# Patient Record
Sex: Male | Born: 1945 | Race: White | Hispanic: No | Marital: Married | Smoking: Former smoker
Health system: Southern US, Community
[De-identification: ages and names within clinical notes are randomized; demographics above are authoritative.]

## PROBLEM LIST (undated history)

## (undated) DIAGNOSIS — C801 Malignant (primary) neoplasm, unspecified: Secondary | ICD-10-CM

## (undated) DIAGNOSIS — T7840XA Allergy, unspecified, initial encounter: Secondary | ICD-10-CM

## (undated) DIAGNOSIS — R29898 Other symptoms and signs involving the musculoskeletal system: Secondary | ICD-10-CM

## (undated) DIAGNOSIS — M549 Dorsalgia, unspecified: Secondary | ICD-10-CM

## (undated) DIAGNOSIS — R200 Anesthesia of skin: Secondary | ICD-10-CM

## (undated) DIAGNOSIS — I10 Essential (primary) hypertension: Secondary | ICD-10-CM

## (undated) HISTORY — DX: Other symptoms and signs involving the musculoskeletal system: R29.898

## (undated) HISTORY — DX: Allergy, unspecified, initial encounter: T78.40XA

## (undated) HISTORY — DX: Anesthesia of skin: R20.0

## (undated) HISTORY — DX: Dorsalgia, unspecified: M54.9

## (undated) HISTORY — PX: COLON SURGERY: SHX602

---

## 1999-01-05 ENCOUNTER — Ambulatory Visit (HOSPITAL_COMMUNITY): Admission: RE | Admit: 1999-01-05 | Discharge: 1999-01-05 | Payer: Self-pay | Admitting: Gastroenterology

## 1999-01-20 ENCOUNTER — Encounter: Payer: Self-pay | Admitting: General Surgery

## 1999-01-22 ENCOUNTER — Ambulatory Visit (HOSPITAL_COMMUNITY): Admission: RE | Admit: 1999-01-22 | Discharge: 1999-01-22 | Payer: Self-pay | Admitting: Gastroenterology

## 1999-01-23 ENCOUNTER — Inpatient Hospital Stay (HOSPITAL_COMMUNITY): Admission: RE | Admit: 1999-01-23 | Discharge: 1999-01-27 | Payer: Self-pay | Admitting: General Surgery

## 2001-11-03 ENCOUNTER — Ambulatory Visit (HOSPITAL_COMMUNITY): Admission: RE | Admit: 2001-11-03 | Discharge: 2001-11-03 | Payer: Self-pay | Admitting: Gastroenterology

## 2001-12-18 ENCOUNTER — Encounter: Payer: Self-pay | Admitting: Cardiology

## 2001-12-18 ENCOUNTER — Ambulatory Visit (HOSPITAL_COMMUNITY): Admission: RE | Admit: 2001-12-18 | Discharge: 2001-12-18 | Payer: Self-pay | Admitting: Cardiology

## 2011-12-18 ENCOUNTER — Encounter (HOSPITAL_COMMUNITY): Payer: Self-pay | Admitting: *Deleted

## 2011-12-18 ENCOUNTER — Emergency Department (HOSPITAL_COMMUNITY)
Admission: EM | Admit: 2011-12-18 | Discharge: 2011-12-18 | Disposition: A | Discharge status: Home / Self Care | Payer: No Typology Code available for payment source | Attending: Emergency Medicine | Admitting: Emergency Medicine

## 2011-12-18 ENCOUNTER — Emergency Department (HOSPITAL_COMMUNITY): Payer: No Typology Code available for payment source

## 2011-12-18 DIAGNOSIS — S0003XA Contusion of scalp, initial encounter: Secondary | ICD-10-CM | POA: Insufficient documentation

## 2011-12-18 DIAGNOSIS — R51 Headache: Secondary | ICD-10-CM | POA: Insufficient documentation

## 2011-12-18 DIAGNOSIS — J3489 Other specified disorders of nose and nasal sinuses: Secondary | ICD-10-CM | POA: Insufficient documentation

## 2011-12-18 DIAGNOSIS — R404 Transient alteration of awareness: Secondary | ICD-10-CM | POA: Insufficient documentation

## 2011-12-18 DIAGNOSIS — F29 Unspecified psychosis not due to a substance or known physiological condition: Secondary | ICD-10-CM | POA: Insufficient documentation

## 2011-12-18 DIAGNOSIS — R413 Other amnesia: Secondary | ICD-10-CM | POA: Insufficient documentation

## 2011-12-18 LAB — URINALYSIS, ROUTINE W REFLEX MICROSCOPIC
Bilirubin Urine: NEGATIVE
Glucose, UA: NEGATIVE mg/dL
Ketones, ur: NEGATIVE mg/dL
pH: 6.5 (ref 5.0–8.0)

## 2011-12-18 LAB — POCT I-STAT, CHEM 8
BUN: 18 mg/dL (ref 6–23)
Calcium, Ion: 1.08 mmol/L — ABNORMAL LOW (ref 1.12–1.32)
Chloride: 106 mEq/L (ref 96–112)
Glucose, Bld: 110 mg/dL — ABNORMAL HIGH (ref 70–99)

## 2011-12-18 LAB — PROTIME-INR: INR: 1.03 (ref 0.00–1.49)

## 2011-12-18 LAB — CBC
HCT: 40.7 % (ref 39.0–52.0)
Hemoglobin: 14.2 g/dL (ref 13.0–17.0)
MCH: 32.6 pg (ref 26.0–34.0)
MCV: 93.6 fL (ref 78.0–100.0)
RBC: 4.35 MIL/uL (ref 4.22–5.81)

## 2011-12-18 LAB — COMPREHENSIVE METABOLIC PANEL
ALT: 35 U/L (ref 0–53)
CO2: 26 mEq/L (ref 19–32)
Calcium: 8.6 mg/dL (ref 8.4–10.5)
GFR calc Af Amer: >90 mL/min (ref >90)
GFR calc non Af Amer: >90 mL/min (ref >90)
Glucose, Bld: 111 mg/dL — ABNORMAL HIGH (ref 70–99)
Sodium: 140 mEq/L (ref 135–145)

## 2011-12-18 MED ORDER — SODIUM CHLORIDE 0.9 % IV BOLUS (SEPSIS): 1000.0000 mL | Freq: Once | INTRAVENOUS | Status: DC

## 2011-12-18 MED ORDER — TETANUS-DIPHTHERIA TOXOIDS TD 5-2 LFU IM INJ
0.5000 mL | INJECTION | Freq: Once | INTRAMUSCULAR | Status: AC
  Administered 2011-12-18: 0.5 mL via INTRAMUSCULAR
  Filled 2011-12-18 (×3): qty 0.5

## 2011-12-18 MED ORDER — SODIUM CHLORIDE 0.9 % IV BOLUS (SEPSIS)
1000.0000 mL | Freq: Once | INTRAVENOUS | Status: AC
  Administered 2011-12-18: 1000 mL via INTRAVENOUS
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
## 2014-06-12 ENCOUNTER — Ambulatory Visit (INDEPENDENT_AMBULATORY_CARE_PROVIDER_SITE_OTHER): Payer: Self-pay | Admitting: Family Medicine

## 2014-06-12 VITALS — BP 124/80 | HR 88 | Temp 97.4°F | Resp 16 | Ht 74.0 in | Wt 216.6 lb

## 2014-06-12 DIAGNOSIS — I10 Essential (primary) hypertension: Secondary | ICD-10-CM | POA: Insufficient documentation

## 2014-06-12 DIAGNOSIS — Z0289 Encounter for other administrative examinations: Secondary | ICD-10-CM

## 2014-06-12 DIAGNOSIS — Z024 Encounter for examination for driving license: Secondary | ICD-10-CM

## 2014-06-12 DIAGNOSIS — E663 Overweight: Secondary | ICD-10-CM

## 2014-06-12 NOTE — Patient Instructions (Signed)
Continue to follow-up with Dr. Rex Kras as directed.   Continue to avoid alcohol prior to driving.
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
## 2015-07-20 ENCOUNTER — Emergency Department (HOSPITAL_COMMUNITY): Payer: Medicare Other

## 2015-07-20 ENCOUNTER — Encounter (HOSPITAL_COMMUNITY): Payer: Self-pay

## 2015-07-20 ENCOUNTER — Emergency Department (HOSPITAL_COMMUNITY)
Admission: EM | Admit: 2015-07-20 | Discharge: 2015-07-21 | Disposition: A | Discharge status: Home / Self Care | Payer: Medicare Other | Attending: Emergency Medicine | Admitting: Emergency Medicine

## 2015-07-20 DIAGNOSIS — Y92007 Garden or yard of unspecified non-institutional (private) residence as the place of occurrence of the external cause: Secondary | ICD-10-CM | POA: Insufficient documentation

## 2015-07-20 DIAGNOSIS — S80212A Abrasion, left knee, initial encounter: Secondary | ICD-10-CM | POA: Diagnosis not present

## 2015-07-20 DIAGNOSIS — W1839XA Other fall on same level, initial encounter: Secondary | ICD-10-CM | POA: Insufficient documentation

## 2015-07-20 DIAGNOSIS — S80211A Abrasion, right knee, initial encounter: Secondary | ICD-10-CM | POA: Diagnosis not present

## 2015-07-20 DIAGNOSIS — S30811A Abrasion of abdominal wall, initial encounter: Secondary | ICD-10-CM | POA: Diagnosis not present

## 2015-07-20 DIAGNOSIS — S8991XA Unspecified injury of right lower leg, initial encounter: Secondary | ICD-10-CM | POA: Diagnosis present

## 2015-07-20 DIAGNOSIS — Z79899 Other long term (current) drug therapy: Secondary | ICD-10-CM | POA: Insufficient documentation

## 2015-07-20 DIAGNOSIS — I10 Essential (primary) hypertension: Secondary | ICD-10-CM | POA: Insufficient documentation

## 2015-07-20 DIAGNOSIS — Y998 Other external cause status: Secondary | ICD-10-CM | POA: Diagnosis not present

## 2015-07-20 DIAGNOSIS — Y9389 Activity, other specified: Secondary | ICD-10-CM | POA: Diagnosis not present

## 2015-07-20 DIAGNOSIS — Z85038 Personal history of other malignant neoplasm of large intestine: Secondary | ICD-10-CM | POA: Diagnosis not present

## 2015-07-20 DIAGNOSIS — W19XXXA Unspecified fall, initial encounter: Secondary | ICD-10-CM

## 2015-07-20 HISTORY — DX: Essential (primary) hypertension: I10

## 2015-07-20 HISTORY — DX: Malignant (primary) neoplasm, unspecified: C80.1

## 2015-07-20 LAB — CBC
HEMATOCRIT: 42.1 % (ref 39.0–52.0)
HEMOGLOBIN: 13.9 g/dL (ref 13.0–17.0)
MCH: 30.5 pg (ref 26.0–34.0)
MCHC: 33 g/dL (ref 30.0–36.0)
MCV: 92.3 fL (ref 78.0–100.0)
Platelets: 162 10*3/uL (ref 150–400)
RBC: 4.56 MIL/uL (ref 4.22–5.81)
RDW: 15.2 % (ref 11.5–15.5)
WBC: 11.2 10*3/uL — ABNORMAL HIGH (ref 4.0–10.5)

## 2015-07-20 LAB — URINALYSIS, ROUTINE W REFLEX MICROSCOPIC
BILIRUBIN URINE: NEGATIVE
Glucose, UA: NEGATIVE mg/dL
HGB URINE DIPSTICK: NEGATIVE
Ketones, ur: NEGATIVE mg/dL
Leukocytes, UA: NEGATIVE
NITRITE: NEGATIVE
PH: 5 (ref 5.0–8.0)
Protein, ur: NEGATIVE mg/dL
SPECIFIC GRAVITY, URINE: 1.015 (ref 1.005–1.030)
UROBILINOGEN UA: 0.2 mg/dL (ref 0.0–1.0)

## 2015-07-20 LAB — BASIC METABOLIC PANEL
ANION GAP: 12 (ref 5–15)
BUN: 18 mg/dL (ref 6–20)
CHLORIDE: 105 mmol/L (ref 101–111)
CO2: 24 mmol/L (ref 22–32)
Calcium: 8.9 mg/dL (ref 8.9–10.3)
Creatinine, Ser: 0.89 mg/dL (ref 0.61–1.24)
GFR calc Af Amer: >60 mL/min (ref >60)
GLUCOSE: 103 mg/dL — AB (ref 65–99)
POTASSIUM: 3.7 mmol/L (ref 3.5–5.1)
Sodium: 141 mmol/L (ref 135–145)

## 2015-07-20 NOTE — ED Notes (Signed)
Pt scooted himself to the edge of the wheelchair with his legs to use the urinal without assisstance.

## 2015-07-20 NOTE — ED Notes (Addendum)
Updated pt and family on results and delay. Family expressing concern over the fact that he "can't walk and can't do anything on his own"

## 2015-07-20 NOTE — ED Notes (Signed)
Pt states he got up off his mower today and both his legs gave out on him. This normally doesn't happen but this is the second time it's happened. States he doesn't feel weak but when he tries to stand up his legs won't hold him up.

## 2015-07-21 NOTE — ED Provider Notes (Signed)
CSN: [id number]     Arrival date & time 07/20/15  1928 History   First MD Initiated Contact with Patient 07/20/15 2346     Chief Complaint  Patient presents with  . Fall     (Consider location/radiation/quality/duration/timing/severity/associated sxs/prior Treatment) Patient is a 69 y.o. male presenting with fall.  Fall This is a new problem. Episode onset: several hours ago. Episode frequency: Once. The problem has been resolved. Pertinent negatives include no chest pain, no abdominal pain, no headaches and no shortness of breath. Associated symptoms comments: Bilateral knee pain. No head injury. No neck pain. No chest pain or shortness of breath. Abrasion to right abdominal wall, but no abdominal pain or tenderness.. Exacerbated by: Standing. Relieved by: Rest.    Past Medical History  Diagnosis Date  . Allergy   . Cancer     colon  . Hypertension    Past Surgical History  Procedure Laterality Date  . Colon surgery     No family history on file. Social History  Substance Use Topics  . Smoking status: Never Smoker   . Smokeless tobacco: None  . Alcohol Use: No    Review of Systems  Respiratory: Negative for shortness of breath.   Cardiovascular: Negative for chest pain.  Gastrointestinal: Negative for abdominal pain.  Neurological: Negative for headaches.  All other systems reviewed and are negative.     Allergies  Codeine and Sulfa antibiotics  Home Medications   Prior to Admission medications   Medication Sig Start Date End Date Taking? Authorizing Provider  lisinopril-hydrochlorothiazide (PRINZIDE,ZESTORETIC) 20-25 MG per tablet Take 1 tablet by mouth daily.    Historical Provider, MD  Multiple Vitamins-Minerals (MULTIVITAMINS THER. W/MINERALS) TABS Take 1 tablet by mouth daily.    Historical Provider, MD  zolpidem (AMBIEN) 10 MG tablet Take 10 mg by mouth at bedtime.    Historical Provider, MD   BP 114/59 mmHg  Pulse 83  Temp(Src) 98.3 F (36.8 C)  Resp  16  Ht 6\' 5"  (1.956 m)  Wt 280 lb (127.007 kg)  BMI 33.20 kg/m2  SpO2 97% Physical Exam  Constitutional: He is oriented to person, place, and time. He appears well-developed and well-nourished. No distress.  HENT:  Head: Normocephalic and atraumatic. Head is without raccoon's eyes and without Battle's sign.  Nose: Nose normal.  Eyes: Conjunctivae and EOM are normal. Pupils are equal, round, and reactive to light. No scleral icterus.  Neck: No spinous process tenderness and no muscular tenderness present.  Cardiovascular: Normal rate, regular rhythm, normal heart sounds and intact distal pulses.   No murmur heard. Pulmonary/Chest: Effort normal and breath sounds normal. He has no rales. He exhibits no tenderness.  Abdominal: Soft. There is no tenderness. There is no rebound and no guarding.  Obese abdomen. Small abrasion to right lower abdominal wall. Not tender there and no tenderness throughout abdomen.  Musculoskeletal: Normal range of motion. He exhibits no edema or tenderness.       Thoracic back: He exhibits no tenderness and no bony tenderness.       Lumbar back: He exhibits no tenderness and no bony tenderness.  No evidence of trauma to extremities, except as noted.  2+ distal pulses.    Neurological: He is alert and oriented to person, place, and time. Gait normal.  Normal strength in bilateral lower extremities  Skin: Skin is warm and dry. No rash noted.  Psychiatric: He has a normal mood and affect.  Nursing note and vitals reviewed.  ED Course  Procedures (including critical care time) Labs Review Labs Reviewed  BASIC METABOLIC PANEL - Abnormal; Notable for the following:    Glucose, Bld 103 (*)    All other components within normal limits  CBC - Abnormal; Notable for the following:    WBC 11.2 (*)    All other components within normal limits  URINALYSIS, ROUTINE W REFLEX MICROSCOPIC (NOT AT [facility])    Imaging Review Dg Knee Complete 4 Views Left  07/20/2015    CLINICAL DATA:  69 year old male with lower extremity weakness and fall  EXAM: LEFT KNEE - COMPLETE 4+ VIEW  COMPARISON:  Right knee radiograph dated 07/20/2015  FINDINGS: There is no evidence of fracture, dislocation, or joint effusion. There is no evidence of arthropathy or other focal bone abnormality. Soft tissues are unremarkable.  IMPRESSION: No acute fracture or dislocation.   Electronically Signed   By: Anner Crete M.D.   On: 07/20/2015 23:27   Dg Knee Complete 4 Views Right  07/20/2015   CLINICAL DATA:  Lower extremity weakness. Patient fell while getting off of lawnmower. Abrasions to the knees.  EXAM: RIGHT KNEE - COMPLETE 4+ VIEW  COMPARISON:  None.  FINDINGS: There is no evidence of fracture, dislocation, or joint effusion. There is no evidence of arthropathy or other focal bone abnormality. Soft tissues are unremarkable.  IMPRESSION: Negative.   Electronically Signed   By: Lucienne Capers M.D.   On: 07/20/2015 23:27   I have personally reviewed and evaluated these images and lab results as part of my medical decision-making.   EKG Interpretation None      MDM   Final diagnoses:  Fall, initial encounter  Knee abrasion, left, initial encounter  Knee abrasion, right, initial encounter    69 year old male who fell after trying to stand up after mowing the yard on a riding lawnmower. His legs gave out on him. This is likely secondary to sitting in a stationary position for long period of time. He now has great strength in his legs, normal sensation, abrasions to both knees without radiographic evidence of fracture, and unremarkable lab work. He was able to ambulate with a steady gait. Stable for discharge home. He will follow-up with his primary doctor as previously scheduled in 2 days. Advised to use a cane if he feels off balance until that time.    Serita Grit, MD 07/21/15 [phone]

## 2015-07-21 NOTE — Discharge Instructions (Signed)
Fall Prevention and Home Safety Falls cause injuries and can affect all age groups. It is possible to use preventive measures to significantly decrease the likelihood of falls. There are many simple measures which can make your home safer and prevent falls. OUTDOORS  Repair cracks and edges of walkways and driveways.  Remove high doorway thresholds.  Trim shrubbery on the main path into your home.  Have good outside lighting.  Clear walkways of tools, rocks, debris, and clutter.  Check that handrails are not broken and are securely fastened. Both sides of steps should have handrails.  Have leaves, snow, and ice cleared regularly.  Use sand or salt on walkways during winter months.  In the garage, clean up grease or oil spills. BATHROOM  Install night lights.  Install grab bars by the toilet and in the tub and shower.  Use non-skid mats or decals in the tub or shower.  Place a plastic non-slip stool in the shower to sit on, if needed.  Keep floors dry and clean up all water on the floor immediately.  Remove soap buildup in the tub or shower on a regular basis.  Secure bath mats with non-slip, double-sided rug tape.  Remove throw rugs and tripping hazards from the floors. BEDROOMS  Install night lights.  Make sure a bedside light is easy to reach.  Do not use oversized bedding.  Keep a telephone by your bedside.  Have a firm chair with side arms to use for getting dressed.  Remove throw rugs and tripping hazards from the floor. KITCHEN  Keep handles on pots and pans turned toward the center of the stove. Use back burners when possible.  Clean up spills quickly and allow time for drying.  Avoid walking on wet floors.  Avoid hot utensils and knives.  Position shelves so they are not too high or low.  Place commonly used objects within easy reach.  If necessary, use a sturdy step stool with a grab bar when reaching.  Keep electrical cables out of the  way.  Do not use floor polish or wax that makes floors slippery. If you must use wax, use non-skid floor wax.  Remove throw rugs and tripping hazards from the floor. STAIRWAYS  Never leave objects on stairs.  Place handrails on both sides of stairways and use them. Fix any loose handrails. Make sure handrails on both sides of the stairways are as long as the stairs.  Check carpeting to make sure it is firmly attached along stairs. Make repairs to worn or loose carpet promptly.  Avoid placing throw rugs at the top or bottom of stairways, or properly secure the rug with carpet tape to prevent slippage. Get rid of throw rugs, if possible.  Have an electrician put in a light switch at the top and bottom of the stairs. OTHER FALL PREVENTION TIPS  Wear low-heel or rubber-soled shoes that are supportive and fit well. Wear closed toe shoes.  When using a stepladder, make sure it is fully opened and both spreaders are firmly locked. Do not climb a closed stepladder.  Add color or contrast paint or tape to grab bars and handrails in your home. Place contrasting color strips on first and last steps.  Learn and use mobility aids as needed. Install an electrical emergency response system.  Turn on lights to avoid dark areas. Replace light bulbs that burn out immediately. Get light switches that glow.  Arrange furniture to create clear pathways. Keep furniture in the same place.  Firmly attach carpet with non-skid or double-sided tape.  Eliminate uneven floor surfaces.  Select a carpet pattern that does not visually hide the edge of steps.  Be aware of all pets. OTHER HOME SAFETY TIPS  Set the water temperature for 120 F (48.8 C).  Keep emergency numbers on or near the telephone.  Keep smoke detectors on every level of the home and near sleeping areas. Document Released: 10/22/2002 Document Revised: 05/02/2012 Document Reviewed: 01/21/2012 [facility] Patient Information 2015  [place], [place]. This information is not intended to replace advice given to you by your health care provider. Make sure you discuss any questions you have with your health care provider.  Abrasion An abrasion is a cut or scrape of the skin. Abrasions do not extend through all layers of the skin and most heal within 10 days. It is important to care for your abrasion properly to prevent infection. CAUSES  Most abrasions are caused by falling on, or gliding across, the ground or other surface. When your skin rubs on something, the outer and inner layer of skin rubs off, causing an abrasion. DIAGNOSIS  Your caregiver will be able to diagnose an abrasion during a physical exam.  TREATMENT  Your treatment depends on how large and deep the abrasion is. Generally, your abrasion will be cleaned with water and a mild soap to remove any dirt or debris. An antibiotic ointment may be put over the abrasion to prevent an infection. A bandage (dressing) may be wrapped around the abrasion to keep it from getting dirty.  You may need a tetanus shot if:  You cannot remember when you had your last tetanus shot.  You have never had a tetanus shot.  The injury broke your skin. If you get a tetanus shot, your arm may swell, get red, and feel warm to the touch. This is common and not a problem. If you need a tetanus shot and you choose not to have one, there is a rare chance of getting tetanus. Sickness from tetanus can be serious.  HOME CARE INSTRUCTIONS   If a dressing was applied, change it at least once a day or as directed by your caregiver. If the bandage sticks, soak it off with warm water.   Wash the area with water and a mild soap to remove all the ointment 2 times a day. Rinse off the soap and pat the area dry with a clean towel.   Reapply any ointment as directed by your caregiver. This will help prevent infection and keep the bandage from sticking. Use gauze over the wound and under the dressing to help  keep the bandage from sticking.   Change your dressing right away if it becomes wet or dirty.   Only take over-the-counter or prescription medicines for pain, discomfort, or fever as directed by your caregiver.   Follow up with your caregiver within 24-48 hours for a wound check, or as directed. If you were not given a wound-check appointment, look closely at your abrasion for redness, swelling, or pus. These are signs of infection. SEEK IMMEDIATE MEDICAL CARE IF:   You have increasing pain in the wound.   You have redness, swelling, or tenderness around the wound.   You have pus coming from the wound.   You have a fever or persistent symptoms for more than 2-3 days.  You have a fever and your symptoms suddenly get worse.  You have a bad smell coming from the wound or dressing.  MAKE SURE  YOU:   Understand these instructions.  Will watch your condition.  Will get help right away if you are not doing well or get worse. Document Released: 08/11/2005 Document Revised: 10/18/2012 Document Reviewed: 10/05/2011 [facility] Patient Information 2015 [place], [place]. This information is not intended to replace advice given to you by your health care provider. Make sure you discuss any questions you have with your health care provider.

## 2015-12-08 ENCOUNTER — Other Ambulatory Visit: Payer: Self-pay | Admitting: Gastroenterology

## 2016-02-02 ENCOUNTER — Encounter: Payer: Self-pay | Admitting: Neurology

## 2016-02-02 ENCOUNTER — Ambulatory Visit (INDEPENDENT_AMBULATORY_CARE_PROVIDER_SITE_OTHER): Payer: Medicare Other | Admitting: Neurology

## 2016-02-02 VITALS — BP 125/79 | HR 75 | Ht 77.0 in | Wt 306.0 lb

## 2016-02-02 DIAGNOSIS — R269 Unspecified abnormalities of gait and mobility: Secondary | ICD-10-CM

## 2016-02-02 DIAGNOSIS — M545 Low back pain, unspecified: Secondary | ICD-10-CM | POA: Insufficient documentation

## 2016-02-02 DIAGNOSIS — M5441 Lumbago with sciatica, right side: Secondary | ICD-10-CM | POA: Diagnosis not present

## 2016-02-02 NOTE — Progress Notes (Addendum)
PATIENT: Jose Fitzpatrick DOB: [birth year]  Chief Complaint  Patient presents with  . Right Leg Weakness    He is here with his daughter, Jose Fitzpatrick.  Reports low back pain and weakness in his right leg that gets worse the longer he is on it.  He is also having numbness on the bottom of his right foot. He has had a doppler in both legs to rule out clots.     HISTORICAL  Jose Fitzpatrick is a 70 years old right-handed male, accompanied by his daughter Jose Fitzpatrick, seen in refer by his primary care physician Dr. Tamsen Roers for evaluation of right leg weakness, numbness at the bottom of right foot in February 02 2016.    I reviewed and summarized the referring note, he had a history of hypertension, obesity with BMI of 40, history of colon cancer around 2002, with partial colectomy, but did not require chemotherapy or radiation therapy.  He Recently had Doppler study of bilateral lower extremity, there was no significant vascular abnormality noticed  He used to do heavy lifting, had janitorial business later on, complained of low back pain since he was 70 years old, intermittent, since 2016, he was noted to have worsening gait difficulty, need to push on chair arm to get up from seated position, right leg especially right plantar foot numbness, "sometimes I do not know I have my right foot", he has fell 3 times since the summer of 2016, is always his right foot trip on something, he is still very active, walk couple miles a day without significant difficulty, does complains bilateral lower extremity heaviness with prolonged walking, no left leg complaints, no bilateral upper extremity paresthesia or weakness, he denies bowel and bladder incontinence.   REVIEW OF SYSTEMS: Full 14 system review of systems performed and notable only for Numbness, weakness, He snores a lot  ALLERGIES: Allergies  Allergen Reactions  . Codeine     Upset stomach   . Sulfa Antibiotics Hives    HOME  MEDICATIONS: Current Outpatient Prescriptions  Medication Sig Dispense Refill  . lisinopril-hydrochlorothiazide (PRINZIDE,ZESTORETIC) 20-25 MG per tablet Take 1 tablet by mouth daily.    . Multiple Vitamins-Minerals (MULTIVITAMINS THER. W/MINERALS) TABS Take 1 tablet by mouth daily.    Marland Kitchen zolpidem (AMBIEN) 10 MG tablet Take 10 mg by mouth at bedtime.     No current facility-administered medications for this visit.    PAST MEDICAL HISTORY: Past Medical History  Diagnosis Date  . Allergy   . Cancer ([place])     colon  . Hypertension   . Back pain   . Weakness of right lower extremity   . Numbness     PAST SURGICAL HISTORY: Past Surgical History  Procedure Laterality Date  . Colon surgery      FAMILY HISTORY: Family History  Problem Relation Age of Onset  . Heart disease Mother   . Hypertension Mother     SOCIAL HISTORY:  Social History   Social History  . Marital Status: Married    Spouse Name: N/A  . Number of Children: 3  . Years of Education: HS   Occupational History  . Retired    Social History Main Topics  . Smoking status: Former Research scientist (life sciences)  . Smokeless tobacco: Not on file     Comment: Quit 30+ years ago.  . Alcohol Use: 0.0 oz/week    0 Standard drinks or equivalent per week     Comment: Rarely  . Drug Use: No  .  Sexual Activity: Not on file   Other Topics Concern  . Not on file   Social History Narrative   Lives at home with his wife.   Right-handed.   2 cups caffeine daily.     PHYSICAL EXAM   Filed Vitals:   02/02/16 1534  BP: 125/79  Pulse: 75  Height: 6\' 5"  (1.956 m)  Weight: 306 lb (138.801 kg)    Not recorded      Body mass index is 36.28 kg/(m^2).  PHYSICAL EXAMNIATION:  Gen: NAD, conversant, well nourised, obese, well groomed                     Cardiovascular: Regular rate rhythm, no peripheral edema, warm, nontender. Eyes: Conjunctivae clear without exudates or hemorrhage Neck: Supple, no carotid bruise. Pulmonary:  Clear to auscultation bilaterally   NEUROLOGICAL EXAM:  MENTAL STATUS: Speech:    Speech is normal; fluent and spontaneous with normal comprehension.  Cognition:     Orientation to time, place and person     Normal recent and remote memory     Normal Attention span and concentration     Normal Language, naming, repeating,spontaneous speech     Fund of knowledge   CRANIAL NERVES: CN II: Visual fields are full to confrontation. Fundoscopic exam is normal with sharp discs and no vascular changes. Pupils are round equal and briskly reactive to light. CN III, IV, VI: extraocular movement are normal. No ptosis. CN V: Facial sensation is intact to pinprick in all 3 divisions bilaterally. Corneal responses are intact.  CN VII: Face is symmetric with normal eye closure and smile. CN VIII: Hearing is normal to rubbing fingers CN IX, X: Palate elevates symmetrically. Phonation is normal. CN XI: Head turning and shoulder shrug are intact CN XII: Tongue is midline with normal movements and no atrophy.  MOTOR: There is no pronator drift of out-stretched arms. Muscle bulk and tone are normal. He has mild bilateral hip flexion weakness,  mild bilateral ankle dorsiflexion weakness, right worse than left,   REFLEXES: Reflexes are 1 and symmetric at the biceps, triceps, knees, and absent at ankles. Plantar responses are flexor.  SENSORY: Length dependent decreased to light touch, pinprick, vibratory sensation at toes  COORDINATION: Rapid alternating movements and fine finger movements are intact. There is no dysmetria on finger-to-nose and heel-knee-shin.    GAIT/STANCE: Need to push up to get up from seated position, bilateral foot drop, mildly unsteady, right worse than left,   DIAGNOSTIC DATA (LABS, IMAGING, TESTING) - I reviewed patient records, labs, notes, testing and imaging myself where available.   ASSESSMENT AND PLAN  Jose Fitzpatrick is a 70 y.o. male   Low back pain, gait  difficulty, distal leg weakness   most consistent with lumbar stenosis  Proceed with MRI of lumbar    EMG nerve conduction study  I encouraged him water aerobic, weight loss,  Obstructive sleep apnea  He snores a lot, has narrow oropharyngeal  Marcial Pacas, M.D. Ph.D.  [facility] [address] [place], [place] [address] Ph: [phone] Fax: [phone]  [id number] Little, MD

## 2016-02-07 ENCOUNTER — Ambulatory Visit
Admission: RE | Admit: 2016-02-07 | Discharge: 2016-02-07 | Disposition: A | Discharge status: Home / Self Care | Payer: Medicare Other | Source: Ambulatory Visit | Attending: Neurology | Admitting: Neurology

## 2016-02-07 DIAGNOSIS — M5441 Lumbago with sciatica, right side: Secondary | ICD-10-CM

## 2016-02-07 DIAGNOSIS — R269 Unspecified abnormalities of gait and mobility: Secondary | ICD-10-CM | POA: Diagnosis not present

## 2016-02-10 ENCOUNTER — Telehealth: Payer: Self-pay | Admitting: Neurology

## 2016-02-10 NOTE — Telephone Encounter (Signed)
Spoke to his wife on [employer] - she is aware of results - he will keep his pending appts.

## 2016-02-10 NOTE — Telephone Encounter (Signed)
Please call patient, MRI of the lumbar showed evidence of degenerative changes, most severe at L4-5, I will go over imaging findings with him in detail at next follow-up visit in February 18 2016  IMPRESSION: This MRI of the lumbar spine without contrast shows the following: 1. At L4-L5, there is disc protrusion with a more focal right paramedian superimposed protrusion, endplate spurring and facet hypertrophy causing mild transverse stenosis of the central canal, moderate left foraminal narrowing and moderate right foraminal narrowing. There is no definite nerve root compression though there is some encroachment upon the left L4 and right L5 nerve roots. 2. Degenerative changes at the other lumbar levels, as detailed above, are less likely to lead to nerve root impingement. 3. There are no acute findings.

## 2016-02-18 ENCOUNTER — Ambulatory Visit (INDEPENDENT_AMBULATORY_CARE_PROVIDER_SITE_OTHER): Payer: Medicare Other | Admitting: Neurology

## 2016-02-18 DIAGNOSIS — M5441 Lumbago with sciatica, right side: Secondary | ICD-10-CM

## 2016-02-18 DIAGNOSIS — G6289 Other specified polyneuropathies: Secondary | ICD-10-CM | POA: Diagnosis not present

## 2016-02-18 DIAGNOSIS — G629 Polyneuropathy, unspecified: Secondary | ICD-10-CM | POA: Insufficient documentation

## 2016-02-18 DIAGNOSIS — R269 Unspecified abnormalities of gait and mobility: Secondary | ICD-10-CM

## 2016-02-18 NOTE — Progress Notes (Signed)
PATIENT: Jose Fitzpatrick DOB: 07-04-1946  No chief complaint on file.    HISTORICAL  Jose Fitzpatrick is a 70 years old right-handed male, accompanied by his daughter Jose Fitzpatrick, seen in refer by his primary care physician Dr. Tamsen Roers for evaluation of right leg weakness, numbness at the bottom of right foot in February 02 2016.    I reviewed and summarized the referring note, he had a history of hypertension, obesity with BMI of 40, history of colon cancer around 2002, with partial colectomy, but did not require chemotherapy or radiation therapy.  He Recently had Doppler study of bilateral lower extremity, there was no significant vascular abnormality noticed  He used to do heavy lifting, had janitorial business later on, complained of low back pain since he was 70 years old, intermittent, since 2016, he was noted to have worsening gait difficulty, need to push on chair arm to get up from seated position, right leg especially right plantar foot numbness, "sometimes I do not know I have my right foot", he has fell 3 times since the summer of 2016, is always his right foot trip on something, he is still very active, walk couple miles a day without significant difficulty, does complains bilateral lower extremity heaviness with prolonged walking, no left leg complaints, no bilateral upper extremity paresthesia or weakness, he denies bowel and bladder incontinence.  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  He complains of right foot numbness, sometimes right leg give out on him, midline low back pain, mild unsteady gait. He also had a history of right big toe fracture in the past, seems to has mild right toe extension weakness.  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 There is no definite nerve root compression though there is some encroachment upon the left L4 and right L5 nerve roots.  I reviewed laboratory evaluation 2016, elevated glucose 103, otherwise normal CMP, CBC,    REVIEW OF SYSTEMS: Full 14 system review of systems performed and notable only for Numbness, weakness, He snores a lot  ALLERGIES: Allergies  Allergen Reactions  . Codeine     Upset stomach   . Sulfa Antibiotics Hives    HOME MEDICATIONS: Current Outpatient Prescriptions  Medication Sig Dispense Refill  . lisinopril-hydrochlorothiazide (PRINZIDE,ZESTORETIC) 20-25 MG per tablet Take 1 tablet by mouth daily.    . Multiple Vitamins-Minerals (MULTIVITAMINS THER. W/MINERALS) TABS Take 1 tablet by mouth daily.    Marland Kitchen zolpidem (AMBIEN) 10 MG tablet Take 10 mg by mouth at bedtime.     No current facility-administered medications for this visit.    PAST MEDICAL HISTORY: Past Medical History  Diagnosis Date  . Allergy   . Cancer ([place])     colon  . Hypertension   . Back pain   . Weakness of right lower extremity   . Numbness     PAST SURGICAL HISTORY: Past Surgical History  Procedure Laterality Date  . Colon surgery      FAMILY HISTORY: Family History  Problem Relation Age of Onset  . Heart disease Mother   .  Hypertension Mother     SOCIAL HISTORY:  Social History   Social History  . Marital Status: Married    Spouse Name: N/A  . Number of Children: 3  . Years of Education: HS   Occupational History  . Retired    Social History Main Topics  . Smoking status: Former Research scientist (life sciences)  . Smokeless tobacco: Not on file     Comment: Quit 30+ years ago.  . Alcohol Use: 0.0  oz/week    0 Standard drinks or equivalent per week     Comment: Rarely  . Drug Use: No  . Sexual Activity: Not on file   Other Topics Concern  . Not on file   Social History Narrative   Lives at home with his wife.   Right-handed.   2 cups caffeine daily.     PHYSICAL EXAM   There were no vitals filed for this visit.  Not recorded      There is no weight on file to calculate BMI.  PHYSICAL EXAMNIATION:  Gen: NAD, conversant, well nourised, obese, well groomed                     Cardiovascular: Regular rate rhythm, no peripheral edema, warm, nontender. Eyes: Conjunctivae clear without exudates or hemorrhage Neck: Supple, no carotid bruise. Pulmonary: Clear to auscultation bilaterally   NEUROLOGICAL EXAM:  MENTAL STATUS: Speech:    Speech is normal; fluent and spontaneous with normal comprehension.  Cognition:     Orientation to time, place and person     Normal recent and remote memory     Normal Attention span and concentration     Normal Language, naming, repeating,spontaneous speech     Fund of knowledge   CRANIAL NERVES: CN II: Visual fields are full to confrontation. Fundoscopic exam is normal with sharp discs and no vascular changes. Pupils are round equal and briskly reactive to light. CN III, IV, VI: extraocular movement are normal. No ptosis. CN V: Facial sensation is intact to pinprick in all 3 divisions bilaterally. Corneal responses are intact.  CN VII: Face is symmetric with normal eye closure and smile. CN VIII: Hearing is normal to rubbing fingers CN IX, X: Palate elevates symmetrically. Phonation is normal. CN XI: Head turning and shoulder shrug are intact CN XII: Tongue is midline with normal movements and no atrophy.  MOTOR: There is no pronator drift of out-stretched arms. Muscle bulk and tone are normal. He has mild bilateral hip flexion weakness,  mild bilateral ankle dorsiflexion weakness, right worse than left,   REFLEXES: Reflexes  are 1 and symmetric at the biceps, triceps, knees, and absent at ankles. Plantar responses are flexor.  SENSORY: Length dependent decreased to light touch, pinprick, vibratory sensation at toes  COORDINATION: Rapid alternating movements and fine finger movements are intact. There is no dysmetria on finger-to-nose and heel-knee-shin.    GAIT/STANCE: Need to push up to get up from seated position, difficulty performing tiptoe and heel walking, right worse than left   DIAGNOSTIC DATA (LABS, IMAGING, TESTING) - I reviewed patient records, labs, notes, testing and imaging myself where available.   ASSESSMENT AND PLAN  Jose Fitzpatrick is a 70 y.o. male   Gait abnormality  Multifactorial, evidence of bilateral lumbosacral radiculopathy, mild peripheral neuropathy, overweight   I have suggested him continue moderate exercise   Peripheral neuropathy:   Had a history of abnormal glucose,   Laboratory evaluation to rule out treatable etiology, includes A1c, TSH, B12  inflammatory markers   Excessive daytime sleepiness, snoring,   He is at high risk for developing obstructive sleep apnea, with narrow oropharyngeal, if he remains symptomatic consider sleep study   Jose Fitzpatrick, M.D. Ph.D.  [facility] [address], [place], [place] [address] Ph: [phone] Fax: [phone]  [id number] Little, Jose Fitzpatrick

## 2016-02-18 NOTE — Procedures (Signed)
   NCS (NERVE CONDUCTION STUDY) WITH EMG (ELECTROMYOGRAPHY) REPORT   STUDY DATE: February 18 2016 PATIENT NAME: Jose Fitzpatrick DOB: 09/13/1946 MRN: [id number]    TECHNOLOGIST: Laretta Alstrom ELECTROMYOGRAPHER: Marcial Pacas M.D.  CLINICAL INFORMATION: 70 years old male with history of low back pain, right foot numbness, mildly unsteady gait  FINDINGS: NERVE CONDUCTION STUDY: Bilateral peroneal sensory responses were absent. Bilateral peroneal motor responses showed mildly decreased to C map amplitude, with normal distal latency, conduction velocity. Bilateral tibial motor responses showed severely decreased C map amplitude, with normal distal latency, conduction velocity. Bilateral tibial H reflexes were absent.  Left median, ulnar sensory and motor responses were normal.  NEEDLE ELECTROMYOGRAPHY: Selective needle examinations were performed at bilateral lower extremity muscles bilateral lumbar sacral paraspinal muscles.  Right tibialis anterior, peroneal longus, medial gastrocnemius: Increased insertional activity, no spontaneous activity, mildly enlarged motor unit potential, with mildly decreased recruitment patterns.  Right biceps femoris long head, vastus lateralis, gluteus medius: Normal insertion activity, no spontaneous activity, normal morphology motor unit potential, mildly decreased recruitment patterns.  Left tibialis anterior, medial gastrocnemius, vastus lateralis: Normal insertion activity, no spontaneous activity, normal morphology motor unit potential, with mildly decreased recruitment patterns.  Left biceps femoris long head: Normal insertion activity, no spontaneous activity, normal morphology motor unit potential, with normal recruitment patterns.  There was no spontaneous activity at bilateral lumbar sacral paraspinal muscles.  IMPRESSION:   This is an abnormal study. There is electrodiagnostic evidence of mild length dependent axonal peripheral neuropathy. There  is also evidence of chronic bilateral lumbar sacral radiculopathies, mainly involving bilateral L4, L5, S1 myotomes, right worse than left. There is no evidence of active process.   INTERPRETING PHYSICIAN:   Marcial Pacas M.D. Ph.D. [facility] [address], [place], [place] [address] [phone]

## 2016-02-19 ENCOUNTER — Telehealth: Payer: Self-pay | Admitting: Neurology

## 2016-02-19 LAB — SEDIMENTATION RATE: Sed Rate: 2 mm/hr (ref 0–30)

## 2016-02-19 LAB — CK: Total CK: 244 U/L — ABNORMAL HIGH (ref 24–204)

## 2016-02-19 LAB — THYROID PANEL WITH TSH
Free Thyroxine Index: 1.3 (ref 1.2–4.9)
T3 UPTAKE RATIO: 32 % (ref 24–39)
T4 TOTAL: 4.1 ug/dL — AB (ref 4.5–12.0)
TSH: 3.77 u[IU]/mL (ref 0.450–4.500)

## 2016-02-19 LAB — C-REACTIVE PROTEIN: CRP: 2.5 mg/L (ref 0.0–4.9)

## 2016-02-19 LAB — ANA W/REFLEX IF POSITIVE: ANA: NEGATIVE

## 2016-02-19 LAB — HGB A1C W/O EAG: Hgb A1c MFr Bld: 5.8 % — ABNORMAL HIGH (ref 4.8–5.6)

## 2016-02-19 LAB — VITAMIN B12: VITAMIN B 12: 640 pg/mL (ref 211–946)

## 2016-02-19 NOTE — Telephone Encounter (Signed)
Please call patient, laboratory evaluation showed mild elevated A1c 5.8, normal should be less than 5.7, mild elevated CPK of unknown clinical significance, rest of the laboratory evaluations were normal.

## 2016-02-19 NOTE — Telephone Encounter (Signed)
Spoke to patient he is aware of results.

## 2016-08-18 ENCOUNTER — Ambulatory Visit: Payer: Medicare Other | Admitting: Neurology

## 2021-01-09 ENCOUNTER — Other Ambulatory Visit: Payer: Self-pay

## 2021-01-09 DIAGNOSIS — M7989 Other specified soft tissue disorders: Secondary | ICD-10-CM

## 2021-01-13 ENCOUNTER — Ambulatory Visit: Payer: Medicare HMO | Admitting: Vascular Surgery

## 2021-01-13 ENCOUNTER — Ambulatory Visit (HOSPITAL_COMMUNITY)
Admission: RE | Admit: 2021-01-13 | Discharge: 2021-01-13 | Disposition: A | Discharge status: Home / Self Care | Payer: Medicare HMO | Source: Ambulatory Visit | Attending: Vascular Surgery | Admitting: Vascular Surgery

## 2021-01-13 ENCOUNTER — Other Ambulatory Visit: Payer: Self-pay

## 2021-01-13 ENCOUNTER — Encounter: Payer: Self-pay | Admitting: Vascular Surgery

## 2021-01-13 DIAGNOSIS — I872 Venous insufficiency (chronic) (peripheral): Secondary | ICD-10-CM

## 2021-01-13 DIAGNOSIS — M7989 Other specified soft tissue disorders: Secondary | ICD-10-CM | POA: Diagnosis not present

## 2021-01-13 DIAGNOSIS — I890 Lymphedema, not elsewhere classified: Secondary | ICD-10-CM | POA: Insufficient documentation

## 2021-01-13 NOTE — Progress Notes (Signed)
Patient name: Jose Fitzpatrick MRN: [id number] DOB: [birth year] Sex: male  REASON FOR CONSULT: Left leg swelling  HPI: Jose Fitzpatrick is a 75 y.o. male, with history of HTN and morbid obesity who presents for evaluation of lower extremity edema worse in the left leg.  Patient states he has had swelling for long period of time.  He feels the left leg has gotten a lot worse recently and he has noticed a lot of fluid weeping from the leg itself.  He has been to [place] to elastic therapy to get knee-high compression stockings that were appropriately sized.  He has similar swelling in the legs now that he cannot even get his stockings on over the last 3 - 4 weeks.  Denies any history of DVT or trauma to the leg.  Denies any history of heart failure or renal failure  Past Medical History:  Diagnosis Date  . Allergy   . Back pain   . Cancer ([place])    colon  . Hypertension   . Numbness   . Weakness of right lower extremity     Past Surgical History:  Procedure Laterality Date  . COLON SURGERY      Family History  Problem Relation Age of Onset  . Heart disease Mother   . Hypertension Mother     SOCIAL HISTORY: Social History   Socioeconomic History  . Marital status: Married    Spouse name: Not on file  . Number of children: 3  . Years of education: HS  . Highest education level: Not on file  Occupational History  . Occupation: Retired  Tobacco Use  . Smoking status: Former Research scientist (life sciences)  . Smokeless tobacco: Never Used  . Tobacco comment: Quit 30+ years ago.  Substance and Sexual Activity  . Alcohol use: Yes    Alcohol/week: 0.0 standard drinks    Comment: Rarely  . Drug use: No  . Sexual activity: Not on file  Other Topics Concern  . Not on file  Social History Narrative   Lives at home with his wife.   Right-handed.   2 cups caffeine daily.   Social Determinants of Health   Financial Resource Strain: Not on file  Food Insecurity: Not on file  Transportation  Needs: Not on file  Physical Activity: Not on file  Stress: Not on file  Social Connections: Not on file  Intimate Partner Violence: Not on file    Allergies  Allergen Reactions  . Codeine     Upset stomach   . Sulfa Antibiotics Hives and Rash    Current Outpatient Medications  Medication Sig Dispense Refill  . lisinopril-hydrochlorothiazide (PRINZIDE,ZESTORETIC) 20-25 MG per tablet Take 1 tablet by mouth daily.    . Multiple Vitamins-Minerals (MULTIVITAMINS THER. W/MINERALS) TABS Take 1 tablet by mouth daily.    Marland Kitchen zolpidem (AMBIEN) 10 MG tablet Take 10 mg by mouth at bedtime.     No current facility-administered medications for this visit.    REVIEW OF SYSTEMS:  [X]  denotes positive finding, [ ]  denotes negative finding Cardiac  Comments:  Chest pain or chest pressure:    Shortness of breath upon exertion:    Short of breath when lying flat:    Irregular heart rhythm:        Vascular    Pain in calf, thigh, or hip brought on by ambulation:    Pain in feet at night that wakes you up from your sleep:     Blood clot in  your veins:    Leg swelling:  x       Pulmonary    Oxygen at home:    Productive cough:     Wheezing:         Neurologic    Sudden weakness in arms or legs:     Sudden numbness in arms or legs:     Sudden onset of difficulty speaking or slurred speech:    Temporary loss of vision in one eye:     Problems with dizziness:         Gastrointestinal    Blood in stool:     Vomited blood:         Genitourinary    Burning when urinating:     Blood in urine:        Psychiatric    Major depression:         Hematologic    Bleeding problems:    Problems with blood clotting too easily:        Skin    Rashes or ulcers:        Constitutional    Fever or chills:      PHYSICAL EXAM: Vitals:   01/13/21 1116  BP: (!) 155/89  Pulse: 90  Resp: 18  Temp: 98.2 F (36.8 C)  TempSrc: Temporal  SpO2: 94%  Weight: (!) 392 lb (177.8 kg)  Height: 6'  3" (1.905 m)    GENERAL: The patient is a well-nourished male, in no acute distress. The vital signs are documented above. CARDIAC: There is a regular rate and rhythm.  VASCULAR:  Difficult to appreciate any pedal pulses given extent of edema Significant bilateral lower extremity edema with weeping from the left leg as well as hyperpigmentation hyperkeratosis as pictured below, edema does extend out onto the foot and toes PULMONARY: There is good air exchange bilaterally without wheezing or rales. ABDOMEN: Soft and non-tender with normal pitched bowel sounds.  MUSCULOSKELETAL: There are no major deformities or cyanosis. NEUROLOGIC: No focal weakness or paresthesias are detected. SKIN: There are no ulcers or rashes noted. PSYCHIATRIC: The patient has a normal affect.      DATA:   Lower extremity reflux study shows no reflux in the right leg and only deep reflux in the left leg in the common femoral vein  Assessment/Plan:  75 year old male presents with bilateral lower extremity edema and swelling as seen above in the picture.  His reflux study interestingly shows no reflux in the right leg and only deep reflux in the left common femoral vein.  I discussed with only reflux in the deep system in the left leg this is not amendable to surgical intervention.  Discussed typically surgical intervention is reserved for superficial venous reflux.  We discussed the importance of leg elevation, exercise, and compression.  He has already been to elastic therapy in [place] and has been appropriately sized for compression therapy stockings that he unfortunately cannot get on at this time due to the swelling.  I did give him Ace wraps and discussed with his granddaughter trying to wrap his legs with acewraps to try and improve the edema and hopefully transition back to his compression socks.  I will have him follow-up in 3 months in the [place] [facility].  I am suspicious that a lot of this is more consistent with  lymphedema and he may be a candidate for lymphedema pump if no improvement.  I also discussed with him the importance of weight loss and his obesity  as a major contributing factor to his lower extremity swelling.   Marty Heck, MD [facility] Office: [phone]

## 2021-01-19 ENCOUNTER — Telehealth: Payer: Self-pay

## 2021-01-19 NOTE — Telephone Encounter (Signed)
Pt's granddaughter, Feliberto Harts called to ask if MD felt he would be a candidate for a lymphedema therapist. After pt's appt last week they were looking into this and found that there was one located in their town. I have left MD a message and will f/u with pt once we hear back. Courtney verbalized understanding and has no further questions/concerns at this time.

## 2021-01-19 NOTE — Telephone Encounter (Signed)
Per MD, pt can go to lymphedema therapist. I have let pt's granddaughter Loma Sousa who called [place] know and she is going to try and reach out to therapy center for an appt. Our office will work on providing a referral, if necessary. No further questions/concerns at this time.

## 2021-03-24 ENCOUNTER — Inpatient Hospital Stay (HOSPITAL_COMMUNITY)
Admission: EM | Admit: 2021-03-24 | Discharge: 2021-05-15 | DRG: 308 | Disposition: E | Payer: Medicare HMO | Attending: Internal Medicine | Admitting: Internal Medicine

## 2021-03-24 ENCOUNTER — Emergency Department (HOSPITAL_COMMUNITY): Payer: Medicare HMO

## 2021-03-24 ENCOUNTER — Encounter (HOSPITAL_COMMUNITY): Payer: Self-pay | Admitting: Emergency Medicine

## 2021-03-24 ENCOUNTER — Other Ambulatory Visit: Payer: Self-pay

## 2021-03-24 DIAGNOSIS — J9602 Acute respiratory failure with hypercapnia: Secondary | ICD-10-CM | POA: Diagnosis present

## 2021-03-24 DIAGNOSIS — I509 Heart failure, unspecified: Secondary | ICD-10-CM

## 2021-03-24 DIAGNOSIS — R410 Disorientation, unspecified: Secondary | ICD-10-CM

## 2021-03-24 DIAGNOSIS — Z87891 Personal history of nicotine dependence: Secondary | ICD-10-CM

## 2021-03-24 DIAGNOSIS — Z20822 Contact with and (suspected) exposure to covid-19: Secondary | ICD-10-CM | POA: Diagnosis present

## 2021-03-24 DIAGNOSIS — I10 Essential (primary) hypertension: Secondary | ICD-10-CM | POA: Diagnosis not present

## 2021-03-24 DIAGNOSIS — E873 Alkalosis: Secondary | ICD-10-CM | POA: Diagnosis not present

## 2021-03-24 DIAGNOSIS — R7989 Other specified abnormal findings of blood chemistry: Secondary | ICD-10-CM | POA: Diagnosis not present

## 2021-03-24 DIAGNOSIS — Z515 Encounter for palliative care: Secondary | ICD-10-CM

## 2021-03-24 DIAGNOSIS — E876 Hypokalemia: Secondary | ICD-10-CM | POA: Diagnosis not present

## 2021-03-24 DIAGNOSIS — E662 Morbid (severe) obesity with alveolar hypoventilation: Secondary | ICD-10-CM | POA: Diagnosis present

## 2021-03-24 DIAGNOSIS — I4892 Unspecified atrial flutter: Secondary | ICD-10-CM | POA: Diagnosis present

## 2021-03-24 DIAGNOSIS — I959 Hypotension, unspecified: Secondary | ICD-10-CM | POA: Diagnosis not present

## 2021-03-24 DIAGNOSIS — I4891 Unspecified atrial fibrillation: Secondary | ICD-10-CM

## 2021-03-24 DIAGNOSIS — J9601 Acute respiratory failure with hypoxia: Secondary | ICD-10-CM | POA: Diagnosis present

## 2021-03-24 DIAGNOSIS — I5023 Acute on chronic systolic (congestive) heart failure: Secondary | ICD-10-CM | POA: Diagnosis not present

## 2021-03-24 DIAGNOSIS — I4819 Other persistent atrial fibrillation: Principal | ICD-10-CM | POA: Diagnosis present

## 2021-03-24 DIAGNOSIS — I872 Venous insufficiency (chronic) (peripheral): Secondary | ICD-10-CM | POA: Diagnosis present

## 2021-03-24 DIAGNOSIS — E038 Other specified hypothyroidism: Secondary | ICD-10-CM | POA: Diagnosis present

## 2021-03-24 DIAGNOSIS — J9692 Respiratory failure, unspecified with hypercapnia: Secondary | ICD-10-CM | POA: Diagnosis present

## 2021-03-24 DIAGNOSIS — Z66 Do not resuscitate: Secondary | ICD-10-CM | POA: Diagnosis present

## 2021-03-24 DIAGNOSIS — I5021 Acute systolic (congestive) heart failure: Secondary | ICD-10-CM | POA: Diagnosis not present

## 2021-03-24 DIAGNOSIS — T502X5A Adverse effect of carbonic-anhydrase inhibitors, benzothiadiazides and other diuretics, initial encounter: Secondary | ICD-10-CM | POA: Diagnosis not present

## 2021-03-24 DIAGNOSIS — I5020 Unspecified systolic (congestive) heart failure: Secondary | ICD-10-CM | POA: Diagnosis not present

## 2021-03-24 DIAGNOSIS — F1010 Alcohol abuse, uncomplicated: Secondary | ICD-10-CM | POA: Diagnosis present

## 2021-03-24 DIAGNOSIS — N179 Acute kidney failure, unspecified: Secondary | ICD-10-CM | POA: Diagnosis not present

## 2021-03-24 DIAGNOSIS — I428 Other cardiomyopathies: Secondary | ICD-10-CM | POA: Diagnosis present

## 2021-03-24 DIAGNOSIS — Z85038 Personal history of other malignant neoplasm of large intestine: Secondary | ICD-10-CM

## 2021-03-24 DIAGNOSIS — R0602 Shortness of breath: Secondary | ICD-10-CM

## 2021-03-24 DIAGNOSIS — I472 Ventricular tachycardia: Secondary | ICD-10-CM | POA: Diagnosis not present

## 2021-03-24 DIAGNOSIS — L89156 Pressure-induced deep tissue damage of sacral region: Secondary | ICD-10-CM | POA: Diagnosis not present

## 2021-03-24 DIAGNOSIS — I110 Hypertensive heart disease with heart failure: Secondary | ICD-10-CM | POA: Diagnosis present

## 2021-03-24 DIAGNOSIS — L89812 Pressure ulcer of head, stage 2: Secondary | ICD-10-CM | POA: Diagnosis present

## 2021-03-24 DIAGNOSIS — G629 Polyneuropathy, unspecified: Secondary | ICD-10-CM | POA: Diagnosis present

## 2021-03-24 DIAGNOSIS — E871 Hypo-osmolality and hyponatremia: Secondary | ICD-10-CM | POA: Diagnosis not present

## 2021-03-24 DIAGNOSIS — Z8249 Family history of ischemic heart disease and other diseases of the circulatory system: Secondary | ICD-10-CM

## 2021-03-24 DIAGNOSIS — I9589 Other hypotension: Secondary | ICD-10-CM | POA: Diagnosis not present

## 2021-03-24 DIAGNOSIS — E875 Hyperkalemia: Secondary | ICD-10-CM | POA: Diagnosis not present

## 2021-03-24 DIAGNOSIS — D7589 Other specified diseases of blood and blood-forming organs: Secondary | ICD-10-CM | POA: Diagnosis present

## 2021-03-24 DIAGNOSIS — F40240 Claustrophobia: Secondary | ICD-10-CM | POA: Diagnosis not present

## 2021-03-24 DIAGNOSIS — Z882 Allergy status to sulfonamides status: Secondary | ICD-10-CM

## 2021-03-24 DIAGNOSIS — L8990 Pressure ulcer of unspecified site, unspecified stage: Secondary | ICD-10-CM | POA: Insufficient documentation

## 2021-03-24 DIAGNOSIS — R0600 Dyspnea, unspecified: Secondary | ICD-10-CM

## 2021-03-24 DIAGNOSIS — I340 Nonrheumatic mitral (valve) insufficiency: Secondary | ICD-10-CM | POA: Diagnosis not present

## 2021-03-24 DIAGNOSIS — Z6841 Body Mass Index (BMI) 40.0 and over, adult: Secondary | ICD-10-CM | POA: Diagnosis not present

## 2021-03-24 DIAGNOSIS — Z885 Allergy status to narcotic agent status: Secondary | ICD-10-CM

## 2021-03-24 DIAGNOSIS — J810 Acute pulmonary edema: Secondary | ICD-10-CM | POA: Diagnosis not present

## 2021-03-24 DIAGNOSIS — R627 Adult failure to thrive: Secondary | ICD-10-CM | POA: Diagnosis present

## 2021-03-24 DIAGNOSIS — I5041 Acute combined systolic (congestive) and diastolic (congestive) heart failure: Secondary | ICD-10-CM | POA: Diagnosis not present

## 2021-03-24 DIAGNOSIS — R079 Chest pain, unspecified: Secondary | ICD-10-CM | POA: Diagnosis present

## 2021-03-24 LAB — COMPREHENSIVE METABOLIC PANEL
ALT: 41 U/L (ref 0–44)
AST: 39 U/L (ref 15–41)
Albumin: 3.5 g/dL (ref 3.5–5.0)
Alkaline Phosphatase: 46 U/L (ref 38–126)
Anion gap: 7 (ref 5–15)
BUN: 17 mg/dL (ref 8–23)
CO2: 31 mmol/L (ref 22–32)
Calcium: 8.8 mg/dL — ABNORMAL LOW (ref 8.9–10.3)
Chloride: 104 mmol/L (ref 98–111)
Creatinine, Ser: 1.02 mg/dL (ref 0.61–1.24)
GFR, Estimated: >60 mL/min (ref >60)
Glucose, Bld: 136 mg/dL — ABNORMAL HIGH (ref 70–99)
Potassium: 3.8 mmol/L (ref 3.5–5.1)
Sodium: 142 mmol/L (ref 135–145)
Total Bilirubin: 0.9 mg/dL (ref 0.3–1.2)
Total Protein: 6.9 g/dL (ref 6.5–8.1)

## 2021-03-24 LAB — CBC
HCT: 52.2 % — ABNORMAL HIGH (ref 39.0–52.0)
Hemoglobin: 16.8 g/dL (ref 13.0–17.0)
MCH: 33.5 pg (ref 26.0–34.0)
MCHC: 32.2 g/dL (ref 30.0–36.0)
MCV: 104 fL — ABNORMAL HIGH (ref 80.0–100.0)
Platelets: 164 10*3/uL (ref 150–400)
RBC: 5.02 MIL/uL (ref 4.22–5.81)
RDW: 14.1 % (ref 11.5–15.5)
WBC: 8.3 10*3/uL (ref 4.0–10.5)
nRBC: 0 % (ref 0.0–0.2)

## 2021-03-24 LAB — TSH: TSH: 8.016 u[IU]/mL — ABNORMAL HIGH (ref 0.350–4.500)

## 2021-03-24 LAB — BRAIN NATRIURETIC PEPTIDE: B Natriuretic Peptide: 80.9 pg/mL (ref 0.0–100.0)

## 2021-03-24 LAB — MAGNESIUM: Magnesium: 2.2 mg/dL (ref 1.7–2.4)

## 2021-03-24 LAB — TROPONIN I (HIGH SENSITIVITY)
Troponin I (High Sensitivity): 12 ng/L (ref <18)
Troponin I (High Sensitivity): 12 ng/L (ref <18)

## 2021-03-24 LAB — RESP PANEL BY RT-PCR (FLU A&B, COVID) ARPGX2
Influenza A by PCR: NEGATIVE
Influenza B by PCR: NEGATIVE
SARS Coronavirus 2 by RT PCR: NEGATIVE

## 2021-03-24 MED ORDER — HEPARIN (PORCINE) 25000 UT/250ML-% IV SOLN
1500.0000 [IU]/h | INTRAVENOUS | Status: DC
  Administered 2021-03-25 (×2): 1500 [IU]/h via INTRAVENOUS
  Filled 2021-03-24 (×2): qty 250

## 2021-03-24 MED ORDER — AMIODARONE LOAD VIA INFUSION
150.0000 mg | Freq: Once | INTRAVENOUS | Status: DC
  Filled 2021-03-24: qty 83.34

## 2021-03-24 MED ORDER — FUROSEMIDE 10 MG/ML IJ SOLN
40.0000 mg | Freq: Every day | INTRAMUSCULAR | Status: DC
  Administered 2021-03-25: 40 mg via INTRAVENOUS
  Filled 2021-03-24: qty 4

## 2021-03-24 MED ORDER — DILTIAZEM HCL-DEXTROSE 125-5 MG/125ML-% IV SOLN (PREMIX): 5.0000 mg/h | INTRAVENOUS | Status: DC

## 2021-03-24 MED ORDER — DILTIAZEM LOAD VIA INFUSION
10.0000 mg | Freq: Once | INTRAVENOUS | Status: AC
  Administered 2021-03-24: 10 mg via INTRAVENOUS
  Filled 2021-03-24: qty 10

## 2021-03-24 MED ORDER — HEPARIN BOLUS VIA INFUSION
4000.0000 [IU] | Freq: Once | INTRAVENOUS | Status: AC
  Administered 2021-03-25: 4000 [IU] via INTRAVENOUS
  Filled 2021-03-24: qty 4000

## 2021-03-24 MED ORDER — AMIODARONE HCL IN DEXTROSE 360-4.14 MG/200ML-% IV SOLN: 60.0000 mg/h | INTRAVENOUS | Status: DC

## 2021-03-24 MED ORDER — AMIODARONE HCL IN DEXTROSE 360-4.14 MG/200ML-% IV SOLN: 30.0000 mg/h | INTRAVENOUS | Status: DC

## 2021-03-24 MED ORDER — DILTIAZEM HCL-DEXTROSE 125-5 MG/125ML-% IV SOLN (PREMIX)
5.0000 mg/h | INTRAVENOUS | Status: DC
  Administered 2021-03-24: 5 mg/h via INTRAVENOUS
  Filled 2021-03-24: qty 125

## 2021-03-24 MED ORDER — DILTIAZEM HCL 25 MG/5ML IV SOLN: 10.0000 mg | Freq: Once | INTRAVENOUS | Status: DC

## 2021-03-24 MED ORDER — FUROSEMIDE 10 MG/ML IJ SOLN
60.0000 mg | Freq: Once | INTRAMUSCULAR | Status: AC
  Administered 2021-03-24: 60 mg via INTRAVENOUS
  Filled 2021-03-24: qty 8

## 2021-03-24 NOTE — ED Notes (Signed)
Verbal order from Dr Jeanell Sparrow to give 20mg  bolus.

## 2021-03-24 NOTE — Progress Notes (Signed)
ANTICOAGULATION CONSULT NOTE - Initial Consult  Pharmacy Consult for Heparin Indication: atrial fibrillation  Allergies  Allergen Reactions  . Codeine     Upset stomach   . Sulfa Antibiotics Hives and Rash    Patient Measurements: Height: 6\' 3"  (190.5 cm) Weight: (!) 181.4 kg (400 lb) IBW/kg (Calculated) : 84.5 HEPARIN DW (KG): 128.4 kg   Vital Signs: Temp: 98.2 F (36.8 C) (05/10 1946) Temp Source: Oral (05/10 1946) BP: 138/89 (05/10 2315) Pulse Rate: 110 (05/10 2315)  Labs: Recent Labs    04-05-2021 2023 Apr 05, 2021 2147  HGB 16.8  --   HCT 52.2*  --   PLT 164  --   CREATININE 1.02  --   TROPONINIHS 12 12    Estimated Creatinine Clearance: 109.1 mL/min (by C-G formula based on SCr of 1.02 mg/dL).   Medical History: Past Medical History:  Diagnosis Date  . Allergy   . Back pain   . Cancer ([place])    colon  . Hypertension   . Numbness   . Weakness of right lower extremity     Medications:  Infusions:  . diltiazem (CARDIZEM) infusion 15 mg/hr (05-Apr-2021 2330)    Assessment: 75 yo M with new Afib- unknown duration.  Not on anticoagulation PTA.   CBC WNL. CHADS2Vasc score ~5 Pharmacy consulted to start heparin.   Goal of Therapy:  Heparin level 0.3-0.7 units/ml Monitor platelets by anticoagulation protocol: Yes   Plan:  Heparin 4000 units IV bolus x1 followed by continuous infusion at 1500 units/hr Check 8h heparin level after heparin initiated Daily heparin level & CBC while on heparin Monitor for s/sx of bleeding F/U cardiology recommendations re: anticoagulation  Netta Cedars PharmD Apr 05, 2021,11:37 PM

## 2021-03-24 NOTE — ED Provider Notes (Signed)
Emergency Medicine Provider Triage Evaluation Note  Jose Fitzpatrick , a 75 y.o. male  was evaluated in triage.  Pt complains of chest pain and shortness of breath.  Symptoms have been present since Saturday, for the past 3 days.  He reports chest pain and exertional shortness of breath.  He also reports worsening leg swelling, scrotal swelling, abdominal distention.  No previous history of heart failure.  No history of A. fib.  He is not on anticoagulation.  No fevers, chills, cough, nausea, vomiting.  He is urinating normally.  Review of Systems  Positive: CP, SOB Negative: Fever  Physical Exam  BP (!) 144/111 (BP Location: Left Arm)   Pulse (!) 166   Temp 98.2 F (36.8 C) (Oral)   Resp 19   SpO2 95%  Gen:   Awake Resp:  Normal effort, crackles in bilateral lower lobes MSK:   Moves extremities without difficulty, significant pitting edema bilaterally with chronic skin changes Other:  Tachycardic and irregularly irregular with a heart rate up to 170.  Edema of lower abdominal skin tissue, no tenderness.  Medical Decision Making  Medically screening exam initiated at 8:01 PM.  Appropriate orders placed.  Margrett Rud was informed that the remainder of the evaluation will be completed by another provider, this initial triage assessment does not replace that evaluation, and the importance of remaining in the ED until their evaluation is complete.  Patient what appears to be new onset A. fib with RVR.  Concern for associated new onset heart failure.  Labs, chest x-ray, EKG ordered.  Charge nurse made aware that patient will need to be roomed ASAP.   Franchot Heidelberg, PA-C 04/12/2021 2003    Pattricia Boss, MD 03/25/21 [phone]

## 2021-03-24 NOTE — ED Provider Notes (Signed)
[facility] DEPT Provider Note   CSN: [id number] Arrival date & time: 03/25/2021  1935     History No chief complaint on file.   Jose Fitzpatrick is a 75 y.o. male.  HPI 75 year old male history of hypertension, alcohol abuse, presents today complaining of diffuse swelling and dyspnea.  He states he has been having increased swelling weight gain for the past month.  He has been gradually getting more short of breath.  He is orthopneic and has dyspnea on exertion.  He denies fever, chills, productive cough.  He denies lateralized swelling, history of DVT or PE.  He reports taking his medications as prescribed.  He reports approximately 1 drink a day if he drinks at all.  He is not a smoker.    Past Medical History:  Diagnosis Date  . Allergy   . Back pain   . Cancer ([place])    colon  . Hypertension   . Numbness   . Weakness of right lower extremity     Patient Active Problem List   Diagnosis Date Noted  . Lymphedema 01/13/2021  . Chronic venous insufficiency 01/13/2021  . Peripheral neuropathy 02/18/2016  . Low back pain 02/02/2016  . Abnormality of gait 02/02/2016  . HTN (hypertension) 06/12/2014  . Overweight 06/12/2014    Past Surgical History:  Procedure Laterality Date  . COLON SURGERY         Family History  Problem Relation Age of Onset  . Heart disease Mother   . Hypertension Mother     Social History   Tobacco Use  . Smoking status: Former Research scientist (life sciences)  . Smokeless tobacco: Never Used  . Tobacco comment: Quit 30+ years ago.  Substance Use Topics  . Alcohol use: Yes    Alcohol/week: 0.0 standard drinks    Comment: Rarely  . Drug use: No    Home Medications Prior to Admission medications   Medication Sig Start Date End Date Taking? Authorizing Provider  lisinopril-hydrochlorothiazide (PRINZIDE,ZESTORETIC) 20-25 MG per tablet Take 1 tablet by mouth daily.    [provider]  Multiple Vitamins-Minerals  (MULTIVITAMINS THER. W/MINERALS) TABS Take 1 tablet by mouth daily.    [provider]  zolpidem (AMBIEN) 10 MG tablet Take 10 mg by mouth at bedtime.    [provider]    Allergies    Codeine and Sulfa antibiotics  Review of Systems   Review of Systems  Constitutional: Positive for activity change.  HENT: Negative.   Eyes: Negative.   Respiratory: Positive for shortness of breath.   Cardiovascular: Positive for leg swelling.  Gastrointestinal: Negative.   Endocrine: Negative.   Genitourinary: Negative.   Musculoskeletal: Negative.   Skin: Negative.   Allergic/Immunologic: Negative.   Neurological: Negative.   Hematological: Negative.   Psychiatric/Behavioral: Negative.   All other systems reviewed and are negative.   Physical Exam Updated Vital Signs BP (!) 138/94   Pulse 91   Temp 98.2 F (36.8 C) (Oral)   Resp 19   Ht 1.905 m (6\' 3" )   Wt (!) 181.4 kg   SpO2 97%   BMI 50.00 kg/m   Physical Exam Vitals and nursing note reviewed.  Constitutional:      General: He is not in acute distress.    Appearance: He is obese. He is ill-appearing.  HENT:     Head: Normocephalic.     Right Ear: External ear normal.     Left Ear: External ear normal.  Nose: Nose normal.     Mouth/Throat:     Pharynx: Oropharynx is clear.  Eyes:     Pupils: Pupils are equal, round, and reactive to light.  Cardiovascular:     Rate and Rhythm: Tachycardia present. Rhythm irregular.     Pulses: Normal pulses.  Pulmonary:     Comments: Diffuse rhonchi and expiratory wheezes Abdominal:     General: Bowel sounds are normal.     Palpations: Abdomen is soft.  Musculoskeletal:        General: Swelling present.     Right lower leg: Edema present.     Left lower leg: Edema present.     Comments: Bilateral lower extremity edema with chronic venous stasis changes  Skin:    General: Skin is warm.     Capillary Refill: Capillary refill takes less than 2 seconds.      Comments: Intertriginous rash  Neurological:     General: No focal deficit present.     Mental Status: He is alert.     Cranial Nerves: No cranial nerve deficit.     Motor: No weakness.  Psychiatric:        Mood and Affect: Mood normal.     ED Results / Procedures / Treatments   Labs (all labs ordered are listed, but only abnormal results are displayed) Labs Reviewed  CBC - Abnormal; Notable for the following components:      Result Value   HCT 52.2 (*)    MCV 104.0 (*)    All other components within normal limits  COMPREHENSIVE METABOLIC PANEL - Abnormal; Notable for the following components:   Glucose, Bld 136 (*)    Calcium 8.8 (*)    All other components within normal limits  RESP PANEL BY RT-PCR (FLU A&B, COVID) ARPGX2  MAGNESIUM  BRAIN NATRIURETIC PEPTIDE  TSH  TROPONIN I (HIGH SENSITIVITY)  TROPONIN I (HIGH SENSITIVITY)    EKG EKG Interpretation  Date/Time:  Tuesday Mar 24 2021 19:45:51 EDT Ventricular Rate:  157 PR Interval:    QRS Duration: 90 QT Interval:  325 QTC Calculation: 526 R Axis:   72 Text Interpretation: Atrial fibrillation with rapid V-rate Anterior infarct, old Repolarization abnormality, prob rate related Confirmed by Pattricia Boss [phone]) on 03/31/2021 9:18:58 PM   Radiology DG Chest 2 View  Result Date: 03/21/2021 CLINICAL DATA:  Chest pain history colon cancer EXAM: CHEST - 2 VIEW COMPARISON:  None. FINDINGS: Small bilateral effusions with basilar airspace disease. Mild cardiomegaly with vascular congestion and interstitial pulmonary edema. No pneumothorax IMPRESSION: Mild cardiomegaly with vascular congestion, interstitial pulmonary edema and small bilateral effusions. Electronically Signed   By: Donavan Foil M.D.   On: 03/15/2021 20:18    Procedures .Critical Care Performed by: Pattricia Boss, MD Authorized by: Pattricia Boss, MD   Critical care provider statement:    Critical care time (minutes):  60   Critical care was necessary to  treat or prevent imminent or life-threatening deterioration of the following conditions:  Circulatory failure   Critical care was time spent personally by me on the following activities:  Discussions with consultants, evaluation of patient's response to treatment, examination of patient, ordering and performing treatments and interventions, ordering and review of laboratory studies, ordering and review of radiographic studies, pulse oximetry, re-evaluation of patient's condition, obtaining history from patient or surrogate and review of old charts     Medications Ordered in ED Medications  diltiazem (CARDIZEM) 1 mg/mL load via infusion 10 mg (has no administration  in time range)    And  diltiazem (CARDIZEM) 125 mg in dextrose 5% 125 mL (1 mg/mL) infusion (has no administration in time range)  furosemide (LASIX) injection 60 mg (has no administration in time range)    ED Course  I have reviewed the triage vital signs and the nursing notes.  Pertinent labs & imaging results that were available during my care of the patient were reviewed by me and considered in my medical decision making (see chart for details).    MDM Rules/Calculators/A&P                          1 A. fib RVR-Cardizem started.  Patient is not currently anticoagulated unclear when he was last in a regular rhythm will anticoagulate 2-patient complaining of dyspnea.  His chest x-Corbet Hanley and physical exam findings consistent with CHF.  Lasix has been ordered Cardizem drip in place at this time.  Heart rate is decreased to 150.  Patient received bolus of 5 and rate is at 5.  10 mg bolus and increased rate to 10 ordered 10:17 PM Hr in the 140s-20 mg bolus given and drip increased to 12.5 Patient appears improved.  Oxygen saturations are 96%, blood pressure 146/100 Discussed with Delfina Redwood on-call for cardiology and she feels patient is stable to stay here and cardiology will see in consult tomorrow Discussed with Dr. Flossie Buffy,  on-call for hospitalist and she will see for admission She is aware that patient has not yet anticoagulated and has elevated TSH. Final Clinical Impression(s) / ED Diagnoses Final diagnoses:  Atrial fibrillation with RVR ([place])  Acute pulmonary edema [facility])    Rx / DC Orders ED Discharge Orders    None       Pattricia Boss, MD 04/13/2021 2250

## 2021-03-24 NOTE — ED Triage Notes (Addendum)
Patient states that when he walks a short distance that he cant breathe. His lower and upper extremities are swollen. Patient states started a month ago and has steadily increased.

## 2021-03-24 NOTE — H&P (Incomplete)
History and Physical    Jose Fitzpatrick ERX:[id number] DOB: 06-10-1946 DOA: 04/01/2021  PCP: Tamsen Roers, MD  Patient coming from: Home   I have personally briefly reviewed patient's old medical records in [place]  Chief Complaint: ***  HPI: Jose Fitzpatrick is a 75 y.o. male with medical history significant for superobesity, hypertension, chronic venous insufficiency and peripheral neuropathy who presents with concerns of increasing shortness of breath and worsening lower extremity edema.  For the past 2 days, he has noticed increasing shortness of breath especially with ambulation.  Also having difficulty laying flat and has been sleeping on an incline.  He has chronic venous insufficiency with chronic lower extremity edema but feels like his legs have become more edematous up to his abdomen.  There is also been weeping of fluids on the legs.  He denies any chest pain, palpitations or tightness.  No previous cardiac history.  Denies any heavy alcohol use or illicit drug use.  ED Course: He initially presented with atrial fibrillation with RVR with rates of up to 180.  Chest x-ray shows mild cardiomegaly with vascular congestion, interstitial pulmonary edema and small bilateral effusions . He was given a total of 20 mg diltiazem bolus and started on diltiazem infusion.  CBC and BMP unremarkable.  BNP of 88 troponin of 12.  He was also given 60 of IV Lasix with over 1 L of output.  ED Dr. Jeanell Sparrow discussed with cardiology who agrees with diltiazem infusion and suspect heart failure is secondary to the atrial fibrillation with RVR.  They will see in consultation in the morning.  Hospitalist called for admission.  Review of Systems: Constitutional: No Weight Change, No Fever ENT/Mouth: No sore throat, No Rhinorrhea Eyes: No Eye Pain, No Vision Changes Cardiovascular: No Chest Pain, + SOB, No PND, + Dyspnea on Exertion, No Orthopnea, No Claudication, + Edema, No  Palpitations Respiratory: No Cough, No Sputum, No Wheezing, no Dyspnea  Gastrointestinal: No Nausea, No Vomiting, No Diarrhea, No Constipation, No Pain Genitourinary: no Urinary Incontinence, No Urgency, No Flank Pain Musculoskeletal: No Arthralgias, No Myalgias Skin: No Skin Lesions, No Pruritus, Neuro: no Weakness, No Numbness, Psych: No Anxiety/Panic, No Depression, no decrease appetite Heme/Lymph: No Bruising, No Bleeding  Past Medical History:  Diagnosis Date  . Allergy   . Back pain   . Cancer ([place])    colon  . Hypertension   . Numbness   . Weakness of right lower extremity     Past Surgical History:  Procedure Laterality Date  . COLON SURGERY       reports that he has quit smoking. He has never used smokeless tobacco. He reports current alcohol use. He reports that he does not use drugs. Social History  Allergies  Allergen Reactions  . Codeine     Upset stomach   . Sulfa Antibiotics Hives and Rash    Family History  Problem Relation Age of Onset  . Heart disease Mother   . Hypertension Mother      Prior to Admission medications   Medication Sig Start Date End Date Taking? Authorizing Provider  albuterol (VENTOLIN HFA) 108 (90 Base) MCG/ACT inhaler Inhale 2 puffs into the lungs every 6 (six) hours as needed for wheezing or shortness of breath. 01/29/21  Yes [provider]  gabapentin (NEURONTIN) 300 MG capsule Take 2 capsules by mouth daily in the afternoon. 03/22/21  Yes [provider]  metoprolol succinate (TOPROL-XL) 50 MG 24 hr tablet Take 50  mg by mouth daily. 03/11/21  Yes [provider]  Multiple Vitamins-Minerals (MULTIVITAMINS THER. W/MINERALS) TABS Take 1 tablet by mouth daily.   Yes [provider]  temazepam (RESTORIL) 15 MG capsule Take 15 mg by mouth at bedtime. 03/07/21  Yes [provider]  testosterone cypionate (DEPOTESTOSTERONE CYPIONATE) 200 MG/ML injection Inject 200 mg into the muscle every 14  (fourteen) days. 03/16/21   [provider]    Physical Exam: Vitals:   03/19/2021 2230 03/16/2021 2245 04/06/2021 2300 03/20/2021 2315  BP: (!) 163/120 (!) 130/95 (!) 144/104 138/89  Pulse: (!) 136  (!) 136 (!) 110  Resp: 19 16 17 15   Temp:      TempSrc:      SpO2: 99% 97% 97% 97%  Weight:      Height:        Constitutional: NAD, calm, comfortable Vitals:   04/01/2021 2230 04/07/2021 2245 03/22/2021 2300 03/15/2021 2315  BP: (!) 163/120 (!) 130/95 (!) 144/104 138/89  Pulse: (!) 136  (!) 136 (!) 110  Resp: 19 16 17 15   Temp:      TempSrc:      SpO2: 99% 97% 97% 97%  Weight:      Height:       Eyes: PERRL, lids and conjunctivae normal ENMT: Mucous membranes are moist. Posterior pharynx clear of any exudate or lesions.Normal dentition.  Neck: normal, supple, no masses, no thyromegaly Respiratory: clear to auscultation bilaterally, no wheezing, no crackles. Normal respiratory effort. No accessory muscle use.  Cardiovascular: Regular rate and rhythm, no murmurs / rubs / gallops. No extremity edema. 2+ pedal pulses. No carotid bruits.  Abdomen: no tenderness, no masses palpated. No hepatosplenomegaly. Bowel sounds positive.  Musculoskeletal: no clubbing / cyanosis. No joint deformity upper and lower extremities. Good ROM, no contractures. Normal muscle tone.  Skin: no rashes, lesions, ulcers. No induration Neurologic: CN 2-12 grossly intact. Sensation intact, DTR normal. Strength 5/5 in all 4.  Psychiatric: Normal judgment and insight. Alert and oriented x 3. Normal mood.   (Anything < 9 systems with 2 bullets each down codes to level 1) (If patient refuses exam can't bill higher level) (Make sure to document decubitus ulcers present on admission -- if possible -- and whether patient has chronic indwelling catheter at time of admission)  Labs on Admission: I have personally reviewed following labs and imaging studies  CBC: Recent Labs  Lab 04/06/2021 2023  WBC 8.3  HGB 16.8  HCT  52.2*  MCV 104.0*  PLT 998   Basic Metabolic Panel: Recent Labs  Lab 03/23/2021 2023  NA 142  K 3.8  CL 104  CO2 31  GLUCOSE 136*  BUN 17  CREATININE 1.02  CALCIUM 8.8*  MG 2.2   GFR: Estimated Creatinine Clearance: 109.1 mL/min (by C-G formula based on SCr of 1.02 mg/dL). Liver Function Tests: Recent Labs  Lab 04/09/2021 2023  AST 39  ALT 41  ALKPHOS 46  BILITOT 0.9  PROT 6.9  ALBUMIN 3.5   No results for input(s): LIPASE, AMYLASE in the last 168 hours. No results for input(s): AMMONIA in the last 168 hours. Coagulation Profile: No results for input(s): INR, PROTIME in the last 168 hours. Cardiac Enzymes: No results for input(s): CKTOTAL, CKMB, CKMBINDEX, TROPONINI in the last 168 hours. BNP (last 3 results) No results for input(s): PROBNP in the last 8760 hours. HbA1C: No results for input(s): HGBA1C in the last 72 hours. CBG: No results for input(s): GLUCAP in the last  168 hours. Lipid Profile: No results for input(s): CHOL, HDL, LDLCALC, TRIG, CHOLHDL, LDLDIRECT in the last 72 hours. Thyroid Function Tests: Recent Labs    03/25/2021 2023  TSH 8.016*   Anemia Panel: No results for input(s): VITAMINB12, FOLATE, FERRITIN, TIBC, IRON, RETICCTPCT in the last 72 hours. Urine analysis:    Component Value Date/Time   COLORURINE YELLOW 07/20/2015 2041   APPEARANCEUR CLEAR 07/20/2015 2041   LABSPEC 1.015 07/20/2015 2041   PHURINE 5.0 07/20/2015 2041   GLUCOSEU NEGATIVE 07/20/2015 2041   HGBUR NEGATIVE 07/20/2015 2041   [place] NEGATIVE 07/20/2015 2041   KETONESUR NEGATIVE 07/20/2015 2041   PROTEINUR NEGATIVE 07/20/2015 2041   UROBILINOGEN 0.2 07/20/2015 2041   NITRITE NEGATIVE 07/20/2015 2041   LEUKOCYTESUR NEGATIVE 07/20/2015 2041    Radiological Exams on Admission: DG Chest 2 View  Result Date: 04/12/2021 CLINICAL DATA:  Chest pain history colon cancer EXAM: CHEST - 2 VIEW COMPARISON:  None. FINDINGS: Small bilateral effusions with basilar airspace  disease. Mild cardiomegaly with vascular congestion and interstitial pulmonary edema. No pneumothorax IMPRESSION: Mild cardiomegaly with vascular congestion, interstitial pulmonary edema and small bilateral effusions. Electronically Signed   By: Donavan Foil M.D.   On: 04/09/2021 20:18      Assessment/Plan  Level of care: Progressive  Status is: Inpatient  {Inpatient:23812}  Dispo: The patient is from: {From:23814}              Anticipated d/c is to: {To:23815}              Patient currently {Medically stable:23817}   Difficult to place patient {Yes/No:25151}         Orene Desanctis DO [facility]   If 7PM-7AM, please contact night-coverage [email or web address]   04/11/2021, 11:38 PM

## 2021-03-24 NOTE — H&P (Signed)
History and Physical    Jose Fitzpatrick CWC:[id number] DOB: [birth year] DOA: 03/29/2021  PCP: Tamsen Roers, MD  Patient coming from: Home   I have personally briefly reviewed patient's old medical records in [place]  Chief Complaint: increasing shortness of breath   HPI: Jose Fitzpatrick is a 75 y.o. male with medical history significant for superobesity, hypertension, chronic venous insufficiency and peripheral neuropathy who presents with concerns of increasing shortness of breath and worsening lower extremity edema.  For the past 2 days, he has noticed increasing shortness of breath especially with ambulation.  Also having difficulty laying flat and has been sleeping on an incline.  He has chronic venous insufficiency with chronic lower extremity edema but feels like his legs have become more edematous up to his abdomen.  There is also been weeping of fluids on the legs.  He denies any chest pain, palpitations or tightness.  No previous cardiac history.  Denies any heavy alcohol use or illicit drug use.  ED Course: He initially presented with atrial fibrillation with RVR with rates of up to 180.  Chest x-ray shows mild cardiomegaly with vascular congestion, interstitial pulmonary edema and small bilateral effusions . He was given a total of 20 mg diltiazem bolus and started on diltiazem infusion.  CBC and BMP unremarkable.  BNP of 88 troponin of 12.  He was also given 60 of IV Lasix with over 1 L of output.  ED Dr. Jeanell Sparrow discussed with cardiology who agrees with diltiazem infusion and suspect heart failure is secondary to the atrial fibrillation with RVR.  They will see in consultation in the morning.  Hospitalist called for admission.  Review of Systems: Constitutional: No Weight Change, No Fever ENT/Mouth: No sore throat, No Rhinorrhea Eyes: No Eye Pain, No Vision Changes Cardiovascular: No Chest Pain, + SOB, No PND, + Dyspnea on Exertion, No Orthopnea, No Claudication, +  Edema, No Palpitations Respiratory: No Cough, No Sputum, No Wheezing, no Dyspnea  Gastrointestinal: No Nausea, No Vomiting, No Diarrhea, No Constipation, No Pain Genitourinary: no Urinary Incontinence, No Urgency, No Flank Pain Musculoskeletal: No Arthralgias, No Myalgias Skin: No Skin Lesions, No Pruritus, Neuro: no Weakness, No Numbness, Psych: No Anxiety/Panic, No Depression, no decrease appetite Heme/Lymph: No Bruising, No Bleeding  Past Medical History:  Diagnosis Date  . Allergy   . Back pain   . Cancer ([place])    colon  . Hypertension   . Numbness   . Weakness of right lower extremity     Past Surgical History:  Procedure Laterality Date  . COLON SURGERY       reports that he has quit smoking. He has never used smokeless tobacco. He reports current alcohol use. He reports that he does not use drugs. Social History  Allergies  Allergen Reactions  . Codeine     Upset stomach   . Sulfa Antibiotics Hives and Rash    Family History  Problem Relation Age of Onset  . Heart disease Mother   . Hypertension Mother      Prior to Admission medications   Medication Sig Start Date End Date Taking? Authorizing Provider  albuterol (VENTOLIN HFA) 108 (90 Base) MCG/ACT inhaler Inhale 2 puffs into the lungs every 6 (six) hours as needed for wheezing or shortness of breath. 01/29/21  Yes [provider]  gabapentin (NEURONTIN) 300 MG capsule Take 2 capsules by mouth daily in the afternoon. 03/22/21  Yes [provider]  metoprolol succinate (TOPROL-XL) 50 MG 24  hr tablet Take 50 mg by mouth daily. 03/11/21  Yes [provider]  Multiple Vitamins-Minerals (MULTIVITAMINS THER. W/MINERALS) TABS Take 1 tablet by mouth daily.   Yes [provider]  temazepam (RESTORIL) 15 MG capsule Take 15 mg by mouth at bedtime. 03/07/21  Yes [provider]  testosterone cypionate (DEPOTESTOSTERONE CYPIONATE) 200 MG/ML injection Inject 200 mg into the muscle  every 14 (fourteen) days. 03/16/21   [provider]    Physical Exam: Vitals:   03/15/2021 2230 03/19/2021 2245 04/14/2021 2300 04/03/2021 2315  BP: (!) 163/120 (!) 130/95 (!) 144/104 138/89  Pulse: (!) 136  (!) 136 (!) 110  Resp: 19 16 17 15   Temp:      TempSrc:      SpO2: 99% 97% 97% 97%  Weight:      Height:        Constitutional: NAD, calm, comfortable, elderly super morbidly obese male laying at 30 degree incline in bed Vitals:   03/26/2021 2230 03/28/2021 2245 04/09/2021 2300 04/12/2021 2315  BP: (!) 163/120 (!) 130/95 (!) 144/104 138/89  Pulse: (!) 136  (!) 136 (!) 110  Resp: 19 16 17 15   Temp:      TempSrc:      SpO2: 99% 97% 97% 97%  Weight:      Height:       Eyes: PERRL, lids and conjunctivae normal ENMT: Mucous membranes are moist.  Neck: normal, supple Respiratory: clear to auscultation bilaterally, no wheezing, no crackles. Normal respiratory effort on 2L. No accessory muscle use.  Cardiovascular: Regular rate and rhythm, no murmurs / rubs / gallops.  +3 pitting bilateral lower extremity edema with weeping of fluid.   Abdomen: no tenderness, no masses palpated.  Bowel sounds positive.  Musculoskeletal: no clubbing / cyanosis. No joint deformity upper and lower extremities. Good ROM, no contractures. Normal muscle tone.  Skin: Chronic venous stasis changes of the skin with weeping and edema of bilateral lower extremity      Neurologic: CN 2-12 grossly intact. Sensation intact, DTR normal. Strength 5/5 in all 4.  Psychiatric: Normal judgment and insight. Alert and oriented x 3. Normal mood.     Labs on Admission: I have personally reviewed following labs and imaging studies  CBC: Recent Labs  Lab 03/21/2021 2023  WBC 8.3  HGB 16.8  HCT 52.2*  MCV 104.0*  PLT 527   Basic Metabolic Panel: Recent Labs  Lab 04/11/2021 2023  NA 142  K 3.8  CL 104  CO2 31  GLUCOSE 136*  BUN 17  CREATININE 1.02  CALCIUM 8.8*  MG 2.2   GFR: Estimated Creatinine  Clearance: 109.1 mL/min (by C-G formula based on SCr of 1.02 mg/dL). Liver Function Tests: Recent Labs  Lab 04/13/2021 2023  AST 39  ALT 41  ALKPHOS 46  BILITOT 0.9  PROT 6.9  ALBUMIN 3.5   No results for input(s): LIPASE, AMYLASE in the last 168 hours. No results for input(s): AMMONIA in the last 168 hours. Coagulation Profile: No results for input(s): INR, PROTIME in the last 168 hours. Cardiac Enzymes: No results for input(s): CKTOTAL, CKMB, CKMBINDEX, TROPONINI in the last 168 hours. BNP (last 3 results) No results for input(s): PROBNP in the last 8760 hours. HbA1C: No results for input(s): HGBA1C in the last 72 hours. CBG: No results for input(s): GLUCAP in the last 168 hours. Lipid Profile: No results for input(s): CHOL, HDL, LDLCALC, TRIG, CHOLHDL, LDLDIRECT in the last 72 hours. Thyroid Function Tests: Recent  Labs    03/16/2021 2023  TSH 8.016*   Anemia Panel: No results for input(s): VITAMINB12, FOLATE, FERRITIN, TIBC, IRON, RETICCTPCT in the last 72 hours. Urine analysis:    Component Value Date/Time   COLORURINE YELLOW 07/20/2015 2041   APPEARANCEUR CLEAR 07/20/2015 2041   LABSPEC 1.015 07/20/2015 2041   PHURINE 5.0 07/20/2015 2041   GLUCOSEU NEGATIVE 07/20/2015 2041   HGBUR NEGATIVE 07/20/2015 2041   [place] NEGATIVE 07/20/2015 2041   KETONESUR NEGATIVE 07/20/2015 2041   PROTEINUR NEGATIVE 07/20/2015 2041   UROBILINOGEN 0.2 07/20/2015 2041   NITRITE NEGATIVE 07/20/2015 2041   LEUKOCYTESUR NEGATIVE 07/20/2015 2041    Radiological Exams on Admission: DG Chest 2 View  Result Date: 03/16/2021 CLINICAL DATA:  Chest pain history colon cancer EXAM: CHEST - 2 VIEW COMPARISON:  None. FINDINGS: Small bilateral effusions with basilar airspace disease. Mild cardiomegaly with vascular congestion and interstitial pulmonary edema. No pneumothorax IMPRESSION: Mild cardiomegaly with vascular congestion, interstitial pulmonary edema and small bilateral effusions.  Electronically Signed   By: Donavan Foil M.D.   On: 04/07/2021 20:18      Assessment/Plan Atrial fibrillation with RVR -Possibly secondary to probable hypothyroidism with TSH of 8 - Initially started on diltiazem infusion but rates continue to be uncontrolled up to 180s on max dose - Discussed with on-call cardiology [place] regarding lack of rate control and she recommended 25 mg metoprolol q6hr rather than amiodarone infusion - If rate still uncontrol, they will just evaluate in the morning for likely cardioversion with TEE -CHA2DVAS2C score of 3 (age, HTN)- start on heparin infusion   New onset CHF - likely due to new A.fib - IV lasix 40mg  daily -Strict intake and output - Daily weight -echo ordered for now while awaiting further cardiology recs in the morning and pending his rate control  Acute hypoxic respiratory failure - Likely secondary to atrial fibrillation and CHF with pulmonary edema -Maintain O2 greater than 90% -Wean as tolerated while on IV diuretics  Elevated TSH - Possibly Hypothyroidism - will repeat TSH with free T4 in the morning - hold on levothyroxine until verified since it takes several weeks to take effect so one dose will not change much   HTN -hold antihypertensives while receiving metoprolol and Lasix   Super morbid obesity BMI of 50 -Complicates clinical course  DVT prophylaxis:.Lovenox Code Status: Full Family Communication: Plan discussed with patient at bedside  disposition Plan: Home with at least 2 midnight stays  Consults called: Cardiology-will see in the morning Admission status: inpatient   Level of care: Progressive  Status is: Inpatient  Remains inpatient appropriate because:Inpatient level of care appropriate due to severity of illness   Dispo: The patient is from: Home              Anticipated d/c is to: Home              Patient currently is not medically stable to d/c.   Difficult to place patient No          Orene Desanctis DO [facility]   If 7PM-7AM, please contact night-coverage [email or web address]   04/11/2021, 11:38 PM

## 2021-03-24 NOTE — ED Notes (Signed)
Patient put on 2 L [place] for comfort. Patient was having a problem getting oxygen.

## 2021-03-25 ENCOUNTER — Other Ambulatory Visit (HOSPITAL_COMMUNITY): Payer: Medicare HMO

## 2021-03-25 DIAGNOSIS — I4891 Unspecified atrial fibrillation: Secondary | ICD-10-CM | POA: Diagnosis not present

## 2021-03-25 DIAGNOSIS — J9601 Acute respiratory failure with hypoxia: Secondary | ICD-10-CM

## 2021-03-25 DIAGNOSIS — I509 Heart failure, unspecified: Secondary | ICD-10-CM | POA: Diagnosis not present

## 2021-03-25 DIAGNOSIS — Z6841 Body Mass Index (BMI) 40.0 and over, adult: Secondary | ICD-10-CM

## 2021-03-25 DIAGNOSIS — R7989 Other specified abnormal findings of blood chemistry: Secondary | ICD-10-CM

## 2021-03-25 LAB — TSH: TSH: 4.87 u[IU]/mL — ABNORMAL HIGH (ref 0.350–4.500)

## 2021-03-25 LAB — BASIC METABOLIC PANEL
Anion gap: 9 (ref 5–15)
BUN: 17 mg/dL (ref 8–23)
CO2: 34 mmol/L — ABNORMAL HIGH (ref 22–32)
Calcium: 8.6 mg/dL — ABNORMAL LOW (ref 8.9–10.3)
Chloride: 100 mmol/L (ref 98–111)
Creatinine, Ser: 1.06 mg/dL (ref 0.61–1.24)
GFR, Estimated: >60 mL/min (ref >60)
Glucose, Bld: 120 mg/dL — ABNORMAL HIGH (ref 70–99)
Potassium: 3.4 mmol/L — ABNORMAL LOW (ref 3.5–5.1)
Sodium: 143 mmol/L (ref 135–145)

## 2021-03-25 LAB — CBC
HCT: 50.2 % (ref 39.0–52.0)
Hemoglobin: 16.2 g/dL (ref 13.0–17.0)
MCH: 33.5 pg (ref 26.0–34.0)
MCHC: 32.3 g/dL (ref 30.0–36.0)
MCV: 103.7 fL — ABNORMAL HIGH (ref 80.0–100.0)
Platelets: 160 10*3/uL (ref 150–400)
RBC: 4.84 MIL/uL (ref 4.22–5.81)
RDW: 14.1 % (ref 11.5–15.5)
WBC: 10.1 10*3/uL (ref 4.0–10.5)
nRBC: 0 % (ref 0.0–0.2)

## 2021-03-25 LAB — HEPARIN LEVEL (UNFRACTIONATED)
Heparin Unfractionated: 0.38 IU/mL (ref 0.30–0.70)
Heparin Unfractionated: 0.28 IU/mL — ABNORMAL LOW (ref 0.30–0.70)

## 2021-03-25 LAB — T4, FREE: Free T4: 0.88 ng/dL (ref 0.61–1.12)

## 2021-03-25 LAB — MRSA PCR SCREENING: MRSA by PCR: NEGATIVE

## 2021-03-25 MED ORDER — IPRATROPIUM BROMIDE 0.02 % IN SOLN: 0.5000 mg | Freq: Three times a day (TID) | RESPIRATORY_TRACT | Status: DC

## 2021-03-25 MED ORDER — IPRATROPIUM BROMIDE 0.02 % IN SOLN
0.5000 mg | Freq: Four times a day (QID) | RESPIRATORY_TRACT | Status: DC
  Administered 2021-03-25 – 2021-03-26 (×3): 0.5 mg via RESPIRATORY_TRACT
  Filled 2021-03-25 (×3): qty 2.5

## 2021-03-25 MED ORDER — GABAPENTIN 300 MG PO CAPS
600.0000 mg | ORAL_CAPSULE | Freq: Every evening | ORAL | Status: DC
  Administered 2021-03-25 – 2021-04-16 (×24): 600 mg via ORAL
  Filled 2021-03-25 (×24): qty 2

## 2021-03-25 MED ORDER — DILTIAZEM HCL-DEXTROSE 125-5 MG/125ML-% IV SOLN (PREMIX)
5.0000 mg/h | INTRAVENOUS | Status: DC
  Administered 2021-03-25: 5 mg/h via INTRAVENOUS
  Administered 2021-03-26 – 2021-03-30 (×12): 15 mg/h via INTRAVENOUS
  Filled 2021-03-25 (×15): qty 125

## 2021-03-25 MED ORDER — THIAMINE HCL 100 MG PO TABS
100.0000 mg | ORAL_TABLET | Freq: Every day | ORAL | Status: DC
  Administered 2021-03-25 – 2021-04-16 (×23): 100 mg via ORAL
  Filled 2021-03-25 (×23): qty 1

## 2021-03-25 MED ORDER — FUROSEMIDE 10 MG/ML IJ SOLN
80.0000 mg | Freq: Two times a day (BID) | INTRAMUSCULAR | Status: DC
  Administered 2021-03-25 – 2021-03-30 (×10): 80 mg via INTRAVENOUS
  Filled 2021-03-25 (×11): qty 8

## 2021-03-25 MED ORDER — LEVALBUTEROL HCL 1.25 MG/0.5ML IN NEBU
1.2500 mg | INHALATION_SOLUTION | Freq: Four times a day (QID) | RESPIRATORY_TRACT | Status: DC
  Administered 2021-03-25 – 2021-03-26 (×3): 1.25 mg via RESPIRATORY_TRACT
  Filled 2021-03-25 (×3): qty 0.5

## 2021-03-25 MED ORDER — SPIRONOLACTONE 25 MG PO TABS
25.0000 mg | ORAL_TABLET | Freq: Every day | ORAL | Status: DC
  Administered 2021-03-25 – 2021-03-30 (×6): 25 mg via ORAL
  Filled 2021-03-25 (×6): qty 1

## 2021-03-25 MED ORDER — HEPARIN (PORCINE) 25000 UT/250ML-% IV SOLN
1750.0000 [IU]/h | INTRAVENOUS | Status: DC
  Administered 2021-03-26 – 2021-03-28 (×3): 1750 [IU]/h via INTRAVENOUS
  Filled 2021-03-25 (×4): qty 250

## 2021-03-25 MED ORDER — ALBUTEROL SULFATE HFA 108 (90 BASE) MCG/ACT IN AERS: 2.0000 | INHALATION_SPRAY | Freq: Four times a day (QID) | RESPIRATORY_TRACT | Status: DC | PRN

## 2021-03-25 MED ORDER — LEVALBUTEROL HCL 1.25 MG/0.5ML IN NEBU: 1.2500 mg | INHALATION_SOLUTION | Freq: Three times a day (TID) | RESPIRATORY_TRACT | Status: DC

## 2021-03-25 MED ORDER — LORAZEPAM 1 MG PO TABS
1.0000 mg | ORAL_TABLET | ORAL | Status: AC | PRN
  Administered 2021-03-27: 2 mg via ORAL
  Filled 2021-03-25: qty 2

## 2021-03-25 MED ORDER — LORAZEPAM 2 MG/ML IJ SOLN: 1.0000 mg | INTRAMUSCULAR | Status: AC | PRN

## 2021-03-25 MED ORDER — DILTIAZEM HCL 25 MG/5ML IV SOLN
10.0000 mg | Freq: Once | INTRAVENOUS | Status: AC
  Administered 2021-03-25: 10 mg via INTRAVENOUS
  Filled 2021-03-25: qty 5

## 2021-03-25 MED ORDER — FUROSEMIDE 10 MG/ML IJ SOLN
40.0000 mg | Freq: Once | INTRAMUSCULAR | Status: AC
  Administered 2021-03-25: 40 mg via INTRAVENOUS
  Filled 2021-03-25: qty 4

## 2021-03-25 MED ORDER — ORAL CARE MOUTH RINSE
15.0000 mL | Freq: Two times a day (BID) | OROMUCOSAL | Status: DC
  Administered 2021-03-25 – 2021-04-17 (×34): 15 mL via OROMUCOSAL

## 2021-03-25 MED ORDER — THIAMINE HCL 100 MG/ML IJ SOLN
100.0000 mg | Freq: Every day | INTRAMUSCULAR | Status: DC
  Filled 2021-03-25: qty 2

## 2021-03-25 MED ORDER — METOPROLOL TARTRATE 25 MG PO TABS
25.0000 mg | ORAL_TABLET | Freq: Four times a day (QID) | ORAL | Status: DC
  Administered 2021-03-25 – 2021-03-27 (×10): 25 mg via ORAL
  Filled 2021-03-25 (×10): qty 1

## 2021-03-25 MED ORDER — FOLIC ACID 1 MG PO TABS
1.0000 mg | ORAL_TABLET | Freq: Every day | ORAL | Status: DC
  Administered 2021-03-25 – 2021-04-16 (×23): 1 mg via ORAL
  Filled 2021-03-25 (×23): qty 1

## 2021-03-25 MED ORDER — TEMAZEPAM 15 MG PO CAPS
15.0000 mg | ORAL_CAPSULE | Freq: Every day | ORAL | Status: DC
  Administered 2021-03-25 – 2021-03-31 (×8): 15 mg via ORAL
  Filled 2021-03-25 (×8): qty 1

## 2021-03-25 MED ORDER — FUROSEMIDE 10 MG/ML IJ SOLN: 40.0000 mg | Freq: Two times a day (BID) | INTRAMUSCULAR | Status: DC

## 2021-03-25 MED ORDER — CHLORHEXIDINE GLUCONATE CLOTH 2 % EX PADS
6.0000 | MEDICATED_PAD | Freq: Every day | CUTANEOUS | Status: DC
  Administered 2021-03-25 – 2021-04-05 (×12): 6 via TOPICAL

## 2021-03-25 MED ORDER — ADULT MULTIVITAMIN W/MINERALS CH
1.0000 | ORAL_TABLET | Freq: Every day | ORAL | Status: DC
  Administered 2021-03-25 – 2021-04-16 (×23): 1 via ORAL
  Filled 2021-03-25 (×23): qty 1

## 2021-03-25 NOTE — ED Notes (Signed)
Patients incontinent device was replaced. Patient stated he felt hot. A fan was provided. Patient states he feels uncomfortable in the stretcher. I advise I would see about getting him hospital bed.

## 2021-03-25 NOTE — ED Notes (Signed)
Patient IV became occluded in the RAC. Will add another IV consult.

## 2021-03-25 NOTE — Progress Notes (Signed)
PROGRESS NOTE    Jose Fitzpatrick  ZJI:[id number] DOB: March 05, 1946 DOA: 04/07/2021 PCP: Tamsen Roers, MD    No chief complaint on file.   Brief Narrative:  History of hypertension, peripheral neuropathy came  in sob/DOE/increased swelling/weight gain Found to have pulmonary edema/small bilateral effusions, A. fib/a flutter RVR  Subjective:  Cardizem drip discontinued overnight due to improvement in heart rate, however  Heart rate 178 this morning, he denies chest pain, no dizziness, he state does not feel palpitation Systolic blood pressure 175Z  Assessment & Plan:   Principal Problem:   Atrial fibrillation with RVR (HCC) Active Problems:   HTN (hypertension)   Acute CHF (congestive heart failure) (HCC)   Acute respiratory failure with hypoxia (HCC)   Elevated TSH   BMI 50.0-59.9, adult (HCC)  Afib/RVR tsh 4.8 Keep k above 4, mag above 2  echo pending Continue Heparin drip Restart cardizem drip Cards consulted, input appreciated   Acute on chronic heart failure exacerbation, troponin negative Presented with short of breath, dyspnea on exertion, increased swelling and weight gain Found to have pulmonary edema and bilateral pleural effusion There is no documented hypoxia He does has wheezing on exam, significant volume overloaded on exam No prior echocardiogram, echo pending at this time Increase IV Lasix, strict ins and out, daily weight Follow cardiology recommendation  HTN Currently on Cardizem drip and Lopressor  Body mass index is 50 kg/m. Need outpatient sleep study  FTT: lives with family, progressive weakness, sit most of the day, will need PT eval once medically stable     Unresulted Labs (From admission, onward)          Start     Ordered   03/26/21 0258  Basic metabolic panel  Daily,   R      03/25/21 0838   03/26/21 0500  Magnesium  Tomorrow morning,   R        03/25/21 0838   03/26/21 0500  Heparin level (unfractionated)  Daily,   R       03/25/21 1025   03/25/21 1600  Heparin level (unfractionated)  Once-Timed,   TIMED        03/25/21 0927   03/25/21 0500  CBC  Daily,   R      03/19/2021 2348            DVT prophylaxis: Currently on heparin drip   Code Status: Full Family Communication: Patient Disposition:   Status is: Inpatient  Dispo: The patient is from: Home              Anticipated d/c is to: Home, likely will need home health              Anticipated d/c date is: Greater than 3 days                Consultants:   Cardiology  Procedures:   None  Antimicrobials:   Anti-infectives (From admission, onward)   None          Objective: Vitals:   03/25/21 0900 03/25/21 1000 03/25/21 1044 03/25/21 1100  BP: (!) 162/77 (!) 153/82  (!) 147/78  Pulse: 90 88 (!) 160 88  Resp: 19 19  18   Temp:    (!) 97.1 F (36.2 C)  TempSrc:    Axillary  SpO2: 97% 97%  98%  Weight:      Height:        Intake/Output Summary (Last 24 hours) at 03/25/2021 1430 Last data filed  at 03/25/2021 1100 Gross per 24 hour  Intake 269 ml  Output 1200 ml  Net -931 ml   Filed Weights   2021-04-18 2037  Weight: (!) 181.4 kg    Examination:  General exam: calm, NAD Respiratory system: mild bilateral wheezing, diminished at bases Cardiovascular system: S1 & S2 heard, IRRR.  Gastrointestinal system: Abdomen is nondistended, soft and nontender.  Normal bowel sounds heard. Central nervous system: Alert and oriented. No focal neurological deficits. Extremities: Generalized edema Skin: chronic venous stasis changes, bilateral lower extremity 3+/4+ edema Psychiatry: Judgement and insight appear normal. Mood & affect appropriate.     Data Reviewed: I have personally reviewed following labs and imaging studies  CBC: Recent Labs  Lab 04-18-21 2023 03/25/21 0558  WBC 8.3 10.1  HGB 16.8 16.2  HCT 52.2* 50.2  MCV 104.0* 103.7*  PLT 164 270    Basic Metabolic Panel: Recent Labs  Lab 18-Apr-2021 2023  03/25/21 0558  NA 142 143  K 3.8 3.4*  CL 104 100  CO2 31 34*  GLUCOSE 136* 120*  BUN 17 17  CREATININE 1.02 1.06  CALCIUM 8.8* 8.6*  MG 2.2  --     GFR: Estimated Creatinine Clearance: 105 mL/min (by C-G formula based on SCr of 1.06 mg/dL).  Liver Function Tests: Recent Labs  Lab 04/18/21 2023  AST 39  ALT 41  ALKPHOS 46  BILITOT 0.9  PROT 6.9  ALBUMIN 3.5    CBG: No results for input(s): GLUCAP in the last 168 hours.   Recent Results (from the past 240 hour(s))  Resp Panel by RT-PCR (Flu A&B, Covid) Nasopharyngeal Swab     Status: None   Collection Time: 04-18-2021  9:24 PM   Specimen: Nasopharyngeal Swab; Nasopharyngeal(NP) swabs in vial transport medium  Result Value Ref Range Status   SARS Coronavirus 2 by RT PCR NEGATIVE NEGATIVE Final    Comment: (NOTE) SARS-CoV-2 target nucleic acids are NOT DETECTED.  The SARS-CoV-2 RNA is generally detectable in upper respiratory specimens during the acute phase of infection. The lowest concentration of SARS-CoV-2 viral copies this assay can detect is 138 copies/mL. A negative result does not preclude SARS-Cov-2 infection and should not be used as the sole basis for treatment or other patient management decisions. A negative result may occur with  improper specimen collection/handling, submission of specimen other than nasopharyngeal swab, presence of viral mutation(s) within the areas targeted by this assay, and inadequate number of viral copies(<138 copies/mL). A negative result must be combined with clinical observations, patient history, and epidemiological information. The expected result is Negative.  Fact Sheet for Patients:  EntrepreneurPulse.com.au  Fact Sheet for Healthcare Providers:  IncredibleEmployment.be  This test is no t yet approved or cleared by the [place] FDA and  has been authorized for detection and/or diagnosis of SARS-CoV-2 by FDA under an Emergency  Use Authorization (EUA). This EUA will remain  in effect (meaning this test can be used) for the duration of the COVID-19 declaration under Section 564(b)(1) of the Act, 21 U.S.C.section 360bbb-3(b)(1), unless the authorization is terminated  or revoked sooner.       Influenza A by PCR NEGATIVE NEGATIVE Final   Influenza B by PCR NEGATIVE NEGATIVE Final    Comment: (NOTE) The Xpert Xpress SARS-CoV-2/FLU/RSV plus assay is intended as an aid in the diagnosis of influenza from Nasopharyngeal swab specimens and should not be used as a sole basis for treatment. Nasal washings and aspirates are unacceptable for Xpert Xpress SARS-CoV-2/FLU/RSV testing.  Fact Sheet for Patients: EntrepreneurPulse.com.au  Fact Sheet for Healthcare Providers: IncredibleEmployment.be  This test is not yet approved or cleared by the [place] FDA and has been authorized for detection and/or diagnosis of SARS-CoV-2 by FDA under an Emergency Use Authorization (EUA). This EUA will remain in effect (meaning this test can be used) for the duration of the COVID-19 declaration under Section 564(b)(1) of the Act, 21 U.S.C. section 360bbb-3(b)(1), unless the authorization is terminated or revoked.  Performed at [facility], [place] [address] [place], [place] [address]   MRSA PCR Screening     Status: None   Collection Time: 03/25/21  8:45 AM   Specimen: Nasal Mucosa; Nasopharyngeal  Result Value Ref Range Status   MRSA by PCR NEGATIVE NEGATIVE Final    Comment:        The GeneXpert MRSA Assay (FDA approved for NASAL specimens only), is one component of a comprehensive MRSA colonization surveillance program. It is not intended to diagnose MRSA infection nor to guide or monitor treatment for MRSA infections. Performed at [facility], [place] [address] [place], [place] [address]          Radiology Studies: DG Chest 2  View  Result Date: 04/07/2021 CLINICAL DATA:  Chest pain history colon cancer EXAM: CHEST - 2 VIEW COMPARISON:  None. FINDINGS: Small bilateral effusions with basilar airspace disease. Mild cardiomegaly with vascular congestion and interstitial pulmonary edema. No pneumothorax IMPRESSION: Mild cardiomegaly with vascular congestion, interstitial pulmonary edema and small bilateral effusions. Electronically Signed   By: Donavan Foil M.D.   On: 03/29/2021 20:18        Scheduled Meds: . Chlorhexidine Gluconate Cloth  6 each Topical Daily  . folic acid  1 mg Oral Daily  . furosemide  80 mg Intravenous BID  . gabapentin  600 mg Oral QPM  . mouth rinse  15 mL Mouth Rinse BID  . metoprolol tartrate  25 mg Oral Q6H  . multivitamin with minerals  1 tablet Oral Daily  . spironolactone  25 mg Oral Daily  . temazepam  15 mg Oral QHS  . thiamine  100 mg Oral Daily   Or  . thiamine  100 mg Intravenous Daily   Continuous Infusions: . diltiazem (CARDIZEM) infusion 10 mg/hr (03/25/21 1100)  . heparin 1,500 Units/hr (03/25/21 1427)     LOS: 1 day   Time spent: 35 mins Greater than 50% of this time was spent in counseling, explanation of diagnosis, planning of further management, and coordination of care.   Voice Recognition Viviann Spare dictation system was used to create this note, attempts have been made to correct errors. Please contact the author with questions and/or clarifications.   Florencia Reasons, MD PhD FACP [facility]  Available via Epic secure chat 7am-7pm for nonurgent issues Please page for urgent issues To page the attending provider between 7A-7P or the covering provider during after hours 7P-7A, please log into the web site [email or web address] and access using universal [place] password for that web site. If you do not have the password, please call the hospital operator.    03/25/2021, 2:30 PM

## 2021-03-25 NOTE — Progress Notes (Signed)
ANTICOAGULATION CONSULT NOTE - Brief note  Pharmacy Consult for Heparin Indication: atrial fibrillation   Medications:  Infusions:  . diltiazem (CARDIZEM) infusion 10 mg/hr (03/25/21 1100)  . heparin 1,500 Units/hr (03/25/21 1100)    Assessment: 75 yo M with new Afib- unknown duration.  Pharmacy consulted to start heparin.  Heparin level decreased to 0.28, sub-therapeutic.  No off times or interruptions noted by RN. No reported s/s bleeding or complications.    Goal of Therapy:  Heparin level 0.3-0.7 units/ml Monitor platelets by anticoagulation protocol: Yes   Plan:  Increase to IV heparin 1750 units/hr Heparin level in 8 hours Daily heparin level & CBC while on heparin Monitor for s/sx of bleeding F/U cardiology recommendations re: anticoagulation  Gretta Arab PharmD, BCPS Clinical Pharmacist [facility] main pharmacy [phone] 03/25/2021 1:51 PM

## 2021-03-25 NOTE — ED Notes (Signed)
IV team at bedside 

## 2021-03-25 NOTE — ED Notes (Signed)
Verbal order from Hospitalist Chotiner to give 27m Diltiazem IV for HR of 180
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
## 2021-03-25 NOTE — Progress Notes (Incomplete)
2D echo attempted but HR was too high. Will try again when HR is normal.   Dustin Flock, RCS

## 2021-03-25 NOTE — Progress Notes (Signed)
ANTICOAGULATION CONSULT NOTE - Follow Up Consult  Pharmacy Consult for Heparin Indication: atrial fibrillation  Allergies  Allergen Reactions  . Codeine     Upset stomach   . Sulfa Antibiotics Hives and Rash    Patient Measurements: Height: 6\' 3"  (190.5 cm) Weight: (!) 181.4 kg (400 lb) IBW/kg (Calculated) : 84.5 HEPARIN DW (KG): 128.4 kg   Vital Signs: Temp: 97.5 F (36.4 C) (05/11 0848) Temp Source: Axillary (05/11 0848) BP: 134/95 (05/11 0745) Pulse Rate: 89 (05/11 0856)  Labs: Recent Labs    03/29/2021 2023 04/01/2021 2147 03/25/21 0558 03/25/21 0858  HGB 16.8  --  16.2  --   HCT 52.2*  --  50.2  --   PLT 164  --  160  --   HEPARINUNFRC  --   --   --  0.38  CREATININE 1.02  --  1.06  --   TROPONINIHS 12 12  --   --     Estimated Creatinine Clearance: 105 mL/min (by C-G formula based on SCr of 1.06 mg/dL).   Medical History: Past Medical History:  Diagnosis Date  . Allergy   . Back pain   . Cancer ([place])    colon  . Hypertension   . Numbness   . Weakness of right lower extremity     Medications:  Infusions:  . heparin 1,500 Units/hr (03/25/21 0012)    Assessment: 75 yo M with new Afib- unknown duration.  Not on anticoagulation PTA.   CBC WNL. CHADS2Vasc score ~5 Pharmacy consulted to start heparin.   Goal of Therapy:  Heparin level 0.3-0.7 units/ml Monitor platelets by anticoagulation protocol: Yes   Plan:  Continue heparin infusion at 1500 units/hr Re-check 8h heparin level to verify therapeutic Daily heparin level & CBC while on heparin Monitor for s/sx of bleeding F/U cardiology recommendations re: anticoagulation  Peggyann Juba, PharmD, BCPS Pharmacy: [phone] 03/25/2021,9:21 AM

## 2021-03-26 ENCOUNTER — Inpatient Hospital Stay (HOSPITAL_COMMUNITY): Payer: Medicare HMO

## 2021-03-26 DIAGNOSIS — I509 Heart failure, unspecified: Secondary | ICD-10-CM | POA: Diagnosis not present

## 2021-03-26 DIAGNOSIS — I4891 Unspecified atrial fibrillation: Secondary | ICD-10-CM | POA: Diagnosis not present

## 2021-03-26 DIAGNOSIS — I4892 Unspecified atrial flutter: Secondary | ICD-10-CM | POA: Diagnosis not present

## 2021-03-26 LAB — BASIC METABOLIC PANEL
Anion gap: 8 (ref 5–15)
BUN: 16 mg/dL (ref 8–23)
CO2: 36 mmol/L — ABNORMAL HIGH (ref 22–32)
Calcium: 8.4 mg/dL — ABNORMAL LOW (ref 8.9–10.3)
Chloride: 99 mmol/L (ref 98–111)
Creatinine, Ser: 1.08 mg/dL (ref 0.61–1.24)
GFR, Estimated: >60 mL/min (ref >60)
Glucose, Bld: 113 mg/dL — ABNORMAL HIGH (ref 70–99)
Potassium: 3.2 mmol/L — ABNORMAL LOW (ref 3.5–5.1)
Sodium: 143 mmol/L (ref 135–145)

## 2021-03-26 LAB — MAGNESIUM: Magnesium: 1.9 mg/dL (ref 1.7–2.4)

## 2021-03-26 LAB — HEPARIN LEVEL (UNFRACTIONATED)
Heparin Unfractionated: 0.67 IU/mL (ref 0.30–0.70)
Heparin Unfractionated: 0.6 IU/mL (ref 0.30–0.70)

## 2021-03-26 LAB — CBC
HCT: 47.6 % (ref 39.0–52.0)
Hemoglobin: 15.2 g/dL (ref 13.0–17.0)
MCH: 33.4 pg (ref 26.0–34.0)
MCHC: 31.9 g/dL (ref 30.0–36.0)
MCV: 104.6 fL — ABNORMAL HIGH (ref 80.0–100.0)
Platelets: 144 10*3/uL — ABNORMAL LOW (ref 150–400)
RBC: 4.55 MIL/uL (ref 4.22–5.81)
RDW: 14 % (ref 11.5–15.5)
WBC: 9.1 10*3/uL (ref 4.0–10.5)
nRBC: 0 % (ref 0.0–0.2)

## 2021-03-26 MED ORDER — METOPROLOL TARTRATE 5 MG/5ML IV SOLN
2.5000 mg | INTRAVENOUS | Status: DC | PRN
  Administered 2021-03-26 – 2021-03-27 (×2): 2.5 mg via INTRAVENOUS
  Filled 2021-03-26 (×2): qty 5

## 2021-03-26 MED ORDER — POTASSIUM CHLORIDE CRYS ER 20 MEQ PO TBCR
40.0000 meq | EXTENDED_RELEASE_TABLET | Freq: Two times a day (BID) | ORAL | Status: DC
  Administered 2021-03-27 – 2021-03-30 (×8): 40 meq via ORAL
  Filled 2021-03-26 (×8): qty 2

## 2021-03-26 MED ORDER — IPRATROPIUM BROMIDE 0.02 % IN SOLN
0.5000 mg | Freq: Three times a day (TID) | RESPIRATORY_TRACT | Status: DC
  Administered 2021-03-26 – 2021-03-30 (×11): 0.5 mg via RESPIRATORY_TRACT
  Filled 2021-03-26 (×14): qty 2.5

## 2021-03-26 MED ORDER — POTASSIUM CHLORIDE CRYS ER 20 MEQ PO TBCR
40.0000 meq | EXTENDED_RELEASE_TABLET | Freq: Once | ORAL | Status: AC
  Administered 2021-03-26: 40 meq via ORAL
  Filled 2021-03-26: qty 2

## 2021-03-26 MED ORDER — LEVALBUTEROL HCL 1.25 MG/0.5ML IN NEBU
1.2500 mg | INHALATION_SOLUTION | Freq: Three times a day (TID) | RESPIRATORY_TRACT | Status: DC
  Administered 2021-03-26 – 2021-03-30 (×11): 1.25 mg via RESPIRATORY_TRACT
  Filled 2021-03-26 (×14): qty 0.5

## 2021-03-26 MED ORDER — MAGNESIUM OXIDE -MG SUPPLEMENT 400 (240 MG) MG PO TABS
400.0000 mg | ORAL_TABLET | Freq: Every day | ORAL | Status: DC
  Administered 2021-03-26: 400 mg via ORAL
  Filled 2021-03-26: qty 1

## 2021-03-26 NOTE — Progress Notes (Signed)
[place] for Heparin Indication: atrial fibrillation  Allergies  Allergen Reactions  . Codeine     Upset stomach   . Sulfa Antibiotics Hives and Rash    Patient Measurements: Height: 6\' 3"  (190.5 cm) Weight: (!) 215.9 kg (476 lb) IBW/kg (Calculated) : 84.5 HEPARIN DW (KG): 128.4 kg   Vital Signs: Temp: 98.2 F (36.8 C) (05/12 1130) Temp Source: Oral (05/12 1130) BP: 139/88 (05/12 1100) Pulse Rate: 136 (05/12 1100)  Labs: Recent Labs    04/07/2021 2023 03/27/2021 2147 03/25/21 0558 03/25/21 0858 03/25/21 1651 03/26/21 0251 03/26/21 1131  HGB 16.8  --  16.2  --   --  15.2  --   HCT 52.2*  --  50.2  --   --  47.6  --   PLT 164  --  160  --   --  144*  --   HEPARINUNFRC  --   --   --    < > 0.28* 0.67 0.60  CREATININE 1.02  --  1.06  --   --  1.08  --   TROPONINIHS 12 12  --   --   --   --   --    < > = values in this interval not displayed.    Estimated Creatinine Clearance: 114.6 mL/min (by C-G formula based on SCr of 1.08 mg/dL).   Medical History: Past Medical History:  Diagnosis Date  . Allergy   . Back pain   . Cancer ([place])    colon  . Hypertension   . Numbness   . Weakness of right lower extremity     Medications:  Infusions:  . diltiazem (CARDIZEM) infusion 15 mg/hr (03/26/21 0944)  . heparin 1,750 Units/hr (03/26/21 0545)    Assessment: 75 yo M with new Afib- unknown duration.  Not on anticoagulation PTA.   Morbid obesity CBC WNL. CHADS2Vasc score 3. Pharmacy consulted to start heparin.   03/26/2021:  0300 Heparin level 0.67- therapeutic on IV heparin 1750 units/hr  CBC: Hgb WNL, pltc 144-sl decr from admission & now just below goal threshold of 150  No bleeding or infusion related issues reported by RN  1131 confirmatory Heparin level 0.60 on 1750 units/hr, no problems noted by RN  Goal of Therapy:  Heparin level 0.3-0.7 units/ml Monitor platelets by anticoagulation protocol: Yes   Plan:   Continue heparin infusion at 1750 units/hr Daily heparin level & CBC while on heparin Monitor for s/sx of bleeding Cards: continue to diurese, plan oral anti-coagulation when more stable, recommend Cardioversion   Minda Ditto PharmD Pharmacy: [phone] 03/26/2021,12:34 PM

## 2021-03-26 NOTE — Progress Notes (Signed)
Pt's lungs have air movement. The Pt's wheeze is mainly cominfrom his neck. RT will continue to monitor

## 2021-03-26 NOTE — Progress Notes (Signed)
ANTICOAGULATION CONSULT NOTE - Follow Up Consult  Pharmacy Consult for Heparin Indication: atrial fibrillation  Allergies  Allergen Reactions  . Codeine     Upset stomach   . Sulfa Antibiotics Hives and Rash    Patient Measurements: Height: 6\' 3"  (190.5 cm) Weight: (!) 181.4 kg (400 lb) IBW/kg (Calculated) : 84.5 HEPARIN DW (KG): 128.4 kg   Vital Signs: Temp: 97.3 F (36.3 C) (05/11 1900) Temp Source: Oral (05/11 1900) BP: 136/80 (05/12 0000) Pulse Rate: 135 (05/12 0000)  Labs: Recent Labs    03/29/2021 2023 03/18/2021 2147 03/25/21 0558 03/25/21 0858 03/25/21 1651 03/26/21 0251  HGB 16.8  --  16.2  --   --  15.2  HCT 52.2*  --  50.2  --   --  47.6  PLT 164  --  160  --   --  144*  HEPARINUNFRC  --   --   --  0.38 0.28* 0.67  CREATININE 1.02  --  1.06  --   --  1.08  TROPONINIHS 12 12  --   --   --   --     Estimated Creatinine Clearance: 103.1 mL/min (by C-G formula based on SCr of 1.08 mg/dL).   Medical History: Past Medical History:  Diagnosis Date  . Allergy   . Back pain   . Cancer ([place])    colon  . Hypertension   . Numbness   . Weakness of right lower extremity     Medications:  Infusions:  . diltiazem (CARDIZEM) infusion 15 mg/hr (03/26/21 0133)  . heparin 1,750 Units/hr (03/25/21 2000)    Assessment: 75 yo M with new Afib- unknown duration.  Not on anticoagulation PTA.   CBC WNL. CHADS2Vasc score 3. Pharmacy consulted to start heparin.   03/26/2021:  Heparin level 0.67- therapeutic on IV heparin 1750 units/hr  CBC: Hg WNL, pltc 144-slightly decreased from admission & now just below goal threshold of 150  No bleeding or infusion related issues reported by RN  Goal of Therapy:  Heparin level 0.3-0.7 units/ml Monitor platelets by anticoagulation protocol: Yes   Plan:  Continue heparin infusion at 1750 units/hr Re-check 8h heparin level to ensure doesn't trend to supra-therapeutic range Daily heparin level & CBC while on  heparin Monitor for s/sx of bleeding F/U cardiology recommendations re: anticoagulation  Netta Cedars, PharmD, BCPS Pharmacy: [phone] 03/26/2021,3:40 AM

## 2021-03-26 NOTE — Progress Notes (Signed)
Per d/w Dr. Stanford Breed, tentatively put patient on schedule for TEE/DCCV on Monday @ 11:30 with Dr. Johney Frame - finalized plans/orders can be made in rounds.

## 2021-03-26 NOTE — Progress Notes (Signed)
  Echocardiogram 2D Echocardiogram has been attempted. Patient HR 133-135. Patient HR too elevated to complete echo.  Randa Lynn Sevag Shearn 03/26/2021, 1:36 PM

## 2021-03-26 NOTE — Progress Notes (Signed)
Pt refused to wear or even attempt CPAP states "he's to claustrophobic " to wear. Nurse informed.
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
## 2021-03-26 NOTE — Progress Notes (Signed)
RN paged TRH about patients HR 130-140 with no complaints of pain. Dr. [place] Crane advised to give 0600 dose of metoprolol early at 0430. Also Dr. [place] Crane ordered IV 2.5mg  metoprolol PRN.

## 2021-03-26 NOTE — TOC Initial Note (Signed)
Transition of Care [facility]) - Initial/Assessment Note    Patient Details  Name: Jose Fitzpatrick MRN: [id number] Date of Birth: 15-May-1946  [facility] CM/SW Contact:    Leeroy Cha, RN Phone Number: 03/26/2021, 8:41 AM  Clinical Narrative:                 75 y.o. male with medical history significant for superobesity, hypertension, chronic venous insufficiency and peripheral neuropathy who presents with concerns of increasing shortness of breath and worsening lower extremity edema.  For the past 2 days, he has noticed increasing shortness of breath especially with ambulation.  Also having difficulty laying flat and has been sleeping on an incline.  He has chronic venous insufficiency with chronic lower extremity edema but feels like his legs have become more edematous up to his abdomen.  There is also been weeping of fluids on the legs.  He denies any chest pain, palpitations or tightness.  No previous cardiac history.  Denies any heavy alcohol use or illicit drug use.  ED Course: He initially presented with atrial fibrillation with RVR with rates of up to 180.  Chest x-ray shows mild cardiomegaly with vascular congestion, interstitial pulmonary edema and small bilateral effusions . He was given a total of 20 mg diltiazem bolus and started on diltiazem infusion.  CBC and BMP unremarkable.  BNP of 88 troponin of 12.  He was also given 60 of IV Lasix with over 1 L of output. PLAN: to return to home once condition is stabilized. Expected Discharge Plan: Home/Self Care Barriers to Discharge: Continued Medical Work up   Patient Goals and CMS Choice Patient states their goals for this hospitalization and ongoing recovery are:: to go home CMS Medicare.gov Compare Post Acute Care list provided to:: Patient Choice offered to / list presented to : Patient  Expected Discharge Plan and Services Expected Discharge Plan: Home/Self Care   Discharge Planning Services: CM Consult    Living arrangements for the past 2 months: Single Family Home                                      Prior Living Arrangements/Services Living arrangements for the past 2 months: Single Family Home Lives with:: Spouse Patient language and need for interpreter reviewed:: Yes                 Activities of Daily Living Home Assistive Devices/Equipment: TEFL teacher (specify quad or straight) ADL Screening (condition at time of admission) Patient's cognitive ability adequate to safely complete daily activities?: Yes Is the patient deaf or have difficulty hearing?: No Does the patient have difficulty seeing, even when wearing glasses/contacts?: No Does the patient have difficulty concentrating, remembering, or making decisions?: No Patient able to express need for assistance with ADLs?: Yes Does the patient have difficulty dressing or bathing?: No Independently performs ADLs?: Yes (appropriate for developmental age) Does the patient have difficulty walking or climbing stairs?: Yes (secondary to shortness of breath and lower extremity swelling) Weakness of Legs: Right Weakness of Arms/Hands: None  Permission Sought/Granted                  Emotional Assessment Appearance:: Appears stated age Attitude/Demeanor/Rapport: Engaged Affect (typically observed): Calm Orientation: : Oriented to Place,Oriented to Self,Oriented to  Time,Oriented to Situation Alcohol / Substance Use: Not Applicable Psych Involvement: No (comment)  Admission diagnosis:  Acute pulmonary edema ([place]) [  J81.0] Atrial fibrillation with RVR ([place]) [I48.91] Patient Active Problem List   Diagnosis Date Noted  . Acute CHF (congestive heart failure) ([place]) 03/25/2021  . Acute respiratory failure with hypoxia ([place]) 03/25/2021  . Elevated TSH 03/25/2021  . BMI 50.0-59.9, adult ([place]) 03/25/2021  . Atrial fibrillation with RVR ([place]) 03/25/2021  . Lymphedema 01/13/2021  . Chronic venous insufficiency  01/13/2021  . Peripheral neuropathy 02/18/2016  . Low back pain 02/02/2016  . Abnormality of gait 02/02/2016  . HTN (hypertension) 06/12/2014  . Overweight 06/12/2014   PCP:  Tamsen Roers, MD Pharmacy:   [id number] - [place], [address] [place] [address] Phone: [phone] Fax: [phone]     Social Determinants of Health (SDOH) Interventions    Readmission Risk Interventions No flowsheet data found.

## 2021-03-26 NOTE — Progress Notes (Signed)
PROGRESS NOTE    Jose Fitzpatrick  KYH:[id number] DOB: [birth year] DOA: 04/11/2021 PCP: Tamsen Roers, MD    No chief complaint on file.   Brief Narrative:  History of hypertension, peripheral neuropathy came  in sob/DOE/increased swelling/weight gain Found to have pulmonary edema/small bilateral effusions, A. fib/a flutter RVR  Subjective:   He is tolerating Cardizem drip, heparin drip Remains to have  tachycardia, heart rate around 151V, systolic blood pressure around 110 Reports feeling tired, denies pain currently On 2liter oxygen supplement   Assessment & Plan:   Principal Problem:   Atrial fibrillation with RVR (HCC) Active Problems:   HTN (hypertension)   Acute CHF (congestive heart failure) (HCC)   Acute respiratory failure with hypoxia (HCC)   Elevated TSH   BMI 50.0-59.9, adult (HCC)  Afib/RVR tsh 4.8 Keep k above 4, mag above 2  echo pending, was not able to be done due to tachycardia Continue Heparin drip,  cardizem drip Continue scheduled Lopressor Cards consulted, input appreciated, plan for cardioversion on Monday  Hypokalemia/hypomagnesemia Replace, monitor   Acute on chronic heart failure exacerbation, troponin negative Presented with progressive short of breath, dyspnea on exertion, increased swelling and weight gain Found to have pulmonary edema and bilateral pleural effusion There is no documented hypoxia He does has wheezing on exam, significant volume overloaded on exam No prior echocardiogram, echo pending at this time On IV Lasix 80 mg twice daily, strict ins and out, daily weight Follow cardiology recommendation  HTN Currently on Cardizem drip and Lopressor  Microcytosis MCV 104 Possibly from alcohol Will check B12/folate  Body mass index is 59.5 kg/m. Need outpatient sleep study  patient agreed a trial of  CPAP at night here in the hospital, order placed  Report daily alcohol use On CIWA protocol  FTT: lives with family,  progressive weakness, sit most of the day, will need PT eval once medically stable     Unresulted Labs (From admission, onward)          Start     Ordered   03/27/21 0500  Heparin level (unfractionated)  Daily,   R      03/25/21 1825   03/27/21 0500  Magnesium  Daily,   R     Question:  Specimen collection method  Answer:  Lab=Lab collect  "And" Linked Group Details   03/26/21 0928   03/26/21 6160  Basic metabolic panel  Daily,   R      03/25/21 0838   03/25/21 0500  CBC  Daily,   R      04/11/2021 2348            DVT prophylaxis: Currently on heparin drip   Code Status: Full Family Communication: Patient Disposition:   Status is: Inpatient  Dispo: The patient is from: Home              Anticipated d/c is to: Home, likely will need home health              Anticipated d/c date is: Greater than 3 days, plan for cardioversion on Monday                Consultants:   Cardiology  Procedures:   None  Antimicrobials:   Anti-infectives (From admission, onward)   None          Objective: Vitals:   03/26/21 0900 03/26/21 1000 03/26/21 1100 03/26/21 1130  BP:   139/88   Pulse: (!) 134 (!) 134 (!) 136  Resp: (!) 25 14 17    Temp:    98.2 F (36.8 C)  TempSrc:    Oral  SpO2: 95% 93% 96%   Weight:      Height:        Intake/Output Summary (Last 24 hours) at 03/26/2021 1503 Last data filed at 03/26/2021 1402 Gross per 24 hour  Intake 975.92 ml  Output 4450 ml  Net -3474.08 ml   Filed Weights   04/09/21 2037 03/26/21 0500  Weight: (!) 181.4 kg (!) 215.9 kg    Examination:  General exam: calm, NAD, obese  Respiratory system: mild bilateral wheezing, diminished at bases Cardiovascular system: S1 & S2 heard, IRRR.  Gastrointestinal system: Abdomen is nondistended, soft and nontender.  Normal bowel sounds heard. Central nervous system: Alert and oriented. No focal neurological deficits. Extremities: Generalized edema Skin: chronic venous stasis  changes, bilateral lower extremity 3+/4+ edema Psychiatry: Judgement and insight appear normal. Mood & affect appropriate.     Data Reviewed: I have personally reviewed following labs and imaging studies  CBC: Recent Labs  Lab 04/09/21 2023 03/25/21 0558 03/26/21 0251  WBC 8.3 10.1 9.1  HGB 16.8 16.2 15.2  HCT 52.2* 50.2 47.6  MCV 104.0* 103.7* 104.6*  PLT 164 160 144*    Basic Metabolic Panel: Recent Labs  Lab Apr 09, 2021 2023 03/25/21 0558 03/26/21 0251  NA 142 143 143  K 3.8 3.4* 3.2*  CL 104 100 99  CO2 31 34* 36*  GLUCOSE 136* 120* 113*  BUN 17 17 16   CREATININE 1.02 1.06 1.08  CALCIUM 8.8* 8.6* 8.4*  MG 2.2  --  1.9    GFR: Estimated Creatinine Clearance: 114.6 mL/min (by C-G formula based on SCr of 1.08 mg/dL).  Liver Function Tests: Recent Labs  Lab 04/09/2021 2023  AST 39  ALT 41  ALKPHOS 46  BILITOT 0.9  PROT 6.9  ALBUMIN 3.5    CBG: No results for input(s): GLUCAP in the last 168 hours.   Recent Results (from the past 240 hour(s))  Resp Panel by RT-PCR (Flu A&B, Covid) Nasopharyngeal Swab     Status: None   Collection Time: 2021/04/09  9:24 PM   Specimen: Nasopharyngeal Swab; Nasopharyngeal(NP) swabs in vial transport medium  Result Value Ref Range Status   SARS Coronavirus 2 by RT PCR NEGATIVE NEGATIVE Final    Comment: (NOTE) SARS-CoV-2 target nucleic acids are NOT DETECTED.  The SARS-CoV-2 RNA is generally detectable in upper respiratory specimens during the acute phase of infection. The lowest concentration of SARS-CoV-2 viral copies this assay can detect is 138 copies/mL. A negative result does not preclude SARS-Cov-2 infection and should not be used as the sole basis for treatment or other patient management decisions. A negative result may occur with  improper specimen collection/handling, submission of specimen other than nasopharyngeal swab, presence of viral mutation(s) within the areas targeted by this assay, and inadequate  number of viral copies(<138 copies/mL). A negative result must be combined with clinical observations, patient history, and epidemiological information. The expected result is Negative.  Fact Sheet for Patients:  EntrepreneurPulse.com.au  Fact Sheet for Healthcare Providers:  IncredibleEmployment.be  This test is no t yet approved or cleared by the [place] FDA and  has been authorized for detection and/or diagnosis of SARS-CoV-2 by FDA under an Emergency Use Authorization (EUA). This EUA will remain  in effect (meaning this test can be used) for the duration of the COVID-19 declaration under Section 564(b)(1) of the Act, 21 U.S.C.section 360bbb-3(b)(1),  unless the authorization is terminated  or revoked sooner.       Influenza A by PCR NEGATIVE NEGATIVE Final   Influenza B by PCR NEGATIVE NEGATIVE Final    Comment: (NOTE) The Xpert Xpress SARS-CoV-2/FLU/RSV plus assay is intended as an aid in the diagnosis of influenza from Nasopharyngeal swab specimens and should not be used as a sole basis for treatment. Nasal washings and aspirates are unacceptable for Xpert Xpress SARS-CoV-2/FLU/RSV testing.  Fact Sheet for Patients: BloggerCourse.com  Fact Sheet for Healthcare Providers: SeriousBroker.it  This test is not yet approved or cleared by the [place] FDA and has been authorized for detection and/or diagnosis of SARS-CoV-2 by FDA under an Emergency Use Authorization (EUA). This EUA will remain in effect (meaning this test can be used) for the duration of the COVID-19 declaration under Section 564(b)(1) of the Act, 21 U.S.C. section 360bbb-3(b)(1), unless the authorization is terminated or revoked.  Performed at [facility], [address] [place], [place] [address]   MRSA PCR Screening     Status: None   Collection Time: 03/25/21  8:45 AM   Specimen: Nasal  Mucosa; Nasopharyngeal  Result Value Ref Range Status   MRSA by PCR NEGATIVE NEGATIVE Final    Comment:        The GeneXpert MRSA Assay (FDA approved for NASAL specimens only), is one component of a comprehensive MRSA colonization surveillance program. It is not intended to diagnose MRSA infection nor to guide or monitor treatment for MRSA infections. Performed at [facility], [address], [place], [place] [address]          Radiology Studies: DG Chest 2 View  Result Date: 04/04/2021 CLINICAL DATA:  Chest pain history colon cancer EXAM: CHEST - 2 VIEW COMPARISON:  None. FINDINGS: Small bilateral effusions with basilar airspace disease. Mild cardiomegaly with vascular congestion and interstitial pulmonary edema. No pneumothorax IMPRESSION: Mild cardiomegaly with vascular congestion, interstitial pulmonary edema and small bilateral effusions. Electronically Signed   By: Jasmine Pang M.D.   On: 04/08/2021 20:18        Scheduled Meds: . Chlorhexidine Gluconate Cloth  6 each Topical Daily  . folic acid  1 mg Oral Daily  . furosemide  80 mg Intravenous BID  . gabapentin  600 mg Oral QPM  . ipratropium  0.5 mg Nebulization TID  . levalbuterol  1.25 mg Nebulization TID  . magnesium oxide  400 mg Oral Daily  . mouth rinse  15 mL Mouth Rinse BID  . metoprolol tartrate  25 mg Oral Q6H  . multivitamin with minerals  1 tablet Oral Daily  . [START ON 03/27/2021] potassium chloride  40 mEq Oral BID  . spironolactone  25 mg Oral Daily  . temazepam  15 mg Oral QHS  . thiamine  100 mg Oral Daily   Or  . thiamine  100 mg Intravenous Daily   Continuous Infusions: . diltiazem (CARDIZEM) infusion 15 mg/hr (03/26/21 1402)  . heparin 1,750 Units/hr (03/26/21 1402)     LOS: 2 days   Time spent: 25 mins Greater than 50% of this time was spent in counseling, explanation of diagnosis, planning of further management, and coordination of care.   Voice Recognition  Reubin Milan dictation system was used to create this note, attempts have been made to correct errors. Please contact the author with questions and/or clarifications.   Albertine Grates, MD PhD FACP [facility]  Available via Epic secure chat 7am-7pm for nonurgent issues  Please page for urgent issues To page the attending provider between 7A-7P or the covering provider during after hours 7P-7A, please log into the web site [email or web address] and access using universal [place] password for that web site. If you do not have the password, please call the hospital operator.    03/26/2021, 3:03 PM

## 2021-03-27 ENCOUNTER — Inpatient Hospital Stay (HOSPITAL_COMMUNITY): Payer: Medicare HMO

## 2021-03-27 ENCOUNTER — Other Ambulatory Visit (HOSPITAL_COMMUNITY): Payer: Medicare HMO

## 2021-03-27 DIAGNOSIS — I4891 Unspecified atrial fibrillation: Secondary | ICD-10-CM | POA: Diagnosis not present

## 2021-03-27 DIAGNOSIS — I4892 Unspecified atrial flutter: Secondary | ICD-10-CM

## 2021-03-27 LAB — CBC
HCT: 48.6 % (ref 39.0–52.0)
Hemoglobin: 15.3 g/dL (ref 13.0–17.0)
MCH: 33 pg (ref 26.0–34.0)
MCHC: 31.5 g/dL (ref 30.0–36.0)
MCV: 105 fL — ABNORMAL HIGH (ref 80.0–100.0)
Platelets: 144 10*3/uL — ABNORMAL LOW (ref 150–400)
RBC: 4.63 MIL/uL (ref 4.22–5.81)
RDW: 13.7 % (ref 11.5–15.5)
WBC: 9.6 10*3/uL (ref 4.0–10.5)
nRBC: 0 % (ref 0.0–0.2)

## 2021-03-27 LAB — COMPREHENSIVE METABOLIC PANEL
ALT: 36 U/L (ref 0–44)
AST: 52 U/L — ABNORMAL HIGH (ref 15–41)
Albumin: 3.3 g/dL — ABNORMAL LOW (ref 3.5–5.0)
Alkaline Phosphatase: 50 U/L (ref 38–126)
Anion gap: 8 (ref 5–15)
BUN: 19 mg/dL (ref 8–23)
CO2: 36 mmol/L — ABNORMAL HIGH (ref 22–32)
Calcium: 8.2 mg/dL — ABNORMAL LOW (ref 8.9–10.3)
Chloride: 94 mmol/L — ABNORMAL LOW (ref 98–111)
Creatinine, Ser: 0.95 mg/dL (ref 0.61–1.24)
GFR, Estimated: >60 mL/min (ref >60)
Glucose, Bld: 130 mg/dL — ABNORMAL HIGH (ref 70–99)
Potassium: 4 mmol/L (ref 3.5–5.1)
Sodium: 138 mmol/L (ref 135–145)
Total Bilirubin: 0.9 mg/dL (ref 0.3–1.2)
Total Protein: 6.7 g/dL (ref 6.5–8.1)

## 2021-03-27 LAB — BASIC METABOLIC PANEL
Anion gap: 7 (ref 5–15)
BUN: 16 mg/dL (ref 8–23)
CO2: 37 mmol/L — ABNORMAL HIGH (ref 22–32)
Calcium: 8.1 mg/dL — ABNORMAL LOW (ref 8.9–10.3)
Chloride: 96 mmol/L — ABNORMAL LOW (ref 98–111)
Creatinine, Ser: 1.05 mg/dL (ref 0.61–1.24)
GFR, Estimated: >60 mL/min (ref >60)
Glucose, Bld: 118 mg/dL — ABNORMAL HIGH (ref 70–99)
Potassium: 3.7 mmol/L (ref 3.5–5.1)
Sodium: 140 mmol/L (ref 135–145)

## 2021-03-27 LAB — VITAMIN B12: Vitamin B-12: 387 pg/mL (ref 180–914)

## 2021-03-27 LAB — HEPARIN LEVEL (UNFRACTIONATED): Heparin Unfractionated: 0.59 IU/mL (ref 0.30–0.70)

## 2021-03-27 LAB — ECHOCARDIOGRAM LIMITED
Height: 75 in
Weight: 6444.49 oz

## 2021-03-27 LAB — FOLATE: Folate: 16 ng/mL (ref >5.9)

## 2021-03-27 LAB — MAGNESIUM: Magnesium: 1.7 mg/dL (ref 1.7–2.4)

## 2021-03-27 MED ORDER — PERFLUTREN LIPID MICROSPHERE
1.0000 mL | INTRAVENOUS | Status: AC | PRN
  Administered 2021-03-27: 3 mL via INTRAVENOUS

## 2021-03-27 MED ORDER — METOPROLOL TARTRATE 25 MG PO TABS
50.0000 mg | ORAL_TABLET | Freq: Four times a day (QID) | ORAL | Status: DC
  Administered 2021-03-27 – 2021-03-30 (×12): 50 mg via ORAL
  Filled 2021-03-27 (×12): qty 2

## 2021-03-27 MED ORDER — VITAMIN B-12 1000 MCG PO TABS
1000.0000 ug | ORAL_TABLET | Freq: Every day | ORAL | Status: DC
  Administered 2021-03-28 – 2021-04-16 (×20): 1000 ug via ORAL
  Filled 2021-03-27 (×20): qty 1

## 2021-03-27 MED ORDER — MAGNESIUM OXIDE -MG SUPPLEMENT 400 (240 MG) MG PO TABS
400.0000 mg | ORAL_TABLET | Freq: Two times a day (BID) | ORAL | Status: DC
  Administered 2021-03-27 – 2021-03-30 (×8): 400 mg via ORAL
  Filled 2021-03-27 (×8): qty 1

## 2021-03-27 NOTE — Progress Notes (Signed)
  Echocardiogram 2D Echocardiogram has been performed.  Randa Lynn Mande [place]il 03/27/2021, 3:43 PM

## 2021-03-27 NOTE — Progress Notes (Signed)
[facility] [place] for Heparin Indication: atrial fibrillation  Allergies  Allergen Reactions  . Codeine     Upset stomach   . Sulfa Antibiotics Hives and Rash    Patient Measurements: Height: 6\' 3"  (190.5 cm) Weight: (!) 182.7 kg (402 lb 12.5 oz) IBW/kg (Calculated) : 84.5 HEPARIN DW (KG): 128.4 kg   Vital Signs: Temp: 98.2 F (36.8 C) (05/13 0445) Temp Source: Axillary (05/13 0445) BP: 126/79 (05/13 0600) Pulse Rate: 134 (05/13 0600)  Labs: Recent Labs    04/05/2021 2023 03/28/2021 2147 03/25/21 0558 03/25/21 0858 03/26/21 0251 03/26/21 1131 03/27/21 0305  HGB 16.8  --  16.2  --  15.2  --  15.3  HCT 52.2*  --  50.2  --  47.6  --  48.6  PLT 164  --  160  --  144*  --  144*  HEPARINUNFRC  --   --   --    < > 0.67 0.60 0.59  CREATININE 1.02  --  1.06  --  1.08  --  1.05  TROPONINIHS 12 12  --   --   --   --   --    < > = values in this interval not displayed.    Estimated Creatinine Clearance: 106.4 mL/min (by C-G formula based on SCr of 1.05 mg/dL).  Assessment: 75 yo M with new Afib- unknown duration.  Not on anticoagulation PTA.   Morbid obesity CBC WNL. CHADS2Vasc score 3. Pharmacy consulted to start heparin.   03/27/2021:  Heparin level 0.59 - therapeutic on IV heparin 1750 units/hr  CBC: Hgb WNL, pltc 144 - decr from admission, stable from yesterday  No bleeding or infusion related issues reported by RN  SCr stable  Goal of Therapy:  Heparin level 0.3-0.7 units/ml Monitor platelets by anticoagulation protocol: Yes   Plan:  Continue heparin infusion at 1750 units/hr Daily heparin level & CBC while on heparin Monitor for s/sx of bleeding Cardiology plans for cardioversion on Monday  Peggyann Juba, PharmD, BCPS Pharmacy: [phone] 03/27/2021,7:08 AM

## 2021-03-27 NOTE — Progress Notes (Signed)
PROGRESS NOTE    Jose Fitzpatrick  BHA:[id number] DOB: 02/05/1946 DOA: 03/16/2021 PCP: Tamsen Roers, MD    No chief complaint on file.   Brief Narrative:  History of hypertension, peripheral neuropathy came  in sob/DOE/increased swelling/weight gain Found to have pulmonary edema/small bilateral effusions, A. fib/a flutter RVR  Subjective:  Urine output 4.2 L last 24 hours, remains in A. fib RVR heart rate around 130s, sbp in 130's He is tolerating Cardizem drip, heparin drip On 2liter oxygen supplement  He declined CPAP last night   Assessment & Plan:   Principal Problem:   Atrial fibrillation with RVR (HCC) Active Problems:   HTN (hypertension)   Acute CHF (congestive heart failure) (HCC)   Acute respiratory failure with hypoxia (HCC)   Elevated TSH   BMI 50.0-59.9, adult (HCC)  Afib/RVR tsh 4.8 Keep k above 4, mag above 2  echo pending, was not able to be done due to tachycardia Continue Heparin drip,  cardizem drip Continue scheduled Lopressor Cards consulted, input appreciated, plan for cardioversion on Monday  Hypokalemia/hypomagnesemia Replace, monitor   Acute on chronic heart failure exacerbation, troponin negative Presented with progressive short of breath, dyspnea on exertion, increased swelling and weight gain Found to have pulmonary edema and bilateral pleural effusion There is no documented hypoxia He does has wheezing on exam, significant volume overloaded on exam No prior echocardiogram, echo pending at this time On IV Lasix 80 mg twice daily, strict ins and out, daily weight Follow cardiology recommendation  HTN Currently on Cardizem drip and Lopressor  Microcytosis MCV 104 Possibly from alcohol B12 387 down from 640 57yrs ago Folate 16 Start b12 supplement   Body mass index is 50.34 kg/m. Need outpatient sleep study  patient agreed a trial of  CPAP at night here in the hospital, order placed  Report daily alcohol use He states he  only drink one beer a day On CIWA protocol  FTT: lives with family, progressive weakness, sit most of the day, will need PT eval once medically stable     Unresulted Labs (From admission, onward)          Start     Ordered   03/27/21 0500  Heparin level (unfractionated)  Daily,   R      03/25/21 1825   03/27/21 0500  Magnesium  Daily,   R     Question:  Specimen collection method  Answer:  Lab=Lab collect  "And" Linked Group Details   03/26/21 0928   03/26/21 9735  Basic metabolic panel  Daily,   R      03/25/21 0838   03/25/21 0500  CBC  Daily,   R      04/01/2021 2348            DVT prophylaxis: Currently on heparin drip   Code Status: Full Family Communication: Patient Disposition:   Status is: Inpatient  Dispo: The patient is from: Home              Anticipated d/c is to: Home, likely will need home health              Anticipated d/c date is: Greater than 3 days, plan for cardioversion on Monday                Consultants:   Cardiology  Procedures:   None  Antimicrobials:   Anti-infectives (From admission, onward)   None          Objective: Vitals:  03/27/21 0500 03/27/21 0600 03/27/21 0700 03/27/21 0800  BP: 121/77 126/79 126/88 (!) 138/93  Pulse: (!) 134 (!) 134 (!) 133 (!) 133  Resp: 14 15 13 14   Temp:      TempSrc:      SpO2: 95% 95% 97% 95%  Weight: (!) 182.7 kg     Height:        Intake/Output Summary (Last 24 hours) at 03/27/2021 0810 Last data filed at 03/27/2021 0600 Gross per 24 hour  Intake 779.75 ml  Output 4200 ml  Net -3420.25 ml   Filed Weights   03/26/2021 2037 03/26/21 0500 03/27/21 0500  Weight: (!) 181.4 kg (!) 215.9 kg (!) 182.7 kg    Examination:  General exam: calm, NAD, obese  Respiratory system: mild bilateral wheezing, diminished at bases Cardiovascular system: S1 & S2 heard, IRRR.  Gastrointestinal system: Abdomen is nondistended, soft and nontender.  Normal bowel sounds heard. Central nervous system:  Alert and oriented. No focal neurological deficits. Extremities: Generalized edema Skin: chronic venous stasis changes, bilateral lower extremity 3+/4+ edema Psychiatry: Judgement and insight appear normal. Mood & affect appropriate.     Data Reviewed: I have personally reviewed following labs and imaging studies  CBC: Recent Labs  Lab 04/08/2021 2023 03/25/21 0558 03/26/21 0251 03/27/21 0305  WBC 8.3 10.1 9.1 9.6  HGB 16.8 16.2 15.2 15.3  HCT 52.2* 50.2 47.6 48.6  MCV 104.0* 103.7* 104.6* 105.0*  PLT 164 160 144* 144*    Basic Metabolic Panel: Recent Labs  Lab 03/28/2021 2023 03/25/21 0558 03/26/21 0251 03/27/21 0305  NA 142 143 143 140  K 3.8 3.4* 3.2* 3.7  CL 104 100 99 96*  CO2 31 34* 36* 37*  GLUCOSE 136* 120* 113* 118*  BUN 17 17 16 16   CREATININE 1.02 1.06 1.08 1.05  CALCIUM 8.8* 8.6* 8.4* 8.1*  MG 2.2  --  1.9 1.7    GFR: Estimated Creatinine Clearance: 106.4 mL/min (by C-G formula based on SCr of 1.05 mg/dL).  Liver Function Tests: Recent Labs  Lab 04/13/2021 2023  AST 39  ALT 41  ALKPHOS 46  BILITOT 0.9  PROT 6.9  ALBUMIN 3.5    CBG: No results for input(s): GLUCAP in the last 168 hours.   Recent Results (from the past 240 hour(s))  Resp Panel by RT-PCR (Flu A&B, Covid) Nasopharyngeal Swab     Status: None   Collection Time: 04/09/2021  9:24 PM   Specimen: Nasopharyngeal Swab; Nasopharyngeal(NP) swabs in vial transport medium  Result Value Ref Range Status   SARS Coronavirus 2 by RT PCR NEGATIVE NEGATIVE Final    Comment: (NOTE) SARS-CoV-2 target nucleic acids are NOT DETECTED.  The SARS-CoV-2 RNA is generally detectable in upper respiratory specimens during the acute phase of infection. The lowest concentration of SARS-CoV-2 viral copies this assay can detect is 138 copies/mL. A negative result does not preclude SARS-Cov-2 infection and should not be used as the sole basis for treatment or other patient management decisions. A negative  result may occur with  improper specimen collection/handling, submission of specimen other than nasopharyngeal swab, presence of viral mutation(s) within the areas targeted by this assay, and inadequate number of viral copies(<138 copies/mL). A negative result must be combined with clinical observations, patient history, and epidemiological information. The expected result is Negative.  Fact Sheet for Patients:  EntrepreneurPulse.com.au  Fact Sheet for Healthcare Providers:  IncredibleEmployment.be  This test is no t yet approved or cleared by the [place] and  has been authorized for detection and/or diagnosis of SARS-CoV-2 by FDA under an Emergency Use Authorization (EUA). This EUA will remain  in effect (meaning this test can be used) for the duration of the COVID-19 declaration under Section 564(b)(1) of the Act, 21 U.S.C.section 360bbb-3(b)(1), unless the authorization is terminated  or revoked sooner.       Influenza A by PCR NEGATIVE NEGATIVE Final   Influenza B by PCR NEGATIVE NEGATIVE Final    Comment: (NOTE) The Xpert Xpress SARS-CoV-2/FLU/RSV plus assay is intended as an aid in the diagnosis of influenza from Nasopharyngeal swab specimens and should not be used as a sole basis for treatment. Nasal washings and aspirates are unacceptable for Xpert Xpress SARS-CoV-2/FLU/RSV testing.  Fact Sheet for Patients: EntrepreneurPulse.com.au  Fact Sheet for Healthcare Providers: IncredibleEmployment.be  This test is not yet approved or cleared by the [place] FDA and has been authorized for detection and/or diagnosis of SARS-CoV-2 by FDA under an Emergency Use Authorization (EUA). This EUA will remain in effect (meaning this test can be used) for the duration of the COVID-19 declaration under Section 564(b)(1) of the Act, 21 U.S.C. section 360bbb-3(b)(1), unless the authorization is  terminated or revoked.  Performed at [facility], [employer] [address], [place], [place] [address]   MRSA PCR Screening     Status: None   Collection Time: 03/25/21  8:45 AM   Specimen: Nasal Mucosa; Nasopharyngeal  Result Value Ref Range Status   MRSA by PCR NEGATIVE NEGATIVE Final    Comment:        The GeneXpert MRSA Assay (FDA approved for NASAL specimens only), is one component of a comprehensive MRSA colonization surveillance program. It is not intended to diagnose MRSA infection nor to guide or monitor treatment for MRSA infections. Performed at [facility], [place] [address], [place], [place] [address]          Radiology Studies: No results found.      Scheduled Meds: . Chlorhexidine Gluconate Cloth  6 each Topical Daily  . folic acid  1 mg Oral Daily  . furosemide  80 mg Intravenous BID  . gabapentin  600 mg Oral QPM  . ipratropium  0.5 mg Nebulization TID  . levalbuterol  1.25 mg Nebulization TID  . magnesium oxide  400 mg Oral Daily  . mouth rinse  15 mL Mouth Rinse BID  . metoprolol tartrate  25 mg Oral Q6H  . multivitamin with minerals  1 tablet Oral Daily  . potassium chloride  40 mEq Oral BID  . spironolactone  25 mg Oral Daily  . temazepam  15 mg Oral QHS  . thiamine  100 mg Oral Daily   Or  . thiamine  100 mg Intravenous Daily   Continuous Infusions: . diltiazem (CARDIZEM) infusion 15 mg/hr (03/27/21 0400)  . heparin 1,750 Units/hr (03/27/21 0400)     LOS: 3 days   Time spent: 25 mins Greater than 50% of this time was spent in counseling, explanation of diagnosis, planning of further management, and coordination of care.   Voice Recognition Viviann Spare dictation system was used to create this note, attempts have been made to correct errors. Please contact the author with questions and/or clarifications.   Florencia Reasons, MD PhD FACP [facility]  Available via Epic secure chat 7am-7pm for nonurgent  issues Please page for urgent issues To page the attending provider between 7A-7P or the covering provider during after hours 7P-7A, please log into the web site  [email or web address] and access using universal [place] password for that web site. If you do not have the password, please call the hospital operator.    03/27/2021, 8:10 AM
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
## 2021-03-28 ENCOUNTER — Encounter (HOSPITAL_COMMUNITY): Payer: Self-pay

## 2021-03-28 DIAGNOSIS — I4892 Unspecified atrial flutter: Secondary | ICD-10-CM | POA: Diagnosis not present

## 2021-03-28 DIAGNOSIS — I4891 Unspecified atrial fibrillation: Secondary | ICD-10-CM | POA: Diagnosis not present

## 2021-03-28 LAB — CBC
HCT: 48.8 % (ref 39.0–52.0)
Hemoglobin: 15.4 g/dL (ref 13.0–17.0)
MCH: 33 pg (ref 26.0–34.0)
MCHC: 31.6 g/dL (ref 30.0–36.0)
MCV: 104.7 fL — ABNORMAL HIGH (ref 80.0–100.0)
Platelets: 154 10*3/uL (ref 150–400)
RBC: 4.66 MIL/uL (ref 4.22–5.81)
RDW: 13.5 % (ref 11.5–15.5)
WBC: 9.5 10*3/uL (ref 4.0–10.5)
nRBC: 0 % (ref 0.0–0.2)

## 2021-03-28 LAB — BASIC METABOLIC PANEL
Anion gap: 7 (ref 5–15)
BUN: 18 mg/dL (ref 8–23)
CO2: 38 mmol/L — ABNORMAL HIGH (ref 22–32)
Calcium: 8.3 mg/dL — ABNORMAL LOW (ref 8.9–10.3)
Chloride: 93 mmol/L — ABNORMAL LOW (ref 98–111)
Creatinine, Ser: 1.01 mg/dL (ref 0.61–1.24)
GFR, Estimated: >60 mL/min (ref >60)
Glucose, Bld: 119 mg/dL — ABNORMAL HIGH (ref 70–99)
Potassium: 3.9 mmol/L (ref 3.5–5.1)
Sodium: 138 mmol/L (ref 135–145)

## 2021-03-28 LAB — MAGNESIUM: Magnesium: 2.1 mg/dL (ref 1.7–2.4)

## 2021-03-28 LAB — HEPARIN LEVEL (UNFRACTIONATED): Heparin Unfractionated: 0.64 IU/mL (ref 0.30–0.70)

## 2021-03-28 MED ORDER — SACUBITRIL-VALSARTAN 24-26 MG PO TABS
1.0000 | ORAL_TABLET | Freq: Two times a day (BID) | ORAL | Status: DC
  Administered 2021-03-28 – 2021-03-30 (×6): 1 via ORAL
  Filled 2021-03-28 (×7): qty 1

## 2021-03-28 MED ORDER — APIXABAN 5 MG PO TABS
5.0000 mg | ORAL_TABLET | Freq: Two times a day (BID) | ORAL | Status: DC
  Administered 2021-03-28 – 2021-04-16 (×40): 5 mg via ORAL
  Filled 2021-03-28 (×40): qty 1

## 2021-03-28 NOTE — Progress Notes (Signed)
Patient with 11 beat run of vtach-asymptomatic. Jose Fitzpatrick notified and made aware. See chart for new orders. Will continue to monitor patient.

## 2021-03-28 NOTE — Plan of Care (Signed)

## 2021-03-28 NOTE — Progress Notes (Signed)
PROGRESS NOTE    Jose Fitzpatrick  XTK:[id number] DOB: 01/02/1946 DOA: 04/08/2021 PCP: Tamsen Roers, MD    No chief complaint on file.   Brief Narrative:  History of hypertension, peripheral neuropathy came  in sob/DOE/increased swelling/weight gain Found to have pulmonary edema/small bilateral effusions, A. fib/a flutter RVR  Subjective:  Urine output 3.1 L last 24 hours, remains in A. Fib/aflutter  RVR heart rate around 130s, sbp in 130's On 2liter oxygen supplement  Edema start to going down   He requested letter to prove hospitalization , per family patient has court date on 5/24 that his lawyer trial to help him reschedule    Assessment & Plan:   Principal Problem:   Atrial fibrillation with RVR ([place]) Active Problems:   HTN (hypertension)   Acute CHF (congestive heart failure) ([place])   Acute respiratory failure with hypoxia (HCC)   Elevated TSH   BMI 50.0-59.9, adult (HCC)  Afib/aflutter/RVR tsh 4.8 Keep k above 4, mag above 2  echo pending, was not able to be done due to tachycardia Started on Heparin drip , transition to eliquis on 5/14 protocol urology recommendation Continue cardizem drip,  Continue scheduled Lopressor Cards consulted, input appreciated, plan for cardioversion on Monday  Hypokalemia/hypomagnesemia Replace, monitor   Acute on chronic heart failure exacerbation, troponin negative Presented with progressive short of breath, dyspnea on exertion, increased swelling and weight gain Found to have pulmonary edema and bilateral pleural effusion There is no documented hypoxia He does has wheezing on exam, significant volume overloaded on exam No prior echocardiogram, echo pending at this time On IV Lasix 80 mg twice daily, strict ins and out, daily weight Follow cardiology recommendation  HTN Currently on Cardizem drip and Lopressor  Microcytosis MCV 104 Possibly from alcohol B12 387 down from 640 22yrs ago Folate 16 Start b12 supplement    Body mass index is 50.12 kg/m. Need outpatient sleep study He stated he cannot tolerate CPAP due to claustrophobia  Report daily alcohol use He states he only drink one beer a day On CIWA protocol  FTT: lives with family, progressive weakness, sit most of the day, will need PT eval once medically stable     Unresulted Labs (From admission, onward)          Start     Ordered   03/29/21 9924  Basic metabolic panel  Tomorrow morning,   R       Question:  Specimen collection method  Answer:  Lab=Lab collect   03/28/21 0727            DVT prophylaxis:  on heparin drip initially, started Eliquis on May/14   Code Status: Full Family Communication: Patient Disposition:   Status is: Inpatient  Dispo: The patient is from: Home              Anticipated d/c is to: Home, likely will need home health              Anticipated d/c date is: Greater than 3 days, plan for cardioversion on Monday                Consultants:   Cardiology  Procedures:   None  Antimicrobials:   Anti-infectives (From admission, onward)   None          Objective: Vitals:   03/28/21 0600 03/28/21 0700 03/28/21 0747 03/28/21 0757  BP: (!) 143/85 135/85    Pulse: (!) 129 (!) 129    Resp: 16 18  Temp:   (!) 97 F (36.1 C)   TempSrc:   Oral   SpO2: 95% 98%  94%  Weight:      Height:        Intake/Output Summary (Last 24 hours) at 03/28/2021 [id number] Last data filed at 03/28/2021 0700 Gross per 24 hour  Intake 1081.66 ml  Output 3175 ml  Net -2093.34 ml   Filed Weights   03/26/21 0500 03/27/21 0500 03/28/21 0500  Weight: (!) 215.9 kg (!) 182.7 kg (!) 181.9 kg    Examination:  General exam: calm, NAD, obese  Respiratory system: mild bilateral wheezing, diminished at bases Cardiovascular system: S1 & S2 heard, IRRR.  Gastrointestinal system: Abdomen is nondistended, soft and nontender.  Normal bowel sounds heard. Central nervous system: Alert and oriented. No focal  neurological deficits. Extremities: Generalized edema Skin: chronic venous stasis changes, bilateral lower extremity 3+/4+ edema Psychiatry: Judgement and insight appear normal. Mood & affect appropriate.     Data Reviewed: I have personally reviewed following labs and imaging studies  CBC: Recent Labs  Lab 04/11/2021 2023 03/25/21 0558 03/26/21 0251 03/27/21 0305 03/28/21 0223  WBC 8.3 10.1 9.1 9.6 9.5  HGB 16.8 16.2 15.2 15.3 15.4  HCT 52.2* 50.2 47.6 48.6 48.8  MCV 104.0* 103.7* 104.6* 105.0* 104.7*  PLT 164 160 144* 144* [id number]    Basic Metabolic Panel: Recent Labs  Lab 04/08/2021 2023 03/25/21 0558 03/26/21 0251 03/27/21 0305 03/27/21 2120 03/28/21 0223  NA 142 143 143 140 138 138  K 3.8 3.4* 3.2* 3.7 4.0 3.9  CL 104 100 99 96* 94* 93*  CO2 31 34* 36* 37* 36* 38*  GLUCOSE 136* 120* 113* 118* 130* 119*  BUN 17 17 16 16 19 18   CREATININE 1.02 1.06 1.08 1.05 0.95 1.01  CALCIUM 8.8* 8.6* 8.4* 8.1* 8.2* 8.3*  MG 2.2  --  1.9 1.7  --  2.1    GFR: Estimated Creatinine Clearance: 110.4 mL/min (by C-G formula based on SCr of 1.01 mg/dL).  Liver Function Tests: Recent Labs  Lab 04/12/2021 2023 03/27/21 2120  AST 39 52*  ALT 41 36  ALKPHOS 46 50  BILITOT 0.9 0.9  PROT 6.9 6.7  ALBUMIN 3.5 3.3*    CBG: No results for input(s): GLUCAP in the last 168 hours.   Recent Results (from the past 240 hour(s))  Resp Panel by RT-PCR (Flu A&B, Covid) Nasopharyngeal Swab     Status: None   Collection Time: 04/05/2021  9:24 PM   Specimen: Nasopharyngeal Swab; Nasopharyngeal(NP) swabs in vial transport medium  Result Value Ref Range Status   SARS Coronavirus 2 by RT PCR NEGATIVE NEGATIVE Final    Comment: (NOTE) SARS-CoV-2 target nucleic acids are NOT DETECTED.  The SARS-CoV-2 RNA is generally detectable in upper respiratory specimens during the acute phase of infection. The lowest concentration of SARS-CoV-2 viral copies this assay can detect is 138 copies/mL. A negative  result does not preclude SARS-Cov-2 infection and should not be used as the sole basis for treatment or other patient management decisions. A negative result may occur with  improper specimen collection/handling, submission of specimen other than nasopharyngeal swab, presence of viral mutation(s) within the areas targeted by this assay, and inadequate number of viral copies(<138 copies/mL). A negative result must be combined with clinical observations, patient history, and epidemiological information. The expected result is Negative.  Fact Sheet for Patients:  EntrepreneurPulse.com.au  Fact Sheet for Healthcare Providers:  IncredibleEmployment.be  This test is no t yet  approved or cleared by the [place] and  has been authorized for detection and/or diagnosis of SARS-CoV-2 by FDA under an Emergency Use Authorization (EUA). This EUA will remain  in effect (meaning this test can be used) for the duration of the COVID-19 declaration under Section 564(b)(1) of the Act, 21 U.S.C.section 360bbb-3(b)(1), unless the authorization is terminated  or revoked sooner.       Influenza A by PCR NEGATIVE NEGATIVE Final   Influenza B by PCR NEGATIVE NEGATIVE Final    Comment: (NOTE) The Xpert Xpress SARS-CoV-2/FLU/RSV plus assay is intended as an aid in the diagnosis of influenza from Nasopharyngeal swab specimens and should not be used as a sole basis for treatment. Nasal washings and aspirates are unacceptable for Xpert Xpress SARS-CoV-2/FLU/RSV testing.  Fact Sheet for Patients: EntrepreneurPulse.com.au  Fact Sheet for Healthcare Providers: IncredibleEmployment.be  This test is not yet approved or cleared by the [place] FDA and has been authorized for detection and/or diagnosis of SARS-CoV-2 by FDA under an Emergency Use Authorization (EUA). This EUA will remain in effect (meaning this test can be used)  for the duration of the COVID-19 declaration under Section 564(b)(1) of the Act, 21 U.S.C. section 360bbb-3(b)(1), unless the authorization is terminated or revoked.  Performed at [facility], [place] [address], [place], [place] [address]   MRSA PCR Screening     Status: None   Collection Time: 03/25/21  8:45 AM   Specimen: Nasal Mucosa; Nasopharyngeal  Result Value Ref Range Status   MRSA by PCR NEGATIVE NEGATIVE Final    Comment:        The GeneXpert MRSA Assay (FDA approved for NASAL specimens only), is one component of a comprehensive MRSA colonization surveillance program. It is not intended to diagnose MRSA infection nor to guide or monitor treatment for MRSA infections. Performed at [facility], [place] [address] [place], [place] [address]          Radiology Studies: No results found.      Scheduled Meds: . apixaban  5 mg Oral BID  . Chlorhexidine Gluconate Cloth  6 each Topical Daily  . folic acid  1 mg Oral Daily  . furosemide  80 mg Intravenous BID  . gabapentin  600 mg Oral QPM  . ipratropium  0.5 mg Nebulization TID  . levalbuterol  1.25 mg Nebulization TID  . magnesium oxide  400 mg Oral BID  . mouth rinse  15 mL Mouth Rinse BID  . metoprolol tartrate  50 mg Oral Q6H  . multivitamin with minerals  1 tablet Oral Daily  . potassium chloride  40 mEq Oral BID  . sacubitril-valsartan  1 tablet Oral BID  . spironolactone  25 mg Oral Daily  . temazepam  15 mg Oral QHS  . thiamine  100 mg Oral Daily   Or  . thiamine  100 mg Intravenous Daily  . vitamin B-12  1,000 mcg Oral Daily   Continuous Infusions: . diltiazem (CARDIZEM) infusion 15 mg/hr (03/28/21 0600)     LOS: 4 days   Time spent: 25 mins Greater than 50% of this time was spent in counseling, explanation of diagnosis, planning of further management, and coordination of care.   Voice Recognition Jose Fitzpatrick dictation system was used to create this note,  attempts have been made to correct errors. Please contact the author with questions and/or clarifications.   Florencia Reasons, MD PhD FACP [facility]  Available via Epic secure chat 7am-7pm for  nonurgent issues Please page for urgent issues To page the attending provider between 7A-7P or the covering provider during after hours 7P-7A, please log into the web site [email or web address] and access using universal [place] password for that web site. If you do not have the password, please call the hospital operator.    03/28/2021, 8:33 AM

## 2021-03-28 NOTE — Progress Notes (Addendum)
Progress Note  Patient Name: Jose Fitzpatrick Date of Encounter: 03/28/2021  [facility] Cardiologist: Kirk Ruths, MD   Subjective   Denies dyspnea or CP  Inpatient Medications    Scheduled Meds: . Chlorhexidine Gluconate Cloth  6 each Topical Daily  . folic acid  1 mg Oral Daily  . furosemide  80 mg Intravenous BID  . gabapentin  600 mg Oral QPM  . ipratropium  0.5 mg Nebulization TID  . levalbuterol  1.25 mg Nebulization TID  . magnesium oxide  400 mg Oral BID  . mouth rinse  15 mL Mouth Rinse BID  . metoprolol tartrate  50 mg Oral Q6H  . multivitamin with minerals  1 tablet Oral Daily  . potassium chloride  40 mEq Oral BID  . spironolactone  25 mg Oral Daily  . temazepam  15 mg Oral QHS  . thiamine  100 mg Oral Daily   Or  . thiamine  100 mg Intravenous Daily  . vitamin B-12  1,000 mcg Oral Daily   Continuous Infusions: . diltiazem (CARDIZEM) infusion 15 mg/hr (03/28/21 0600)  . heparin 1,750 Units/hr (03/28/21 0600)   PRN Meds: LORazepam **OR** LORazepam, metoprolol tartrate   Vital Signs    Vitals:   03/28/21 0000 03/28/21 0100 03/28/21 0400 03/28/21 0500  BP: (!) 144/112 125/80 (!) 143/93 (!) 141/89  Pulse: (!) 132 (!) 49 (!) 129 (!) 128  Resp: 13 13 20 15   Temp: 97.6 F (36.4 C)  (!) 97.5 F (36.4 C)   TempSrc: Axillary  Oral   SpO2: 93% 95% 94% 96%  Weight:    (!) 181.9 kg  Height:        Intake/Output Summary (Last 24 hours) at 03/28/2021 0713 Last data filed at 03/28/2021 0700 Gross per 24 hour  Intake 1081.66 ml  Output 3175 ml  Net -2093.34 ml   Last 3 Weights 03/28/2021 03/27/2021 03/26/2021  Weight (lbs) 401 lb 0.3 oz 402 lb 12.5 oz 476 lb  Weight (kg) 181.9 kg 182.7 kg 215.912 kg      Telemetry    Atrial flutter with 11 and 12 beats NSVT- Personally Reviewed   Physical Exam   GEN: No acute distress.  Obese Neck: JVD difficult to assess Cardiac: Tachycardic Respiratory: CTA, no wheeze GI: Soft, nontender, obese MS: 3+  edema; chronic skin changes. Neuro:  Nonfocal  Psych: Normal affect   Labs    High Sensitivity Troponin:   Recent Labs  Lab 03/30/2021 2023 04/01/2021 2147  TROPONINIHS 12 12      Chemistry Recent Labs  Lab 04/11/2021 2023 03/25/21 0558 03/27/21 0305 03/27/21 2120 03/28/21 0223  NA 142   < > 140 138 138  K 3.8   < > 3.7 4.0 3.9  CL 104   < > 96* 94* 93*  CO2 31   < > 37* 36* 38*  GLUCOSE 136*   < > 118* 130* 119*  BUN 17   < > 16 19 18   CREATININE 1.02   < > 1.05 0.95 1.01  CALCIUM 8.8*   < > 8.1* 8.2* 8.3*  PROT 6.9  --   --  6.7  --   ALBUMIN 3.5  --   --  3.3*  --   AST 39  --   --  52*  --   ALT 41  --   --  36  --   ALKPHOS 46  --   --  50  --  BILITOT 0.9  --   --  0.9  --   GFRNONAA >60   < > >60 >60 >60  ANIONGAP 7   < > 7 8 7    < > = values in this interval not displayed.     Hematology Recent Labs  Lab 03/26/21 0251 03/27/21 0305 03/28/21 0223  WBC 9.1 9.6 9.5  RBC 4.55 4.63 4.66  HGB 15.2 15.3 15.4  HCT 47.6 48.6 48.8  MCV 104.6* 105.0* 104.7*  MCH 33.4 33.0 33.0  MCHC 31.9 31.5 31.6  RDW 14.0 13.7 13.5  PLT 144* 144* 154    BNP Recent Labs  Lab 03/27/2021 2023  BNP 80.9     Patient Profile     75 y.o.malewith HTN, morbid obesity, chronic venous insufficiency/lymphedema (seen by vascular surgrey), colon cancer, concern for alcohol abuse, peripheral neuropathy with acute CHF and atrial flutter.   Assessment & Plan    1 new onset congestive heart failure-I/O -8725; wt 181.9 kg. Pt remains volume overloaded though diuresing well. Continue diuretics at present dose and follow renal function. Echo results pending.   2 atrial flutter-patient is in atrial flutter with rapid ventricular response (HR mildly improved).  Continue Cardizem and metoprolol for rate control.  Change IV heparin to apixaban. Scheduled for TEE cardioversion Monday. Atrial flutter likely contributing to CHF. DC cardizem once pt in sinus.  3 alcohol abuse-previously  discussed avoiding.  4 hypertension-BP elevated; add entresto 24/26 BID and follow.  5 obesity-will need outpt sleep study; needs weight loss.  For questions or updates, please contact [place] Please consult [email or web address] for contact info under        Signed, Kirk Ruths, MD  03/28/2021, 7:13 AM

## 2021-03-29 DIAGNOSIS — I5041 Acute combined systolic (congestive) and diastolic (congestive) heart failure: Secondary | ICD-10-CM

## 2021-03-29 DIAGNOSIS — I4891 Unspecified atrial fibrillation: Secondary | ICD-10-CM | POA: Diagnosis not present

## 2021-03-29 LAB — BASIC METABOLIC PANEL
Anion gap: 6 (ref 5–15)
BUN: 24 mg/dL — ABNORMAL HIGH (ref 8–23)
CO2: 39 mmol/L — ABNORMAL HIGH (ref 22–32)
Calcium: 8.4 mg/dL — ABNORMAL LOW (ref 8.9–10.3)
Chloride: 94 mmol/L — ABNORMAL LOW (ref 98–111)
Creatinine, Ser: 1.05 mg/dL (ref 0.61–1.24)
GFR, Estimated: >60 mL/min (ref >60)
Glucose, Bld: 111 mg/dL — ABNORMAL HIGH (ref 70–99)
Potassium: 4.3 mmol/L (ref 3.5–5.1)
Sodium: 139 mmol/L (ref 135–145)

## 2021-03-29 NOTE — H&P (View-Only) (Signed)
Progress Note  Patient Name: Jose Fitzpatrick Date of Encounter: 03/29/2021  [facility] Cardiologist: Kirk Ruths, MD   Subjective   Pt denies CP or dyspnea  Inpatient Medications    Scheduled Meds: . apixaban  5 mg Oral BID  . Chlorhexidine Gluconate Cloth  6 each Topical Daily  . folic acid  1 mg Oral Daily  . furosemide  80 mg Intravenous BID  . gabapentin  600 mg Oral QPM  . ipratropium  0.5 mg Nebulization TID  . levalbuterol  1.25 mg Nebulization TID  . magnesium oxide  400 mg Oral BID  . mouth rinse  15 mL Mouth Rinse BID  . metoprolol tartrate  50 mg Oral Q6H  . multivitamin with minerals  1 tablet Oral Daily  . potassium chloride  40 mEq Oral BID  . sacubitril-valsartan  1 tablet Oral BID  . spironolactone  25 mg Oral Daily  . temazepam  15 mg Oral QHS  . thiamine  100 mg Oral Daily   Or  . thiamine  100 mg Intravenous Daily  . vitamin B-12  1,000 mcg Oral Daily   Continuous Infusions: . diltiazem (CARDIZEM) infusion 15 mg/hr (03/29/21 0645)   PRN Meds: metoprolol tartrate   Vital Signs    Vitals:   03/29/21 0200 03/29/21 0400 03/29/21 0535 03/29/21 0733  BP: 117/70  112/75   Pulse: (!) 127  (!) 129   Resp: (!) 0  16   Temp:  97.9 F (36.6 C)  98.5 F (36.9 C)  TempSrc:  Oral  Oral  SpO2: 96%  95%   Weight:   (!) 183.4 kg   Height:        Intake/Output Summary (Last 24 hours) at 03/29/2021 0757 Last data filed at 03/29/2021 0500 Gross per 24 hour  Intake 132.19 ml  Output 2450 ml  Net -2317.81 ml   Last 3 Weights 03/29/2021 03/28/2021 03/27/2021  Weight (lbs) 404 lb 5.2 oz 401 lb 0.3 oz 402 lb 12.5 oz  Weight (kg) 183.4 kg 181.9 kg 182.7 kg      Telemetry    Atrial flutter- Personally Reviewed   Physical Exam   GEN: Obese NAD Neck: supple Cardiac: Tachycardic, no murmur Respiratory: CTA, no rhonchi GI: soft NT ND MS: 3+ edema; chronic skin changes. Neuro:  Grossly intact Psych: Normal affect   Labs    High  Sensitivity Troponin:   Recent Labs  Lab 04/09/2021 2023 03/21/2021 2147  TROPONINIHS 12 12      Chemistry Recent Labs  Lab 03/18/2021 2023 03/25/21 0558 03/27/21 2120 03/28/21 0223 03/29/21 0339  NA 142   < > 138 138 139  K 3.8   < > 4.0 3.9 4.3  CL 104   < > 94* 93* 94*  CO2 31   < > 36* 38* 39*  GLUCOSE 136*   < > 130* 119* 111*  BUN 17   < > 19 18 24*  CREATININE 1.02   < > 0.95 1.01 1.05  CALCIUM 8.8*   < > 8.2* 8.3* 8.4*  PROT 6.9  --  6.7  --   --   ALBUMIN 3.5  --  3.3*  --   --   AST 39  --  52*  --   --   ALT 41  --  36  --   --   ALKPHOS 46  --  50  --   --   BILITOT 0.9  --  0.9  --   --  GFRNONAA >60   < > >60 >60 >60  ANIONGAP 7   < > 8 7 6    < > = values in this interval not displayed.     Hematology Recent Labs  Lab 03/26/21 0251 03/27/21 0305 03/28/21 0223  WBC 9.1 9.6 9.5  RBC 4.55 4.63 4.66  HGB 15.2 15.3 15.4  HCT 47.6 48.6 48.8  MCV 104.6* 105.0* 104.7*  MCH 33.4 33.0 33.0  MCHC 31.9 31.5 31.6  RDW 14.0 13.7 13.5  PLT 144* 144* 154    BNP Recent Labs  Lab 2021/04/18 2023  BNP 80.9     Patient Profile     75 y.o.malewith HTN, morbid obesity, chronic venous insufficiency/lymphedema (seen by vascular surgrey), colon cancer, concern for alcohol abuse, peripheral neuropathy with acute CHF and atrial flutter.   Assessment & Plan    1 new onset congestive heart failure-I/O -11042.  Previous echocardiogram showed ejection fraction 35 to 40% though technically difficult due to patient size and tachycardia.  Possibly tachycardia mediated.  There also could be a contribution from history of alcohol use.  Continue diuresis and follow renal function.  Patient remains markedly volume overloaded.  Continue Entresto and metoprolol (transition to Toprol prior to discharge).  Hopefully LV function will improve once sinus is reestablished and heart rate improves.  2 atrial flutter-patient is in atrial flutter with rapid ventricular response (HR mildly  improved).  Continue Cardizem and metoprolol for rate control.    Continue apixaban.  Scheduled for TEE cardioversion Monday. Atrial flutter likely contributing to CHF. DC cardizem once pt in sinus.  3 alcohol abuse-previously discussed avoiding.  4 hypertension-BP improved.  Continue present medications and follow.  5 obesity-will need outpt sleep study; needs weight loss.  For questions or updates, please contact [place] Please consult [email or web address] for contact info under        Signed, Kirk Ruths, MD  03/29/2021, 7:57 AM

## 2021-03-29 NOTE — Progress Notes (Signed)
Pt declined use of cpap 

## 2021-03-29 NOTE — Progress Notes (Signed)
Progress Note  Patient Name: Jose Fitzpatrick Date of Encounter: 03/29/2021  [facility] Cardiologist: Kirk Ruths, MD   Subjective   Pt denies CP or dyspnea  Inpatient Medications    Scheduled Meds: . apixaban  5 mg Oral BID  . Chlorhexidine Gluconate Cloth  6 each Topical Daily  . folic acid  1 mg Oral Daily  . furosemide  80 mg Intravenous BID  . gabapentin  600 mg Oral QPM  . ipratropium  0.5 mg Nebulization TID  . levalbuterol  1.25 mg Nebulization TID  . magnesium oxide  400 mg Oral BID  . mouth rinse  15 mL Mouth Rinse BID  . metoprolol tartrate  50 mg Oral Q6H  . multivitamin with minerals  1 tablet Oral Daily  . potassium chloride  40 mEq Oral BID  . sacubitril-valsartan  1 tablet Oral BID  . spironolactone  25 mg Oral Daily  . temazepam  15 mg Oral QHS  . thiamine  100 mg Oral Daily   Or  . thiamine  100 mg Intravenous Daily  . vitamin B-12  1,000 mcg Oral Daily   Continuous Infusions: . diltiazem (CARDIZEM) infusion 15 mg/hr (03/29/21 0645)   PRN Meds: metoprolol tartrate   Vital Signs    Vitals:   03/29/21 0200 03/29/21 0400 03/29/21 0535 03/29/21 0733  BP: 117/70  112/75   Pulse: (!) 127  (!) 129   Resp: (!) 0  16   Temp:  97.9 F (36.6 C)  98.5 F (36.9 C)  TempSrc:  Oral  Oral  SpO2: 96%  95%   Weight:   (!) 183.4 kg   Height:        Intake/Output Summary (Last 24 hours) at 03/29/2021 0757 Last data filed at 03/29/2021 0500 Gross per 24 hour  Intake 132.19 ml  Output 2450 ml  Net -2317.81 ml   Last 3 Weights 03/29/2021 03/28/2021 03/27/2021  Weight (lbs) 404 lb 5.2 oz 401 lb 0.3 oz 402 lb 12.5 oz  Weight (kg) 183.4 kg 181.9 kg 182.7 kg      Telemetry    Atrial flutter- Personally Reviewed   Physical Exam   GEN: Obese NAD Neck: supple Cardiac: Tachycardic, no murmur Respiratory: CTA, no rhonchi GI: soft NT ND MS: 3+ edema; chronic skin changes. Neuro:  Grossly intact Psych: Normal affect   Labs    High  Sensitivity Troponin:   Recent Labs  Lab 04/09/2021 2023 03/21/2021 2147  TROPONINIHS 12 12      Chemistry Recent Labs  Lab 03/18/2021 2023 03/25/21 0558 03/27/21 2120 03/28/21 0223 03/29/21 0339  NA 142   < > 138 138 139  K 3.8   < > 4.0 3.9 4.3  CL 104   < > 94* 93* 94*  CO2 31   < > 36* 38* 39*  GLUCOSE 136*   < > 130* 119* 111*  BUN 17   < > 19 18 24*  CREATININE 1.02   < > 0.95 1.01 1.05  CALCIUM 8.8*   < > 8.2* 8.3* 8.4*  PROT 6.9  --  6.7  --   --   ALBUMIN 3.5  --  3.3*  --   --   AST 39  --  52*  --   --   ALT 41  --  36  --   --   ALKPHOS 46  --  50  --   --   BILITOT 0.9  --  0.9  --   --  GFRNONAA >60   < > >60 >60 >60  ANIONGAP 7   < > 8 7 6    < > = values in this interval not displayed.     Hematology Recent Labs  Lab 03/26/21 0251 03/27/21 0305 03/28/21 0223  WBC 9.1 9.6 9.5  RBC 4.55 4.63 4.66  HGB 15.2 15.3 15.4  HCT 47.6 48.6 48.8  MCV 104.6* 105.0* 104.7*  MCH 33.4 33.0 33.0  MCHC 31.9 31.5 31.6  RDW 14.0 13.7 13.5  PLT 144* 144* 154    BNP Recent Labs  Lab 03/15/2021 2023  BNP 80.9     Patient Profile     75 y.o.malewith HTN, morbid obesity, chronic venous insufficiency/lymphedema (seen by vascular surgrey), colon cancer, concern for alcohol abuse, peripheral neuropathy with acute CHF and atrial flutter.   Assessment & Plan    1 new onset congestive heart failure-I/O -11042.  Previous echocardiogram showed ejection fraction 35 to 40% though technically difficult due to patient size and tachycardia.  Possibly tachycardia mediated.  There also could be a contribution from history of alcohol use.  Continue diuresis and follow renal function.  Patient remains markedly volume overloaded.  Continue Entresto and metoprolol (transition to Toprol prior to discharge).  Hopefully LV function will improve once sinus is reestablished and heart rate improves.  2 atrial flutter-patient is in atrial flutter with rapid ventricular response (HR mildly  improved).  Continue Cardizem and metoprolol for rate control.    Continue apixaban.  Scheduled for TEE cardioversion Monday. Atrial flutter likely contributing to CHF. DC cardizem once pt in sinus.  3 alcohol abuse-previously discussed avoiding.  4 hypertension-BP improved.  Continue present medications and follow.  5 obesity-will need outpt sleep study; needs weight loss.  For questions or updates, please contact [place] Please consult [email or web address] for contact info under        Signed, Kirk Ruths, MD  03/29/2021, 7:57 AM

## 2021-03-29 NOTE — Plan of Care (Signed)
  Problem: Education: Goal: Knowledge of General Education information will improve Description: Including pain rating scale, medication(s)/side effects and non-pharmacologic comfort measures Outcome: Progressing   Problem: Clinical Measurements: Goal: Respiratory complications will improve Outcome: Progressing   Problem: Nutrition: Goal: Adequate nutrition will be maintained Outcome: Progressing   

## 2021-03-29 NOTE — Progress Notes (Signed)
PROGRESS NOTE    Jose Fitzpatrick  IOE:[id number] DOB: May 19, 1946 DOA: 04-22-21 PCP: Tamsen Roers, MD    No chief complaint on file.   Brief Narrative:  History of hypertension, peripheral neuropathy came  in sob/DOE/increased swelling/weight gain Found to have pulmonary edema/small bilateral effusions, A. fib/a flutter RVR  Subjective:  Urine output 2.4 L last 24 hours, remains in A. Fib/aflutter  RVR heart rate around 120s, sbp in 110's On 2liter oxygen supplement  Edema start to going down   He requested letter to prove hospitalization , per family patient has court date on 5/24 that his lawyer trial to help him reschedule , letter provided to patient on 5/15   Assessment & Plan:   Principal Problem:   Atrial fibrillation with RVR ([place]) Active Problems:   HTN (hypertension)   Acute CHF (congestive heart failure) ([place])   Acute respiratory failure with hypoxia ([facility])   Elevated TSH   BMI 50.0-59.9, adult ([place])  Afib/aflutter/RVR tsh 4.8 Keep k above 4, mag above 2  echo pending, was not able to be done due to tachycardia Started on Heparin drip , transition to eliquis on 5/14 protocol urology recommendation Continue cardizem drip,  Continue scheduled Lopressor Cards consulted, input appreciated, plan for cardioversion on Monday  Hypokalemia/hypomagnesemia Replace, monitor   Acute on chronic heart failure exacerbation, troponin negative Presented with progressive short of breath, dyspnea on exertion, increased swelling and weight gain Found to have pulmonary edema and bilateral pleural effusion There is no documented hypoxia He does has wheezing on exam, significant volume overloaded on exam No prior echocardiogram, echo pending at this time On IV Lasix 80 mg twice daily, strict ins and out, daily weight Follow cardiology recommendation  HTN Currently on Cardizem drip and Lopressor  Microcytosis MCV 104 Possibly from alcohol B12 387 down from 640 47yrs  ago Folate 16 Start b12 supplement   Body mass index is 50.54 kg/m. Need outpatient sleep study He stated he cannot tolerate CPAP due to claustrophobia  Report daily alcohol use He states he only drink one beer a day On CIWA protocol  FTT: lives with family, progressive weakness, sit most of the day, will need PT eval once medically stable     Unresulted Labs (From admission, onward)          Start     Ordered   03/20/2021 1829  Basic metabolic panel  Tomorrow morning,   R       Question:  Specimen collection method  Answer:  Lab=Lab collect   03/29/21 0840            DVT prophylaxis:  on heparin drip initially, started Eliquis on May/14   Code Status: Full Family Communication: Patient Disposition:   Status is: Inpatient  Dispo: The patient is from: Home              Anticipated d/c is to: Home, likely will need home health              Anticipated d/c date is: plan for cardioversion on Monday, needs cardiology clearance                 Consultants:   Cardiology  Procedures:   None  Antimicrobials:   Anti-infectives (From admission, onward)   None          Objective: Vitals:   03/29/21 0733 03/29/21 0800 03/29/21 0900 03/29/21 0908  BP:  119/80  117/68  Pulse:  (!) 129 (!) 128 Marland Kitchen)  128  Resp:  12 15 (!) 24  Temp: 98.5 F (36.9 C)     TempSrc: Oral     SpO2:  97% 96% 95%  Weight:      Height:        Intake/Output Summary (Last 24 hours) at 03/29/2021 1023 Last data filed at 03/29/2021 0500 Gross per 24 hour  Intake 132.19 ml  Output 2450 ml  Net -2317.81 ml   Filed Weights   03/27/21 0500 03/28/21 0500 03/29/21 0535  Weight: (!) 182.7 kg (!) 181.9 kg (!) 183.4 kg    Examination:  General exam: calm, NAD, obese  Respiratory system: mild bilateral wheezing, diminished at bases Cardiovascular system: S1 & S2 heard, IRRR.  Gastrointestinal system: Abdomen is nondistended, soft and nontender.  Normal bowel sounds heard. Central  nervous system: Alert and oriented. No focal neurological deficits. Extremities: Generalized edema Skin: chronic venous stasis changes, bilateral lower extremity 3+/4+ edema Psychiatry: Judgement and insight appear normal. Mood & affect appropriate.     Data Reviewed: I have personally reviewed following labs and imaging studies  CBC: Recent Labs  Lab 04-18-2021 2023 03/25/21 0558 03/26/21 0251 03/27/21 0305 03/28/21 0223  WBC 8.3 10.1 9.1 9.6 9.5  HGB 16.8 16.2 15.2 15.3 15.4  HCT 52.2* 50.2 47.6 48.6 48.8  MCV 104.0* 103.7* 104.6* 105.0* 104.7*  PLT 164 160 144* 144* 678    Basic Metabolic Panel: Recent Labs  Lab 2021/04/18 2023 03/25/21 0558 03/26/21 0251 03/27/21 0305 03/27/21 2120 03/28/21 0223 03/29/21 0339  NA 142   < > 143 140 138 138 139  K 3.8   < > 3.2* 3.7 4.0 3.9 4.3  CL 104   < > 99 96* 94* 93* 94*  CO2 31   < > 36* 37* 36* 38* 39*  GLUCOSE 136*   < > 113* 118* 130* 119* 111*  BUN 17   < > 16 16 19 18  24*  CREATININE 1.02   < > 1.08 1.05 0.95 1.01 1.05  CALCIUM 8.8*   < > 8.4* 8.1* 8.2* 8.3* 8.4*  MG 2.2  --  1.9 1.7  --  2.1  --    < > = values in this interval not displayed.    GFR: Estimated Creatinine Clearance: 106.7 mL/min (by C-G formula based on SCr of 1.05 mg/dL).  Liver Function Tests: Recent Labs  Lab 18-Apr-2021 2023 03/27/21 2120  AST 39 52*  ALT 41 36  ALKPHOS 46 50  BILITOT 0.9 0.9  PROT 6.9 6.7  ALBUMIN 3.5 3.3*    CBG: No results for input(s): GLUCAP in the last 168 hours.   Recent Results (from the past 240 hour(s))  Resp Panel by RT-PCR (Flu A&B, Covid) Nasopharyngeal Swab     Status: None   Collection Time: 04/18/2021  9:24 PM   Specimen: Nasopharyngeal Swab; Nasopharyngeal(NP) swabs in vial transport medium  Result Value Ref Range Status   SARS Coronavirus 2 by RT PCR NEGATIVE NEGATIVE Final    Comment: (NOTE) SARS-CoV-2 target nucleic acids are NOT DETECTED.  The SARS-CoV-2 RNA is generally detectable in upper  respiratory specimens during the acute phase of infection. The lowest concentration of SARS-CoV-2 viral copies this assay can detect is 138 copies/mL. A negative result does not preclude SARS-Cov-2 infection and should not be used as the sole basis for treatment or other patient management decisions. A negative result may occur with  improper specimen collection/handling, submission of specimen other than nasopharyngeal swab, presence of viral  mutation(s) within the areas targeted by this assay, and inadequate number of viral copies(<138 copies/mL). A negative result must be combined with clinical observations, patient history, and epidemiological information. The expected result is Negative.  Fact Sheet for Patients:  EntrepreneurPulse.com.au  Fact Sheet for Healthcare Providers:  IncredibleEmployment.be  This test is no t yet approved or cleared by the [place] FDA and  has been authorized for detection and/or diagnosis of SARS-CoV-2 by FDA under an Emergency Use Authorization (EUA). This EUA will remain  in effect (meaning this test can be used) for the duration of the COVID-19 declaration under Section 564(b)(1) of the Act, 21 U.S.C.section 360bbb-3(b)(1), unless the authorization is terminated  or revoked sooner.       Influenza A by PCR NEGATIVE NEGATIVE Final   Influenza B by PCR NEGATIVE NEGATIVE Final    Comment: (NOTE) The Xpert Xpress SARS-CoV-2/FLU/RSV plus assay is intended as an aid in the diagnosis of influenza from Nasopharyngeal swab specimens and should not be used as a sole basis for treatment. Nasal washings and aspirates are unacceptable for Xpert Xpress SARS-CoV-2/FLU/RSV testing.  Fact Sheet for Patients: EntrepreneurPulse.com.au  Fact Sheet for Healthcare Providers: IncredibleEmployment.be  This test is not yet approved or cleared by the [place] FDA and has been  authorized for detection and/or diagnosis of SARS-CoV-2 by FDA under an Emergency Use Authorization (EUA). This EUA will remain in effect (meaning this test can be used) for the duration of the COVID-19 declaration under Section 564(b)(1) of the Act, 21 U.S.C. section 360bbb-3(b)(1), unless the authorization is terminated or revoked.  Performed at [facility], [place] [address] [place], [place] [address]   MRSA PCR Screening     Status: None   Collection Time: 03/25/21  8:45 AM   Specimen: Nasal Mucosa; Nasopharyngeal  Result Value Ref Range Status   MRSA by PCR NEGATIVE NEGATIVE Final    Comment:        The GeneXpert MRSA Assay (FDA approved for NASAL specimens only), is one component of a comprehensive MRSA colonization surveillance program. It is not intended to diagnose MRSA infection nor to guide or monitor treatment for MRSA infections. Performed at [facility], [place] [address], [place], [place] [address]          Radiology Studies: No results found.      Scheduled Meds: . apixaban  5 mg Oral BID  . Chlorhexidine Gluconate Cloth  6 each Topical Daily  . folic acid  1 mg Oral Daily  . furosemide  80 mg Intravenous BID  . gabapentin  600 mg Oral QPM  . ipratropium  0.5 mg Nebulization TID  . levalbuterol  1.25 mg Nebulization TID  . magnesium oxide  400 mg Oral BID  . mouth rinse  15 mL Mouth Rinse BID  . metoprolol tartrate  50 mg Oral Q6H  . multivitamin with minerals  1 tablet Oral Daily  . potassium chloride  40 mEq Oral BID  . sacubitril-valsartan  1 tablet Oral BID  . spironolactone  25 mg Oral Daily  . temazepam  15 mg Oral QHS  . thiamine  100 mg Oral Daily   Or  . thiamine  100 mg Intravenous Daily  . vitamin B-12  1,000 mcg Oral Daily   Continuous Infusions: . diltiazem (CARDIZEM) infusion 15 mg/hr (03/29/21 0645)     LOS: 5 days   Time spent: 25 mins Greater than 50% of this time was spent in  counseling,  explanation of diagnosis, planning of further management, and coordination of care.   Voice Recognition Viviann Spare dictation system was used to create this note, attempts have been made to correct errors. Please contact the author with questions and/or clarifications.   Florencia Reasons, MD PhD FACP [facility]  Available via Epic secure chat 7am-7pm for nonurgent issues Please page for urgent issues To page the attending provider between 7A-7P or the covering provider during after hours 7P-7A, please log into the web site [email or web address] and access using universal [place] password for that web site. If you do not have the password, please call the hospital operator.    03/29/2021, 10:23 AM

## 2021-03-30 ENCOUNTER — Inpatient Hospital Stay (HOSPITAL_COMMUNITY): Payer: Medicare HMO | Admitting: Certified Registered Nurse Anesthetist

## 2021-03-30 ENCOUNTER — Encounter (HOSPITAL_COMMUNITY): Admission: EM | Disposition: E | Payer: Self-pay | Source: Home / Self Care | Attending: Internal Medicine

## 2021-03-30 ENCOUNTER — Inpatient Hospital Stay (HOSPITAL_COMMUNITY)
Admit: 2021-03-30 | Discharge: 2021-03-30 | Disposition: A | Discharge status: Home / Self Care | Payer: Medicare HMO | Attending: Physician Assistant | Admitting: Physician Assistant

## 2021-03-30 ENCOUNTER — Encounter (HOSPITAL_COMMUNITY): Payer: Self-pay | Admitting: Family Medicine

## 2021-03-30 DIAGNOSIS — I4891 Unspecified atrial fibrillation: Secondary | ICD-10-CM | POA: Diagnosis not present

## 2021-03-30 DIAGNOSIS — I5020 Unspecified systolic (congestive) heart failure: Secondary | ICD-10-CM | POA: Diagnosis not present

## 2021-03-30 DIAGNOSIS — I340 Nonrheumatic mitral (valve) insufficiency: Secondary | ICD-10-CM | POA: Diagnosis not present

## 2021-03-30 HISTORY — PX: CARDIOVERSION: SHX1299

## 2021-03-30 HISTORY — PX: TEE WITHOUT CARDIOVERSION: SHX5443

## 2021-03-30 LAB — BASIC METABOLIC PANEL
Anion gap: 7 (ref 5–15)
BUN: 28 mg/dL — ABNORMAL HIGH (ref 8–23)
CO2: 35 mmol/L — ABNORMAL HIGH (ref 22–32)
Calcium: 8.3 mg/dL — ABNORMAL LOW (ref 8.9–10.3)
Chloride: 92 mmol/L — ABNORMAL LOW (ref 98–111)
Creatinine, Ser: 0.75 mg/dL (ref 0.61–1.24)
GFR, Estimated: >60 mL/min (ref >60)
Glucose, Bld: 127 mg/dL — ABNORMAL HIGH (ref 70–99)
Potassium: 4.4 mmol/L (ref 3.5–5.1)
Sodium: 134 mmol/L — ABNORMAL LOW (ref 135–145)

## 2021-03-30 LAB — LACTIC ACID, PLASMA
Lactic Acid, Venous: 2.1 mmol/L (ref 0.5–1.9)
Lactic Acid, Venous: 1.6 mmol/L (ref 0.5–1.9)

## 2021-03-30 SURGERY — ECHOCARDIOGRAM, TRANSESOPHAGEAL
Anesthesia: General

## 2021-03-30 MED ORDER — AMIODARONE HCL 200 MG PO TABS: 200.0000 mg | ORAL_TABLET | Freq: Two times a day (BID) | ORAL | Status: DC

## 2021-03-30 MED ORDER — SODIUM CHLORIDE 0.9 % IV SOLN: INTRAVENOUS | Status: DC | PRN

## 2021-03-30 MED ORDER — BUTAMBEN-TETRACAINE-BENZOCAINE 2-2-14 % EX AERO
INHALATION_SPRAY | CUTANEOUS | Status: DC | PRN
  Administered 2021-03-30: 2 via TOPICAL

## 2021-03-30 MED ORDER — AMIODARONE HCL 200 MG PO TABS
400.0000 mg | ORAL_TABLET | Freq: Two times a day (BID) | ORAL | Status: DC
  Administered 2021-03-30 – 2021-03-31 (×2): 400 mg via ORAL
  Filled 2021-03-30 (×2): qty 2

## 2021-03-30 MED ORDER — FENTANYL CITRATE (PF) 100 MCG/2ML IJ SOLN: 25.0000 ug | INTRAMUSCULAR | Status: DC | PRN

## 2021-03-30 MED ORDER — ACETAMINOPHEN 10 MG/ML IV SOLN: 1000.0000 mg | Freq: Once | INTRAVENOUS | Status: DC | PRN

## 2021-03-30 MED ORDER — IPRATROPIUM-ALBUTEROL 0.5-2.5 (3) MG/3ML IN SOLN
3.0000 mL | Freq: Once | RESPIRATORY_TRACT | Status: AC
  Administered 2021-03-30: 3 mL via RESPIRATORY_TRACT

## 2021-03-30 MED ORDER — PHENYLEPHRINE 40 MCG/ML (10ML) SYRINGE FOR IV PUSH (FOR BLOOD PRESSURE SUPPORT)
PREFILLED_SYRINGE | INTRAVENOUS | Status: DC | PRN
  Administered 2021-03-30: 80 ug via INTRAVENOUS
  Administered 2021-03-30 (×2): 200 ug via INTRAVENOUS

## 2021-03-30 MED ORDER — PHENYLEPHRINE HCL-NACL 10-0.9 MG/250ML-% IV SOLN
INTRAVENOUS | Status: DC | PRN
  Administered 2021-03-30: 50 ug/min via INTRAVENOUS

## 2021-03-30 MED ORDER — VASOPRESSIN 20 UNIT/ML IV SOLN
INTRAVENOUS | Status: DC | PRN
  Administered 2021-03-30: 1 [IU] via INTRAVENOUS

## 2021-03-30 MED ORDER — LIDOCAINE 2% (20 MG/ML) 5 ML SYRINGE
INTRAMUSCULAR | Status: DC | PRN
  Administered 2021-03-30: 100 mg via INTRAVENOUS

## 2021-03-30 MED ORDER — METOPROLOL TARTRATE 5 MG/5ML IV SOLN
5.0000 mg | INTRAVENOUS | Status: DC | PRN
  Administered 2021-03-30: 5 mg via INTRAVENOUS
  Filled 2021-03-30: qty 5

## 2021-03-30 MED ORDER — IPRATROPIUM-ALBUTEROL 0.5-2.5 (3) MG/3ML IN SOLN
RESPIRATORY_TRACT | Status: AC
  Filled 2021-03-30: qty 3

## 2021-03-30 MED ORDER — PHENYLEPHRINE HCL-NACL 10-0.9 MG/250ML-% IV SOLN
0.0000 ug/min | INTRAVENOUS | Status: DC
  Administered 2021-03-30: 60 ug/min via INTRAVENOUS
  Administered 2021-03-30: 20 ug/min via INTRAVENOUS
  Administered 2021-03-30: 70 ug/min via INTRAVENOUS
  Administered 2021-03-30: 90 ug/min via INTRAVENOUS
  Administered 2021-03-30: 30 ug/min via INTRAVENOUS
  Administered 2021-03-30: 50 ug/min via INTRAVENOUS
  Administered 2021-03-30: 125 ug/min via INTRAVENOUS
  Administered 2021-03-30: 100 ug/min via INTRAVENOUS

## 2021-03-30 MED ORDER — DIGOXIN 125 MCG PO TABS
0.1250 mg | ORAL_TABLET | Freq: Every day | ORAL | Status: DC
  Administered 2021-03-30 – 2021-04-02 (×4): 0.125 mg via ORAL
  Filled 2021-03-30 (×4): qty 1

## 2021-03-30 MED ORDER — DEXMEDETOMIDINE (PRECEDEX) IN NS 20 MCG/5ML (4 MCG/ML) IV SYRINGE
PREFILLED_SYRINGE | INTRAVENOUS | Status: DC | PRN
  Administered 2021-03-30: 4 ug via INTRAVENOUS
  Administered 2021-03-30: 8 ug via INTRAVENOUS
  Administered 2021-03-30: 4 ug via INTRAVENOUS

## 2021-03-30 MED ORDER — PROPOFOL 500 MG/50ML IV EMUL
INTRAVENOUS | Status: DC | PRN
  Administered 2021-03-30: 50 ug/kg/min via INTRAVENOUS

## 2021-03-30 MED ORDER — ACETAMINOPHEN 325 MG PO TABS
650.0000 mg | ORAL_TABLET | Freq: Once | ORAL | Status: AC
  Administered 2021-03-30: 650 mg via ORAL
  Filled 2021-03-30: qty 2

## 2021-03-30 MED ORDER — PROMETHAZINE HCL 25 MG/ML IJ SOLN: 6.2500 mg | INTRAMUSCULAR | Status: DC | PRN

## 2021-03-30 NOTE — Progress Notes (Signed)
Upon AM assessment, patient's bedside monitor was alarming ST elevation. EKG obtained per protocol, and read " Atrial flutter with 2:1 AV conduction, septal infarct". Dr Reesa Chew paged per protocol by this RN, and EKG was reviewed.  MD also notified of hypotension, SBP 96. 0800 lasix was held by [employer]. Orders received to give 5 mg metoprolol now, to try to improve HR and in turn BP. This RN will continue to carefully monitor pt and response to interventions.

## 2021-03-30 NOTE — Discharge Instructions (Signed)

## 2021-03-30 NOTE — Interval H&P Note (Signed)
History and Physical Interval Note:  04/02/2021 10:24 AM  Jose Fitzpatrick  has presented today for surgery, with the diagnosis of AFIB.  The various methods of treatment have been discussed with the patient and family. After consideration of risks, benefits and other options for treatment, the patient has consented to  Procedure(s): TRANSESOPHAGEAL ECHOCARDIOGRAM (TEE) (N/A) CARDIOVERSION (N/A) as a surgical intervention.  The patient's history has been reviewed, patient examined, no change in status, stable for surgery.  I have reviewed the patient's chart and labs.  Questions were answered to the patient's satisfaction.     Freada Bergeron

## 2021-03-30 NOTE — Progress Notes (Signed)
Pt continues to refuse use of nocturnal cpap.

## 2021-03-30 NOTE — Progress Notes (Signed)
PROGRESS NOTE    Jose Fitzpatrick  GYB:[id number] DOB: 1946/09/15 DOA: 04/05/2021 PCP: Tamsen Roers, MD   Brief Narrative:  74-year with history of HTN, peripheral neuropathy came to the hospital complains of increasing lower extremity swelling, dyspnea on exertion and weight gain.  Patient was found to be in CHF exacerbation along with atrial flutter/atrial fibrillation with RVR.  Cardiology team was consulted.  Patient was started on Cardizem drip with as needed metoprolol.  Echocardiogram showed EF of 40%.  Due to combination of a flutter with RVR and CHF, cardioversion was planned.   Assessment & Plan:   Principal Problem:   Atrial fibrillation with RVR (HCC) Active Problems:   HTN (hypertension)   Acute CHF (congestive heart failure) (HCC)   Acute respiratory failure with hypoxia (HCC)   Elevated TSH   BMI 50.0-59.9, adult (HCC)   Atrial flutter with RVR, now in NSR - Currently patient is on Cardizem drip.  IV metoprolol as needed - Continue Eliquis - TEE cardioversion today which was successful  - Monitor electrolytes  Congestive heart failure with reduced ejection fraction, 35% - Echocardiogram shows EF of 35% but had some technically difficult due to patient's size - Ongoing diuresis - Monitor renal function.  Continue Entresto and metoprolol.  Eventual plans for Toprol  Essential hypertension - Titrate off Cardizem drip, borderline low BP. Supportive care. Bolus if needed.   Macrocytosis - Secondary to alcohol use.  Daily alcohol use - CIWA protocol    DVT prophylaxis: Eliquis Code Status: Full code Family Communication:  Met with daughter at bedside  Status is: Inpatient  Remains inpatient appropriate because:Inpatient level of care appropriate due to severity of illness  Dispo: The patient is from: Home              Anticipated d/c is to: Home              Patient currently is not medically stable to d/c. On going diuresis. TEE cardioversion  successful    Difficult to place patient No      Subjective: TEE cardioversion was successful today, Had low BP transiently requiring Neo.   Patient was at [facility] for a long period of time due to his procedure complicated wth period of hypotension therefore I was not able to see him, but I did meet with his family in person in the afternoon.  They were concerned about him coming home due to lower extremity swelling, imbalance and risk of fall.  Review of Systems Otherwise negative except as per HPI, including: Unable to perform  Examination:  Unable to perform   Objective: Vitals:   04/10/2021 0720 04/02/2021 0747 04/05/2021 0819 04/08/2021 0853  BP:  96/67  116/72  Pulse:  (!) 132  (!) 131  Resp:  (!) 21  19  Temp: 97.7 F (36.5 C)     TempSrc: Oral     SpO2:  95% 94% 93%  Weight:      Height:        Intake/Output Summary (Last 24 hours) at 04/02/2021 0920 Last data filed at 04/14/2021 0559 Gross per 24 hour  Intake --  Output 1825 ml  Net -1825 ml   Filed Weights   03/28/21 0500 03/29/21 0535 04/12/2021 0500  Weight: (!) 181.9 kg (!) 183.4 kg (!) 184.7 kg     Data Reviewed:   CBC: Recent Labs  Lab 03/22/2021 2023 03/25/21 0558 03/26/21 0251 03/27/21 0305 03/28/21 0223  WBC 8.3 10.1 9.1 9.6 9.5  HGB 16.8 16.2 15.2 15.3 15.4  HCT 52.2* 50.2 47.6 48.6 48.8  MCV 104.0* 103.7* 104.6* 105.0* 104.7*  PLT 164 160 144* 144* 295   Basic Metabolic Panel: Recent Labs  Lab 03/21/2021 2023 03/25/21 0558 03/26/21 0251 03/27/21 0305 03/27/21 2120 03/28/21 0223 03/29/21 0339 03/21/2021 0254  NA 142   < > 143 140 138 138 139 134*  K 3.8   < > 3.2* 3.7 4.0 3.9 4.3 4.4  CL 104   < > 99 96* 94* 93* 94* 92*  CO2 31   < > 36* 37* 36* 38* 39* 35*  GLUCOSE 136*   < > 113* 118* 130* 119* 111* 127*  BUN 17   < > $R'16 16 19 18 'Sr$ 24* 28*  CREATININE 1.02   < > 1.08 1.05 0.95 1.01 1.05 0.75  CALCIUM 8.8*   < > 8.4* 8.1* 8.2* 8.3* 8.4* 8.3*  MG 2.2  --  1.9 1.7  --  2.1  --   --    < > =  values in this interval not displayed.   GFR: Estimated Creatinine Clearance: 140.6 mL/min (by C-G formula based on SCr of 0.75 mg/dL). Liver Function Tests: Recent Labs  Lab 04/09/2021 2023 03/27/21 2120  AST 39 52*  ALT 41 36  ALKPHOS 46 50  BILITOT 0.9 0.9  PROT 6.9 6.7  ALBUMIN 3.5 3.3*   No results for input(s): LIPASE, AMYLASE in the last 168 hours. No results for input(s): AMMONIA in the last 168 hours. Coagulation Profile: No results for input(s): INR, PROTIME in the last 168 hours. Cardiac Enzymes: No results for input(s): CKTOTAL, CKMB, CKMBINDEX, TROPONINI in the last 168 hours. BNP (last 3 results) No results for input(s): PROBNP in the last 8760 hours. HbA1C: No results for input(s): HGBA1C in the last 72 hours. CBG: No results for input(s): GLUCAP in the last 168 hours. Lipid Profile: No results for input(s): CHOL, HDL, LDLCALC, TRIG, CHOLHDL, LDLDIRECT in the last 72 hours. Thyroid Function Tests: No results for input(s): TSH, T4TOTAL, FREET4, T3FREE, THYROIDAB in the last 72 hours. Anemia Panel: No results for input(s): VITAMINB12, FOLATE, FERRITIN, TIBC, IRON, RETICCTPCT in the last 72 hours. Sepsis Labs: No results for input(s): PROCALCITON, LATICACIDVEN in the last 168 hours.  Recent Results (from the past 240 hour(s))  Resp Panel by RT-PCR (Flu A&B, Covid) Nasopharyngeal Swab     Status: None   Collection Time: 03/15/2021  9:24 PM   Specimen: Nasopharyngeal Swab; Nasopharyngeal(NP) swabs in vial transport medium  Result Value Ref Range Status   SARS Coronavirus 2 by RT PCR NEGATIVE NEGATIVE Final    Comment: (NOTE) SARS-CoV-2 target nucleic acids are NOT DETECTED.  The SARS-CoV-2 RNA is generally detectable in upper respiratory specimens during the acute phase of infection. The lowest concentration of SARS-CoV-2 viral copies this assay can detect is 138 copies/mL. A negative result does not preclude SARS-Cov-2 infection and should not be used as the  sole basis for treatment or other patient management decisions. A negative result may occur with  improper specimen collection/handling, submission of specimen other than nasopharyngeal swab, presence of viral mutation(s) within the areas targeted by this assay, and inadequate number of viral copies(<138 copies/mL). A negative result must be combined with clinical observations, patient history, and epidemiological information. The expected result is Negative.  Fact Sheet for Patients:  EntrepreneurPulse.com.au  Fact Sheet for Healthcare Providers:  IncredibleEmployment.be  This test is no t yet approved or cleared by the [place]  FDA and  has been authorized for detection and/or diagnosis of SARS-CoV-2 by FDA under an Emergency Use Authorization (EUA). This EUA will remain  in effect (meaning this test can be used) for the duration of the COVID-19 declaration under Section 564(b)(1) of the Act, 21 U.S.C.section 360bbb-3(b)(1), unless the authorization is terminated  or revoked sooner.       Influenza A by PCR NEGATIVE NEGATIVE Final   Influenza B by PCR NEGATIVE NEGATIVE Final    Comment: (NOTE) The Xpert Xpress SARS-CoV-2/FLU/RSV plus assay is intended as an aid in the diagnosis of influenza from Nasopharyngeal swab specimens and should not be used as a sole basis for treatment. Nasal washings and aspirates are unacceptable for Xpert Xpress SARS-CoV-2/FLU/RSV testing.  Fact Sheet for Patients: EntrepreneurPulse.com.au  Fact Sheet for Healthcare Providers: IncredibleEmployment.be  This test is not yet approved or cleared by the [place] FDA and has been authorized for detection and/or diagnosis of SARS-CoV-2 by FDA under an Emergency Use Authorization (EUA). This EUA will remain in effect (meaning this test can be used) for the duration of the COVID-19 declaration under Section 564(b)(1) of the  Act, 21 U.S.C. section 360bbb-3(b)(1), unless the authorization is terminated or revoked.  Performed at [facility], [place] [address] [place], [place] [address]   MRSA PCR Screening     Status: None   Collection Time: 03/25/21  8:45 AM   Specimen: Nasal Mucosa; Nasopharyngeal  Result Value Ref Range Status   MRSA by PCR NEGATIVE NEGATIVE Final    Comment:        The GeneXpert MRSA Assay (FDA approved for NASAL specimens only), is one component of a comprehensive MRSA colonization surveillance program. It is not intended to diagnose MRSA infection nor to guide or monitor treatment for MRSA infections. Performed at [facility], [place] [address], [place], [place] [address]          Radiology Studies: No results found.      Scheduled Meds: . apixaban  5 mg Oral BID  . Chlorhexidine Gluconate Cloth  6 each Topical Daily  . folic acid  1 mg Oral Daily  . furosemide  80 mg Intravenous BID  . gabapentin  600 mg Oral QPM  . ipratropium  0.5 mg Nebulization TID  . levalbuterol  1.25 mg Nebulization TID  . magnesium oxide  400 mg Oral BID  . mouth rinse  15 mL Mouth Rinse BID  . metoprolol tartrate  50 mg Oral Q6H  . multivitamin with minerals  1 tablet Oral Daily  . potassium chloride  40 mEq Oral BID  . sacubitril-valsartan  1 tablet Oral BID  . spironolactone  25 mg Oral Daily  . temazepam  15 mg Oral QHS  . thiamine  100 mg Oral Daily   Or  . thiamine  100 mg Intravenous Daily  . vitamin B-12  1,000 mcg Oral Daily   Continuous Infusions: . diltiazem (CARDIZEM) infusion 15 mg/hr (04/06/2021 0747)     LOS: 6 days   Time spent= 35 mins    Mattingly Fountaine Arsenio Loader, MD [facility]  If 7PM-7AM, please contact night-coverage  03/19/2021, 9:20 AM

## 2021-03-30 NOTE — Anesthesia Procedure Notes (Signed)
Procedure Name: MAC Date/Time: 04/05/2021 11:36 AM Performed by: Dorthea Cove, CRNA Pre-anesthesia Checklist: Patient identified, Emergency Drugs available, Suction available, Patient being monitored and Timeout performed Patient Re-evaluated:Patient Re-evaluated prior to induction Oxygen Delivery Method: Simple face mask Preoxygenation: Pre-oxygenation with 100% oxygen Induction Type: IV induction Placement Confirmation: positive ETCO2 and CO2 detector Dental Injury: Teeth and Oropharynx as per pre-operative assessment

## 2021-03-30 NOTE — Progress Notes (Signed)
Pt refused his evening breathing treatments, stating that his breathing and wheezing gets worse after each treatment.

## 2021-03-30 NOTE — Anesthesia Preprocedure Evaluation (Signed)
Anesthesia Evaluation  Patient identified by MRN, date of birth, ID band Patient awake    Reviewed: Allergy & Precautions, NPO status , Patient's Chart, lab work & pertinent test results  Airway Mallampati: III  TM Distance: >3 FB Neck ROM: Full    Dental  (+) Teeth Intact   Pulmonary COPD,  COPD inhaler, former smoker,     + decreased breath sounds      Cardiovascular hypertension, Pt. on medications and Pt. on home beta blockers +CHF  + dysrhythmias Atrial Fibrillation  Rhythm:Irregular Rate:Tachycardia     Neuro/Psych negative neurological ROS     GI/Hepatic negative GI ROS, Neg liver ROS,   Endo/Other  negative endocrine ROS  Renal/GU negative Renal ROS  negative genitourinary   Musculoskeletal negative musculoskeletal ROS (+)   Abdominal (+) + obese,  Abdomen: soft.    Peds  Hematology negative hematology ROS (+)   Anesthesia Other Findings   Reproductive/Obstetrics                             Anesthesia Physical Anesthesia Plan  ASA: IV  Anesthesia Plan: MAC   Post-op Pain Management:    Induction: Intravenous  PONV Risk Score and Plan: 1 and Ondansetron, Dexamethasone, Propofol infusion and Treatment may vary due to age or medical condition  Airway Management Planned: Simple Face Mask, Natural Airway and Nasal Cannula  Additional Equipment: None  Intra-op Plan:   Post-operative Plan:   Informed Consent: I have reviewed the patients History and Physical, chart, labs and discussed the procedure including the risks, benefits and alternatives for the proposed anesthesia with the patient or authorized representative who has indicated his/her understanding and acceptance.     Dental advisory given  Plan Discussed with: CRNA  Anesthesia Plan Comments: (Lab Results      Component                Value               Date                      WBC                       9.5                 03/28/2021                HGB                      15.4                03/28/2021                HCT                      48.8                03/28/2021                MCV                      104.7 (H)           03/28/2021                PLT  154                 03/28/2021           Lab Results      Component                Value               Date                      NA                       134 (L)             03/20/2021                K                        4.4                 04/09/2021                CO2                      35 (H)              03/18/2021                GLUCOSE                  127 (H)             04/01/2021                BUN                      28 (H)              03/29/2021                CREATININE               0.75                04/10/2021                CALCIUM                  8.3 (L)             03/19/2021                GFRNONAA                 >60                 03/23/2021                GFRAA                    >60                 07/20/2015          )        Anesthesia Quick Evaluation

## 2021-03-30 NOTE — Progress Notes (Signed)
  Echocardiogram Echocardiogram Transesophageal has been performed.  Jose Fitzpatrick 03/29/2021, 12:09 PM

## 2021-03-30 NOTE — Progress Notes (Signed)
Cardiology Progress Note  Patient ID: Jose Fitzpatrick MRN: [id number] DOB: 07/27/1946 Date of Encounter: 04/05/2021  Primary Cardiologist: Kirk Ruths, MD  Subjective   Chief Complaint: Shortness of breath.  HPI: Status post TEE/cardioversion today.  Maintaining sinus rhythm.  EF 25-30%.  Still needs aggressive diuresis.  ROS:  All other ROS reviewed and negative. Pertinent positives noted in the HPI.     Inpatient Medications  Scheduled Meds: . amiodarone  400 mg Oral BID   Followed by  . [START ON 03/25/2021] amiodarone  200 mg Oral BID  . [MAR Hold] apixaban  5 mg Oral BID  . [MAR Hold] Chlorhexidine Gluconate Cloth  6 each Topical Daily  . [MAR Hold] folic acid  1 mg Oral Daily  . [MAR Hold] furosemide  80 mg Intravenous BID  . [MAR Hold] gabapentin  600 mg Oral QPM  . [MAR Hold] ipratropium  0.5 mg Nebulization TID  . [MAR Hold] levalbuterol  1.25 mg Nebulization TID  . [MAR Hold] magnesium oxide  400 mg Oral BID  . [MAR Hold] mouth rinse  15 mL Mouth Rinse BID  . [MAR Hold] metoprolol tartrate  50 mg Oral Q6H  . [MAR Hold] multivitamin with minerals  1 tablet Oral Daily  . [MAR Hold] potassium chloride  40 mEq Oral BID  . [MAR Hold] sacubitril-valsartan  1 tablet Oral BID  . [MAR Hold] spironolactone  25 mg Oral Daily  . [MAR Hold] temazepam  15 mg Oral QHS  . [MAR Hold] thiamine  100 mg Oral Daily   Or  . [MAR Hold] thiamine  100 mg Intravenous Daily  . [MAR Hold] vitamin B-12  1,000 mcg Oral Daily   Continuous Infusions: . acetaminophen    . [MAR Hold] diltiazem (CARDIZEM) infusion 15 mg/hr (03/29/2021 0747)  . phenylephrine (NEO-SYNEPHRINE) Adult infusion 20 mcg/min (03/28/2021 1414)   PRN Meds: acetaminophen, fentaNYL (SUBLIMAZE) injection, [MAR Hold] metoprolol tartrate, promethazine   Vital Signs   Vitals:   03/16/2021 1430 04/11/2021 1500 04/05/2021 1530 03/25/2021 1615  BP: 102/68 97/73 95/75  112/68  Pulse: 92 90 95 95  Resp: 19 19 18 18   Temp: (!) 97 F  (36.1 C) (!) 97 F (36.1 C)    TempSrc:      SpO2: 95% 92% 92% 94%  Weight:      Height:        Intake/Output Summary (Last 24 hours) at 04/14/2021 1625 Last data filed at 03/19/2021 1430 Gross per 24 hour  Intake 648.03 ml  Output 2025 ml  Net -1376.97 ml   Last 3 Weights 04/08/2021 03/21/2021 03/29/2021  Weight (lbs) 407 lb 3 oz 407 lb 3 oz 404 lb 5.2 oz  Weight (kg) 184.7 kg 184.7 kg 183.4 kg      ECG  The most recent ECG shows atrial flutter heart rate 131, 2:1 conduction, which I personally reviewed.   Physical Exam   Vitals:   04/14/2021 1430 03/23/2021 1500 03/22/2021 1530 03/21/2021 1615  BP: 102/68 97/73 95/75  112/68  Pulse: 92 90 95 95  Resp: 19 19 18 18   Temp: (!) 97 F (36.1 C) (!) 97 F (36.1 C)    TempSrc:      SpO2: 95% 92% 92% 94%  Weight:      Height:         Intake/Output Summary (Last 24 hours) at 03/15/2021 1625 Last data filed at 04/01/2021 1430 Gross per 24 hour  Intake 648.03 ml  Output 2025 ml  Net -1376.97  ml    Last 3 Weights 18-Apr-2021 Apr 18, 2021 03/29/2021  Weight (lbs) 407 lb 3 oz 407 lb 3 oz 404 lb 5.2 oz  Weight (kg) 184.7 kg 184.7 kg 183.4 kg    Body mass index is 50.9 kg/m.  General: Well nourished, well developed, in no acute distress Head: Atraumatic, normal size  Eyes: PEERLA, EOMI  Neck: Supple, JVD not assessed due to neck adiposity Endocrine: No thryomegaly Cardiac: Normal S1, S2; RRR; no murmurs, rubs, or gallops Lungs: Diminished breath sounds bilaterally Abd: Soft, nontender, no hepatomegaly  Ext: 2+ pitting edema up to the thighs, significant scrotal edema noted Musculoskeletal: No deformities, BUE and BLE strength normal and equal Skin: Warm and dry, no rashes   Neuro: Alert and oriented to person, place, time, and situation, CNII-XII grossly intact, no focal deficits  Psych: Normal mood and affect   Labs  High Sensitivity Troponin:   Recent Labs  Lab 03/28/2021 2023 04/01/2021 2147  TROPONINIHS 12 12     Cardiac  EnzymesNo results for input(s): TROPONINI in the last 168 hours. No results for input(s): TROPIPOC in the last 168 hours.  Chemistry Recent Labs  Lab 04/06/2021 2023 03/25/21 0558 03/27/21 2120 03/28/21 0223 03/29/21 0339 04-18-21 0254  NA 142   < > 138 138 139 134*  K 3.8   < > 4.0 3.9 4.3 4.4  CL 104   < > 94* 93* 94* 92*  CO2 31   < > 36* 38* 39* 35*  GLUCOSE 136*   < > 130* 119* 111* 127*  BUN 17   < > 19 18 24* 28*  CREATININE 1.02   < > 0.95 1.01 1.05 0.75  CALCIUM 8.8*   < > 8.2* 8.3* 8.4* 8.3*  PROT 6.9  --  6.7  --   --   --   ALBUMIN 3.5  --  3.3*  --   --   --   AST 39  --  52*  --   --   --   ALT 41  --  36  --   --   --   ALKPHOS 46  --  50  --   --   --   BILITOT 0.9  --  0.9  --   --   --   GFRNONAA >60   < > >60 >60 >60 >60  ANIONGAP 7   < > 8 7 6 7    < > = values in this interval not displayed.    Hematology Recent Labs  Lab 03/26/21 0251 03/27/21 0305 03/28/21 0223  WBC 9.1 9.6 9.5  RBC 4.55 4.63 4.66  HGB 15.2 15.3 15.4  HCT 47.6 48.6 48.8  MCV 104.6* 105.0* 104.7*  MCH 33.4 33.0 33.0  MCHC 31.9 31.5 31.6  RDW 14.0 13.7 13.5  PLT 144* 144* 154   BNP Recent Labs  Lab 03/26/2021 2023  BNP 80.9    DDimer No results for input(s): DDIMER in the last 168 hours.   Radiology  No results found.  Cardiac Studies  TEE personally reviewed which demonstrates EF 25 to 30%, formal report is pending  Patient Profile  Jose Fitzpatrick is a 75 y.o. male with hypertension, morbid obesity, chronic venous insufficiency, possible alcohol abuse who was admitted on 03/29/2021 with shortness of breath and acute decompensated systolic heart failure.  Found to be in atrial flutter with RVR.  Assessment & Plan   1.  Atrial flutter with RVR -Status post TEE/cardioversion this  morning.  Maintaining sinus rhythm.  We will stop his diltiazem and metoprolol.  I would like to place him on amiodarone 400 mg twice daily for 7 days and then 200 mg daily to help maintain  sinus rhythm. -Likely can be considered for an outpatient elective atrial flutter ablation.  Also may benefit from monitoring for atrial fibrillation as he may need a fib flutter ablation. -Continue Eliquis 5 mg twice daily.  2.  New onset systolic heart failure, EF 25 to 30% -Suspect this is arrhythmia related.  On Entresto and Aldactone.  Stop beta-blocker.  Stop diltiazem.  Pressures were marginal after cardioversion.  I would like to start him on digoxin 0.125 micrograms daily. -Still needs aggressive diuresis.  I will likely increase him to 120 IV twice daily.  We need to get him 3 to 5 L urine output daily. -I would be cautious with SGLT2 inhibitor.  He is likely more prone to fungal infections given his obesity. -Would recommend we treat him medically and then reevaluate his EF before we pursue ischemia evaluation.  Suspect flutter was driving this.  For questions or updates, please contact [place] Please consult [email or web address] for contact info under   Time Spent with Patient: I have spent a total of 35 minutes with patient reviewing hospital notes, telemetry, EKGs, labs and examining the patient as well as establishing an assessment and plan that was discussed with the patient.  > 50% of time was spent in direct patient care.    Signed, Addison Naegeli. Audie Box, MD, [place]  04/13/2021 4:25 PM

## 2021-03-30 NOTE — Procedures (Addendum)
Procedure: Electrical Cardioversion Indications:  Atrial Flutter  Procedure Details:  Consent: Risks of procedure as well as the alternatives and risks of each were explained to the (patient/caregiver).  Consent for procedure obtained.  Time Out: Verified patient identification, verified procedure, site/side was marked, verified correct patient position, special equipment/implants available, medications/allergies/relevent history reviewed, required imaging and test results available. PERFORMED.  Patient placed on cardiac monitor, pulse oximetry, supplemental oxygen as necessary.  Sedation given: Propofol 120mg  Pacer pads placed anterior and posterior chest.  Cardioverted 1 time(s).  Cardioversion with synchronized biphasic 200J shock.  Evaluation: Findings: Post procedure EKG shows: NSR Complications: None Patient did tolerate procedure well. He was hypotensive prior to procedure start and was maintained on neo and vaso in the case. Will have him go to CV PACU for recovery. Dilt stopped.   Time Spent Directly with the Patient:  89minutes   Freada Bergeron 03/16/2021, 11:49 AM

## 2021-03-30 NOTE — Anesthesia Postprocedure Evaluation (Signed)
Anesthesia Post Note  Patient: Jose Fitzpatrick  Procedure(s) Performed: TRANSESOPHAGEAL ECHOCARDIOGRAM (TEE) (N/A ) CARDIOVERSION (N/A )     Patient location during evaluation: Endoscopy Anesthesia Type: General Level of consciousness: awake and alert Pain management: pain level controlled Vital Signs Assessment: post-procedure vital signs reviewed and stable Respiratory status: spontaneous breathing, nonlabored ventilation, respiratory function stable and patient connected to nasal cannula oxygen Cardiovascular status: blood pressure returned to baseline and stable Postop Assessment: no apparent nausea or vomiting Anesthetic complications: no   No complications documented.  Last Vitals:  Vitals:   03/31/2021 1600 04/05/2021 1615  BP: 101/62 112/68  Pulse:  95  Resp: 18 18  Temp: (!) 36.2 C   SpO2: 94% 94%    Last Pain:  Vitals:   04/05/2021 1600  TempSrc:   PainSc: 0-No pain                 Belenda Cruise P Annalisse Minkoff

## 2021-03-30 NOTE — Progress Notes (Signed)
Ordered EKG obtained by this RN now that pt settled back into room. EKG gave STEMI notification with PACs/PVCs. Dr. Audie Box, with cardiology, notified by this RN via pager as well as [facility] and [facility]. Pt denies SOB and chest pain at this time. HR remains in 90's, BP now 116/72.   After cardiologist reviewed EKG, LA ordered and verbal orders received to give IV lasix now as long as SBP >90. This RN will continue to monitor pt and assess for changes in heart rhythm.

## 2021-03-30 NOTE — Procedures (Addendum)
     Transesophageal Echocardiogram Note  ZIQUAN FIDEL [id number] Nov 26, 1945  Procedure: Transesophageal Echocardiogram Indications: Atrial flutter  Procedure Details Consent: Obtained Time Out: Verified patient identification, verified procedure, site/side was marked, verified correct patient position, special equipment/implants available, Radiology Safety Procedures followed,  medications/allergies/relevent history reviewed, required imaging and test results available.  Performed  Medications: Propofol: 120mg  Neo: Managed by CV anesthesia  Left Ventrical:  LVEF is severely reduced 25-30% with global hypokinesis  Mitral Valve: Mild MR  Aortic Valve: Tricuspid, no AI  Tricuspid Valve: Normal structure, Mild TR  Pulmonic Valve: Normal structure; trivial PR  Left Atrium/ Left atrial appendage: No evidence of LAA thrombus. Smoke seen in the RA and LA.  Atrial septum: No shunting seen with color doppler  Aorta: Mild plaquing  Cardioversion performed immediately post-TEE. Please see separate note.    Complications: No apparent complications Patient did tolerate procedure well.  Notably, the patient was hypotensive prior to start of procedure. He was started on neo per CV anesthesia. Dilt stopped post-procedure. If needed, can load with amiodarone.   Gwyndolyn Kaufman, MD 04/08/2021, 11:47 AM

## 2021-03-30 NOTE — TOC Progression Note (Signed)
Transition of Care [facility]) - Progression Note    Patient Details  Name: Sierraville DUCRE MRN: [id number] Date of Birth: [birth year]  Transition of Care [facility]) CM/SW Contact  Leeroy Cha, RN Phone Number: 04/06/2021, 9:58 AM  Clinical Narrative:    Assessment & Plan:   Principal Problem:   Atrial fibrillation with RVR ([place]) Active Problems:   HTN (hypertension)   Acute CHF (congestive heart failure) ([place])   Acute respiratory failure with hypoxia ([facility])   Elevated TSH   BMI 50.0-59.9, adult ([place])  Afib/aflutter/RVR tsh 4.8 Keep k above 4, mag above 2  echo pending, was not able to be done due to tachycardia Started on Heparin drip , transition to eliquis on 5/14 protocol urology recommendation Continue cardizem drip,  Continue scheduled Lopressor Cards consulted, input appreciated, plan for cardioversion on Monday  Hypokalemia/hypomagnesemia Replace, monitor   Acute on chronic heart failure exacerbation, troponin negative Presented with progressive short of breath, dyspnea on exertion, increased swelling and weight gain Found to have pulmonary edema and bilateral pleural effusion There is no documented hypoxia He does has wheezing on exam, significant volume overloaded on exam No prior echocardiogram, echo pending at this time On IV Lasix 80 mg twice daily, strict ins and out, daily weight Follow cardiology recommendation  HTN Currently on Cardizem drip and Lopressor  PLAN: following for toc needs and progression. Expected Discharge Plan: Home/Self Care Barriers to Discharge: Continued Medical Work up  Expected Discharge Plan and Services Expected Discharge Plan: Home/Self Care   Discharge Planning Services: CM Consult   Living arrangements for the past 2 months: Single Family Home                                       Social Determinants of Health (SDOH) Interventions    Readmission Risk Interventions No flowsheet data found.

## 2021-03-30 NOTE — Transfer of Care (Signed)
Immediate Anesthesia Transfer of Care Note  Patient: Jose Fitzpatrick  Procedure(s) Performed: TRANSESOPHAGEAL ECHOCARDIOGRAM (TEE) (N/A ) CARDIOVERSION (N/A )  Patient Location: PACU  Anesthesia Type:General  Level of Consciousness: awake, alert  and oriented  Airway & Oxygen Therapy: Patient Spontanous Breathing and Patient connected to nasal cannula oxygen  Post-op Assessment: Report given to RN and Post -op Vital signs reviewed and stable  Post vital signs: Reviewed and stable  Last Vitals:  Vitals Value Taken Time  BP 86/45 04/03/2021 1206  Temp    Pulse 79 03/28/2021 1211  Resp 16 03/15/2021 1211  SpO2 91 % 03/17/2021 1211  Vitals shown include unvalidated device data.  Last Pain:  Vitals:   03/21/2021 1035  TempSrc: Temporal  PainSc: 0-No pain     Pt on Neo gtt. See MAR. Dr. Gloris Manchester aware of BP and O2 saturations.   Patients Stated Pain Goal: 0 (53/66/44 0347)  Complications: No complications documented.

## 2021-03-31 ENCOUNTER — Inpatient Hospital Stay: Payer: Self-pay

## 2021-03-31 DIAGNOSIS — I4891 Unspecified atrial fibrillation: Secondary | ICD-10-CM | POA: Diagnosis not present

## 2021-03-31 LAB — BASIC METABOLIC PANEL
Anion gap: 5 (ref 5–15)
BUN: 39 mg/dL — ABNORMAL HIGH (ref 8–23)
CO2: 37 mmol/L — ABNORMAL HIGH (ref 22–32)
Calcium: 8.7 mg/dL — ABNORMAL LOW (ref 8.9–10.3)
Chloride: 95 mmol/L — ABNORMAL LOW (ref 98–111)
Creatinine, Ser: 1.44 mg/dL — ABNORMAL HIGH (ref 0.61–1.24)
GFR, Estimated: 51 mL/min — ABNORMAL LOW (ref >60)
Glucose, Bld: 113 mg/dL — ABNORMAL HIGH (ref 70–99)
Potassium: 5 mmol/L (ref 3.5–5.1)
Sodium: 137 mmol/L (ref 135–145)

## 2021-03-31 MED ORDER — AMIODARONE HCL IN DEXTROSE 360-4.14 MG/200ML-% IV SOLN
30.0000 mg/h | INTRAVENOUS | Status: DC
  Administered 2021-03-31 – 2021-04-05 (×14): 30 mg/h via INTRAVENOUS
  Filled 2021-03-31 (×17): qty 200

## 2021-03-31 MED ORDER — AMIODARONE IV BOLUS ONLY 150 MG/100ML
150.0000 mg | Freq: Once | INTRAVENOUS | Status: AC
  Administered 2021-03-31: 150 mg via INTRAVENOUS

## 2021-03-31 MED ORDER — DILTIAZEM LOAD VIA INFUSION
10.0000 mg | Freq: Once | INTRAVENOUS | Status: AC
  Administered 2021-03-31: 10 mg via INTRAVENOUS
  Filled 2021-03-31: qty 10

## 2021-03-31 MED ORDER — DILTIAZEM HCL-DEXTROSE 125-5 MG/125ML-% IV SOLN (PREMIX)
5.0000 mg/h | INTRAVENOUS | Status: DC
Start: 2021-03-31 — End: 2021-03-31
  Administered 2021-03-31: 5 mg/h via INTRAVENOUS
  Filled 2021-03-31: qty 125

## 2021-03-31 MED ORDER — AMIODARONE HCL IN DEXTROSE 360-4.14 MG/200ML-% IV SOLN
60.0000 mg/h | INTRAVENOUS | Status: AC
  Administered 2021-03-31 (×2): 60 mg/h via INTRAVENOUS
  Filled 2021-03-31: qty 200

## 2021-03-31 MED ORDER — FUROSEMIDE 10 MG/ML IJ SOLN
160.0000 mg | Freq: Two times a day (BID) | INTRAVENOUS | Status: DC
  Administered 2021-03-31 – 2021-04-02 (×5): 160 mg via INTRAVENOUS
  Filled 2021-03-31: qty 10
  Filled 2021-03-31: qty 16
  Filled 2021-03-31: qty 10
  Filled 2021-03-31: qty 16
  Filled 2021-03-31: qty 10

## 2021-03-31 MED ORDER — SACUBITRIL-VALSARTAN 24-26 MG PO TABS
1.0000 | ORAL_TABLET | Freq: Two times a day (BID) | ORAL | Status: DC
  Administered 2021-04-01 – 2021-04-03 (×6): 1 via ORAL
  Filled 2021-03-31 (×7): qty 1

## 2021-03-31 MED ORDER — TESTOSTERONE CYPIONATE 200 MG/ML IM SOLN
200.0000 mg | INTRAMUSCULAR | Status: DC
  Administered 2021-03-31 – 2021-04-14 (×2): 200 mg via INTRAMUSCULAR
  Filled 2021-03-31: qty 1

## 2021-03-31 NOTE — Progress Notes (Signed)
Pt continues to refuse nocturnal CPAP.  Pt encouraged to contact RT should he change his mind.

## 2021-03-31 NOTE — Progress Notes (Signed)
Pt returned to Afib RVR, confirmed by EKG. HR up to high 160s, BP stable at this time. Pt remains on 2 L [place] with no c/o chest pain or SOB. Cardiology and triad paged by this RN, awaiting orders at this time. RN will continue to carefully monitor pt.

## 2021-03-31 NOTE — Progress Notes (Addendum)
Cardiology Progress Note  Patient ID: Jose Fitzpatrick MRN: [id number] DOB: July 18, 1946 Date of Encounter: 03/31/2021  Primary Cardiologist: Kirk Ruths, MD  Subjective   Chief Complaint: Shortness of breath.  HPI: Status post TEE/cardioversion 05/16. EF 25-30%.   05/17: still SOB and needs O2, says gained about 100 lbs in the last 6 months or so. Had problems w/ confusion last pm, had it the night before, but that was felt 2nd Ativan  ROS:  All other ROS reviewed and negative. Pertinent positives noted in the HPI.     Inpatient Medications  Scheduled Meds: . amiodarone  400 mg Oral BID   Followed by  . [START ON 04/07/2021] amiodarone  200 mg Oral BID  . apixaban  5 mg Oral BID  . Chlorhexidine Gluconate Cloth  6 each Topical Daily  . digoxin  0.125 mg Oral Daily  . folic acid  1 mg Oral Daily  . gabapentin  600 mg Oral QPM  . ipratropium  0.5 mg Nebulization TID  . levalbuterol  1.25 mg Nebulization TID  . magnesium oxide  400 mg Oral BID  . mouth rinse  15 mL Mouth Rinse BID  . multivitamin with minerals  1 tablet Oral Daily  . potassium chloride  40 mEq Oral BID  . sacubitril-valsartan  1 tablet Oral BID  . spironolactone  25 mg Oral Daily  . temazepam  15 mg Oral QHS  . thiamine  100 mg Oral Daily   Or  . thiamine  100 mg Intravenous Daily  . vitamin B-12  1,000 mcg Oral Daily   Continuous Infusions: . furosemide     PRN Meds:    Vital Signs   Vitals:   03/31/21 0400 03/31/21 0500 03/31/21 0522 03/31/21 0700  BP:    (!) 122/54  Pulse: (!) 117 (!) 128  97  Resp: 16 17  19   Temp:   97.6 F (36.4 C)   TempSrc:   Oral   SpO2: 96% 92%  95%  Weight:  (!) 182.7 kg    Height:        Intake/Output Summary (Last 24 hours) at 03/31/2021 0747 Last data filed at 03/31/2021 0400 Gross per 24 hour  Intake 1221.91 ml  Output 975 ml  Net 246.91 ml   Last 3 Weights 03/31/2021 03/25/2021 03/31/2021  Weight (lbs) 402 lb 12.5 oz 407 lb 3 oz 407 lb 3 oz  Weight  (kg) 182.7 kg 184.7 kg 184.7 kg      ECG  The most recent ECG shows SR, PACs, bigeminal at times, which I personally reviewed.   Physical Exam   Vitals:   03/31/21 0400 03/31/21 0500 03/31/21 0522 03/31/21 0700  BP:    (!) 122/54  Pulse: (!) 117 (!) 128  97  Resp: 16 17  19   Temp:   97.6 F (36.4 C)   TempSrc:   Oral   SpO2: 96% 92%  95%  Weight:  (!) 182.7 kg    Height:         Intake/Output Summary (Last 24 hours) at 03/31/2021 0747 Last data filed at 03/31/2021 0400 Gross per 24 hour  Intake 1221.91 ml  Output 975 ml  Net 246.91 ml    Last 3 Weights 03/31/2021 03/25/2021 04/10/2021  Weight (lbs) 402 lb 12.5 oz 407 lb 3 oz 407 lb 3 oz  Weight (kg) 182.7 kg 184.7 kg 184.7 kg    Body mass index is 50.34 kg/m.  GEN: No acute distress at  rest on O2 Neck: JVD seen, difficult to assess 2nd body habitus Cardiac: slightly irreg R&R, no murmur, no rubs, or gallops.  Respiratory: diminished to auscultation bilaterally with rales in the bases. GI: Soft, nontender, non-distended  MS: 2+ long-standing edema bilat LE w/ skin changes; No deformity. Neuro:  Nonfocal  Psych: Normal affect   Labs  High Sensitivity Troponin:   Recent Labs  Lab 04/07/2021 2023 04/14/2021 2147  TROPONINIHS 12 12      Chemistry Recent Labs  Lab 03/26/2021 2023 03/25/21 0558 03/27/21 2120 03/28/21 0223 03/29/21 0339 04/03/2021 0254 03/31/21 0647  NA 142   < > 138   < > 139 134* 137  K 3.8   < > 4.0   < > 4.3 4.4 5.0  CL 104   < > 94*   < > 94* 92* 95*  CO2 31   < > 36*   < > 39* 35* 37*  GLUCOSE 136*   < > 130*   < > 111* 127* 113*  BUN 17   < > 19   < > 24* 28* 39*  CREATININE 1.02   < > 0.95   < > 1.05 0.75 1.44*  CALCIUM 8.8*   < > 8.2*   < > 8.4* 8.3* 8.7*  PROT 6.9  --  6.7  --   --   --   --   ALBUMIN 3.5  --  3.3*  --   --   --   --   AST 39  --  52*  --   --   --   --   ALT 41  --  36  --   --   --   --   ALKPHOS 46  --  50  --   --   --   --   BILITOT 0.9  --  0.9  --   --   --   --    GFRNONAA >60   < > >60   < > >60 >60 51*  ANIONGAP 7   < > 8   < > 6 7 5    < > = values in this interval not displayed.    Hematology Recent Labs  Lab 03/26/21 0251 03/27/21 0305 03/28/21 0223  WBC 9.1 9.6 9.5  RBC 4.55 4.63 4.66  HGB 15.2 15.3 15.4  HCT 47.6 48.6 48.8  MCV 104.6* 105.0* 104.7*  MCH 33.4 33.0 33.0  MCHC 31.9 31.5 31.6  RDW 14.0 13.7 13.5  PLT 144* 144* 154   BNP Recent Labs  Lab 04/10/2021 2023  BNP 80.9    DDimer No results for input(s): DDIMER in the last 168 hours.  Lab Results  Component Value Date   TSH 4.870 (H) 03/25/2021   Free T4  Date Value Ref Range Status  03/25/2021 0.88 0.61 - 1.12 ng/dL Final    Comment:    (NOTE) Biotin ingestion may interfere with free T4 tests. If the results are inconsistent with the TSH level, previous test results, or the clinical presentation, then consider biotin interference. If needed, order repeat testing after stopping biotin. Performed at [facility], [place] [address] [place], [place] [address]     Lab Results  Component Value Date   HGBA1C 5.8 (H) 02/18/2016   No results found for: CHOL, HDL, LDLCALC, LDLDIRECT, TRIG, CHOLHDL   Radiology  ECHO TEE  Result Date: 03/20/2021    TRANSESOPHOGEAL ECHO REPORT   Patient Name:  Jose Fitzpatrick Date of Exam: 03/20/2021 Medical Rec #:  [id number]         Height:       75.0 in Accession #:    [id number]        Weight:       407.2 lb Date of Birth:  November 07, 1946        BSA:          2.969 m Patient Age:    75 years          BP:           98/72 mmHg Patient Gender: M                 HR:           129 bpm. Exam Location:  Inpatient Procedure: Transesophageal Echo, Color Doppler and Cardiac Doppler Indications:     atrial fibrillation  History:         Patient has prior history of Echocardiogram examinations, most                  recent 03/27/2021. CHF, Arrythmias:Atrial Fibrillation; Risk                  Factors:Hypertension.  Sonographer:     Johny Chess Referring Phys:  [id number] N DUNN Diagnosing Phys: Gwyndolyn Kaufman MD PROCEDURE: After discussion of the risks and benefits of a TEE, an informed consent was obtained from the patient. The transesophogeal probe was passed without difficulty through the esophogus of the patient. Imaged were obtained with the patient in a left lateral decubitus position. Local oropharyngeal anesthetic was provided with Cetacaine. Sedation performed by different physician. Patients was under conscious sedation during this procedure. The patient developed no complications during the procedure. A direct current cardioversion was performed. IMPRESSIONS  1. Left ventricular ejection fraction, by estimation, is 25 to 30%. The left ventricle has severely decreased function.  2. Right ventricular systolic function is moderately reduced. The right ventricular size is normal.  3. Left atrial size was moderately dilated. No left atrial/left atrial appendage thrombus was detected.  4. Right atrial size was moderately dilated.  5. The mitral valve is normal in structure. Mild mitral valve regurgitation. No evidence of mitral stenosis.  6. The aortic valve is tricuspid. There is mild calcification of the aortic valve. There is mild thickening of the aortic valve. Aortic valve regurgitation is not visualized. Mild aortic valve sclerosis is present, with no evidence of aortic valve stenosis. FINDINGS  Left Ventricle: Left ventricular ejection fraction, by estimation, is 25 to 30%. The left ventricle has severely decreased function. The left ventricular internal cavity size was normal in size. Right Ventricle: The right ventricular size is normal. No increase in right ventricular wall thickness. Right ventricular systolic function is moderately reduced. Left Atrium: Left atrial size was moderately dilated. Spontaneous echo contrast was present. No left atrial/left atrial appendage thrombus was detected. Right Atrium: Right atrial size  was moderately dilated. Pericardium: There is no evidence of pericardial effusion. Mitral Valve: The mitral valve is normal in structure. There is mild thickening of the mitral valve leaflet(s). There is mild calcification of the mitral valve leaflet(s). Mild mitral annular calcification. Mild mitral valve regurgitation. No evidence of  mitral valve stenosis. Tricuspid Valve: The tricuspid valve is normal in structure. Tricuspid valve regurgitation is mild. Aortic Valve: The aortic valve is tricuspid. There is mild calcification of the aortic valve. There is mild thickening of the  aortic valve. Aortic valve regurgitation is not visualized. Mild aortic valve sclerosis is present, with no evidence of aortic valve stenosis. Pulmonic Valve: The pulmonic valve was normal in structure. Pulmonic valve regurgitation is not visualized. Aorta: The aortic root and ascending aorta are structurally normal, with no evidence of dilitation. IAS/Shunts: No atrial level shunt detected by color flow Doppler. Gwyndolyn Kaufman MD Electronically signed by Gwyndolyn Kaufman MD Signature Date/Time: 04/04/2021/5:45:30 PM    Final     Cardiac Studies   TEE/DCCV: 04/03/2021 PROCEDURE: After discussion of the risks and benefits of a TEE, an  informed consent was obtained from the patient. The transesophogeal probe  was passed without difficulty through the esophogus of the patient. Imaged  were obtained with the patient in a  left lateral decubitus position. Local oropharyngeal anesthetic was  provided with Cetacaine. Sedation performed by different physician.  Patients was under conscious sedation during this procedure. The patient  developed no complications during the  procedure. A direct current cardioversion was performed.   1. Left ventricular ejection fraction, by estimation, is 25 to 30%. The  left ventricle has severely decreased function.  2. Right ventricular systolic function is moderately reduced. The right   ventricular size is normal.  3. Left atrial size was moderately dilated. No left atrial/left atrial  appendage thrombus was detected.  4. Right atrial size was moderately dilated.  5. The mitral valve is normal in structure. Mild mitral valve  regurgitation. No evidence of mitral stenosis.  6. The aortic valve is tricuspid. There is mild calcification of the  aortic valve. There is mild thickening of the aortic valve. Aortic valve  regurgitation is not visualized. Mild aortic valve sclerosis is present,  with no evidence of aortic valve  stenosis.   Patient Profile  MARDELL CRAGG is a 75 y.o. male with hypertension, morbid obesity, chronic venous insufficiency, possible alcohol abuse who was admitted on Apr 08, 2021 with shortness of breath and acute decompensated systolic heart failure.  Found to be in atrial flutter with RVR.  Assessment & Plan   1.  Atrial flutter with RVR - S/P TEE/DCCV 05/16 - Dilt & metop stopped - amio started 05/16, 400 mg bid x 7 d >> 200 mg qd - f/u with EP as an outpt to consider ablation - MD advise on monitor at d/c - on Eliquis 5 mg bid - MD advise on setting up EP eval  2.  New onset systolic heart failure, EF 25 to 30% - hopefully tachy-mediated - cont Lasix, hold Entresto, aldactone d/c'd for now w/ increase in Cr - ck dig level in am - I/O are incomplete, intake under-reported, but only 1 L UOP yesterday - however, he only got 1 dose Lasix 80 mg IV yesterday in the evening - continue Lasix 160 mg IV bid - no SGLT2 inhibitor at this time - will recheck echo 6-12 weeks, then decide on ischemic eval - K+ and Mg trending up, d/c supplements  Otherwise, per IM  For questions or updates, please contact [facility] Please consult [email or web address] for contact info under   30" spent on round today, >50% w/ patient. Spent additional 15" with pt and 2 dtrs in the room discussing cardiac issues and plan of care.   Rosaria Ferries,  PA-C 03/31/2021 8:39 AM

## 2021-03-31 NOTE — Progress Notes (Signed)
PROGRESS NOTE    Jose Fitzpatrick  EXB:[id number] DOB: 1946-10-12 DOA: 03/29/2021 PCP: Tamsen Roers, MD   Brief Narrative:  74-year with history of HTN, peripheral neuropathy came to the hospital complains of increasing lower extremity swelling, dyspnea on exertion and weight gain.  Patient was found to be in CHF exacerbation along with atrial flutter/atrial fibrillation with RVR.  Cardiology team was consulted.  Patient was started on Cardizem drip with as needed metoprolol.  Echocardiogram showed EF of 40%.  Due to combination of a flutter with RVR and CHF, underwent TEE cardioversion 5/16.  Still needs aggressive diuresis.   Assessment & Plan:   Principal Problem:   Atrial fibrillation with RVR (HCC) Active Problems:   HTN (hypertension)   Acute CHF (congestive heart failure) (HCC)   Acute respiratory failure with hypoxia (HCC)   Elevated TSH   BMI 50.0-59.9, adult (HCC)   Atrial flutter with RVR, now in NSR - Monitor electrolytes.  On amiodarone 40 mg twice daily for 7 days followed by 200 mg daily. Further rate control plans per Cardiology.  - Continue Eliquis - Status post TEE cardioversion 5/16 - Monitor electrolytes  Congestive heart failure with reduced ejection fraction, 35% - Echocardiogram shows EF of 35% but had some technically difficult due to patient's size - Lasix 160 mg IV twice daily. -12.6L - Digoxin daily, Entresto, Aldactone - Medical treatment for now, may be ischemic eval down the road.  AKI - Baseline creatinine 0.7, today 1.44.  Suspect secondary to episode of hypotension yesterday along with ongoing diuresis.  Closely monitor for now.  I will discuss with cardiology to adjust any cardiac meds as appropriate  Essential hypertension - Continue meds as mentioned above  Macrocytosis - Secondary to alcohol use.  Daily alcohol use - CIWA protocol  Home testosterone injections ordered  PT/OT  DVT prophylaxis: Eliquis Code Status: Full  code Family Communication: Daughter and granddaughter at bedside  Status is: Inpatient  Remains inpatient appropriate because:Inpatient level of care appropriate due to severity of illness  Dispo: The patient is from: Home              Anticipated d/c is to: Home              Patient currently is not medically stable to d/c.  Requires aggressive ongoing diuresis.  AKI overnight likely from episode of hypotension and medications   Difficult to place patient No      Subjective: When I saw the patient in the room his heart rate was in 130s.  He denied any complaints.  Does have bilateral lower extremity swelling.  Overall tells me he feels  Review of Systems Otherwise negative except as per HPI, including: General: Denies fever, chills, night sweats or unintended weight loss. Resp: Denies cough, wheezing, shortness of breath. Cardiac: Denies chest pain, palpitations, orthopnea, paroxysmal nocturnal dyspnea. GI: Denies abdominal pain, nausea, vomiting, diarrhea or constipation GU: Denies dysuria, frequency, hesitancy or incontinence MS: Denies muscle aches, joint pain or swelling Neuro: Denies headache, neurologic deficits (focal weakness, numbness, tingling), abnormal gait Psych: Denies anxiety, depression, SI/HI/AVH Skin: Denies new rashes or lesions ID: Denies sick contacts, exotic exposures, travel  Examination:  Constitutional: Not in acute distress, 3 L nasal cannula Respiratory: Diffuse bilateral diminished breath sounds with some expiratory wheezing Cardiovascular: Normal sinus rhythm, no rubs Abdomen: Nontender nondistended good bowel sounds Musculoskeletal: 3+ bilateral lower extremity edema Skin: Bilateral lower extremity chronic skin changes Neurologic: CN 2-12 grossly intact.  And nonfocal  Psychiatric: Normal judgment and insight. Alert and oriented x 3. Normal mood.   Objective: Vitals:   03/31/21 0400 03/31/21 0500 03/31/21 0522 03/31/21 0700  BP:    (!)  122/54  Pulse: (!) 117 (!) 128  97  Resp: 16 17  19   Temp:   97.6 F (36.4 C)   TempSrc:   Oral   SpO2: 96% 92%  95%  Weight:  (!) 182.7 kg    Height:        Intake/Output Summary (Last 24 hours) at 03/31/2021 0731 Last data filed at 03/31/2021 0400 Gross per 24 hour  Intake 1221.91 ml  Output 975 ml  Net 246.91 ml   Filed Weights   03/21/2021 0500 03/27/2021 1035 03/31/21 0500  Weight: (!) 184.7 kg (!) 184.7 kg (!) 182.7 kg     Data Reviewed:   CBC: Recent Labs  Lab 04/11/2021 2023 03/25/21 0558 03/26/21 0251 03/27/21 0305 03/28/21 0223  WBC 8.3 10.1 9.1 9.6 9.5  HGB 16.8 16.2 15.2 15.3 15.4  HCT 52.2* 50.2 47.6 48.6 48.8  MCV 104.0* 103.7* 104.6* 105.0* 104.7*  PLT 164 160 144* 144* [id number]   Basic Metabolic Panel: Recent Labs  Lab 04/11/2021 2023 03/25/21 0558 03/26/21 0251 03/27/21 0305 03/27/21 2120 03/28/21 0223 03/29/21 0339 03/16/2021 0254 03/31/21 0647  NA 142   < > 143 140 138 138 139 134* 137  K 3.8   < > 3.2* 3.7 4.0 3.9 4.3 4.4 5.0  CL 104   < > 99 96* 94* 93* 94* 92* 95*  CO2 31   < > 36* 37* 36* 38* 39* 35* 37*  GLUCOSE 136*   < > 113* 118* 130* 119* 111* 127* 113*  BUN 17   < > 16 16 19 18  24* 28* 39*  CREATININE 1.02   < > 1.08 1.05 0.95 1.01 1.05 0.75 1.44*  CALCIUM 8.8*   < > 8.4* 8.1* 8.2* 8.3* 8.4* 8.3* 8.7*  MG 2.2  --  1.9 1.7  --  2.1  --   --   --    < > = values in this interval not displayed.   GFR: Estimated Creatinine Clearance: 77.6 mL/min (A) (by C-G formula based on SCr of 1.44 mg/dL (H)). Liver Function Tests: Recent Labs  Lab 04/13/2021 2023 03/27/21 2120  AST 39 52*  ALT 41 36  ALKPHOS 46 50  BILITOT 0.9 0.9  PROT 6.9 6.7  ALBUMIN 3.5 3.3*   No results for input(s): LIPASE, AMYLASE in the last 168 hours. No results for input(s): AMMONIA in the last 168 hours. Coagulation Profile: No results for input(s): INR, PROTIME in the last 168 hours. Cardiac Enzymes: No results for input(s): CKTOTAL, CKMB, CKMBINDEX, TROPONINI in  the last 168 hours. BNP (last 3 results) No results for input(s): PROBNP in the last 8760 hours. HbA1C: No results for input(s): HGBA1C in the last 72 hours. CBG: No results for input(s): GLUCAP in the last 168 hours. Lipid Profile: No results for input(s): CHOL, HDL, LDLCALC, TRIG, CHOLHDL, LDLDIRECT in the last 72 hours. Thyroid Function Tests: No results for input(s): TSH, T4TOTAL, FREET4, T3FREE, THYROIDAB in the last 72 hours. Anemia Panel: No results for input(s): VITAMINB12, FOLATE, FERRITIN, TIBC, IRON, RETICCTPCT in the last 72 hours. Sepsis Labs: Recent Labs  Lab 03/29/2021 1756 04/12/2021 2040  LATICACIDVEN 2.1* 1.6    Recent Results (from the past 240 hour(s))  Resp Panel by RT-PCR (Flu A&B, Covid) Nasopharyngeal Swab     Status:  None   Collection Time: 03/20/2021  9:24 PM   Specimen: Nasopharyngeal Swab; Nasopharyngeal(NP) swabs in vial transport medium  Result Value Ref Range Status   SARS Coronavirus 2 by RT PCR NEGATIVE NEGATIVE Final    Comment: (NOTE) SARS-CoV-2 target nucleic acids are NOT DETECTED.  The SARS-CoV-2 RNA is generally detectable in upper respiratory specimens during the acute phase of infection. The lowest concentration of SARS-CoV-2 viral copies this assay can detect is 138 copies/mL. A negative result does not preclude SARS-Cov-2 infection and should not be used as the sole basis for treatment or other patient management decisions. A negative result may occur with  improper specimen collection/handling, submission of specimen other than nasopharyngeal swab, presence of viral mutation(s) within the areas targeted by this assay, and inadequate number of viral copies(<138 copies/mL). A negative result must be combined with clinical observations, patient history, and epidemiological information. The expected result is Negative.  Fact Sheet for Patients:  EntrepreneurPulse.com.au  Fact Sheet for Healthcare Providers:   IncredibleEmployment.be  This test is no t yet approved or cleared by the [place] FDA and  has been authorized for detection and/or diagnosis of SARS-CoV-2 by FDA under an Emergency Use Authorization (EUA). This EUA will remain  in effect (meaning this test can be used) for the duration of the COVID-19 declaration under Section 564(b)(1) of the Act, 21 U.S.C.section 360bbb-3(b)(1), unless the authorization is terminated  or revoked sooner.       Influenza A by PCR NEGATIVE NEGATIVE Final   Influenza B by PCR NEGATIVE NEGATIVE Final    Comment: (NOTE) The Xpert Xpress SARS-CoV-2/FLU/RSV plus assay is intended as an aid in the diagnosis of influenza from Nasopharyngeal swab specimens and should not be used as a sole basis for treatment. Nasal washings and aspirates are unacceptable for Xpert Xpress SARS-CoV-2/FLU/RSV testing.  Fact Sheet for Patients: EntrepreneurPulse.com.au  Fact Sheet for Healthcare Providers: IncredibleEmployment.be  This test is not yet approved or cleared by the [place] FDA and has been authorized for detection and/or diagnosis of SARS-CoV-2 by FDA under an Emergency Use Authorization (EUA). This EUA will remain in effect (meaning this test can be used) for the duration of the COVID-19 declaration under Section 564(b)(1) of the Act, 21 U.S.C. section 360bbb-3(b)(1), unless the authorization is terminated or revoked.  Performed at [facility], [place] [address], [place], [place] [address]   MRSA PCR Screening     Status: None   Collection Time: 03/25/21  8:45 AM   Specimen: Nasal Mucosa; Nasopharyngeal  Result Value Ref Range Status   MRSA by PCR NEGATIVE NEGATIVE Final    Comment:        The GeneXpert MRSA Assay (FDA approved for NASAL specimens only), is one component of a comprehensive MRSA colonization surveillance program. It is not intended to diagnose  MRSA infection nor to guide or monitor treatment for MRSA infections. Performed at [facility], [place] [address] [place], [place] [address]          Radiology Studies: ECHO TEE  Result Date: 04/07/2021    TRANSESOPHOGEAL ECHO REPORT   Patient Name:   Jose Fitzpatrick Date of Exam: 03/16/2021 Medical Rec #:  [id number]         Height:       75.0 in Accession #:    [id number]        Weight:       407.2 lb Date of Birth:  May 03, 1946  BSA:          2.969 m Patient Age:    17 years          BP:           98/72 mmHg Patient Gender: M                 HR:           129 bpm. Exam Location:  Inpatient Procedure: Transesophageal Echo, Color Doppler and Cardiac Doppler Indications:     atrial fibrillation  History:         Patient has prior history of Echocardiogram examinations, most                  recent 03/27/2021. CHF, Arrythmias:Atrial Fibrillation; Risk                  Factors:Hypertension.  Sonographer:     Johny Chess Referring Phys:  [id number] M DUNN Diagnosing Phys: Gwyndolyn Kaufman MD PROCEDURE: After discussion of the risks and benefits of a TEE, an informed consent was obtained from the patient. The transesophogeal probe was passed without difficulty through the esophogus of the patient. Imaged were obtained with the patient in a left lateral decubitus position. Local oropharyngeal anesthetic was provided with Cetacaine. Sedation performed by different physician. Patients was under conscious sedation during this procedure. The patient developed no complications during the procedure. A direct current cardioversion was performed. IMPRESSIONS  1. Left ventricular ejection fraction, by estimation, is 25 to 30%. The left ventricle has severely decreased function.  2. Right ventricular systolic function is moderately reduced. The right ventricular size is normal.  3. Left atrial size was moderately dilated. No left atrial/left atrial appendage thrombus was detected.  4.  Right atrial size was moderately dilated.  5. The mitral valve is normal in structure. Mild mitral valve regurgitation. No evidence of mitral stenosis.  6. The aortic valve is tricuspid. There is mild calcification of the aortic valve. There is mild thickening of the aortic valve. Aortic valve regurgitation is not visualized. Mild aortic valve sclerosis is present, with no evidence of aortic valve stenosis. FINDINGS  Left Ventricle: Left ventricular ejection fraction, by estimation, is 25 to 30%. The left ventricle has severely decreased function. The left ventricular internal cavity size was normal in size. Right Ventricle: The right ventricular size is normal. No increase in right ventricular wall thickness. Right ventricular systolic function is moderately reduced. Left Atrium: Left atrial size was moderately dilated. Spontaneous echo contrast was present. No left atrial/left atrial appendage thrombus was detected. Right Atrium: Right atrial size was moderately dilated. Pericardium: There is no evidence of pericardial effusion. Mitral Valve: The mitral valve is normal in structure. There is mild thickening of the mitral valve leaflet(s). There is mild calcification of the mitral valve leaflet(s). Mild mitral annular calcification. Mild mitral valve regurgitation. No evidence of  mitral valve stenosis. Tricuspid Valve: The tricuspid valve is normal in structure. Tricuspid valve regurgitation is mild. Aortic Valve: The aortic valve is tricuspid. There is mild calcification of the aortic valve. There is mild thickening of the aortic valve. Aortic valve regurgitation is not visualized. Mild aortic valve sclerosis is present, with no evidence of aortic valve stenosis. Pulmonic Valve: The pulmonic valve was normal in structure. Pulmonic valve regurgitation is not visualized. Aorta: The aortic root and ascending aorta are structurally normal, with no evidence of dilitation. IAS/Shunts: No atrial level shunt detected by  color flow Doppler. Gwyndolyn Kaufman  MD Electronically signed by Gwyndolyn Kaufman MD Signature Date/Time: 04/13/2021/5:45:30 PM    Final         Scheduled Meds: . amiodarone  400 mg Oral BID   Followed by  . [START ON 04/11/2021] amiodarone  200 mg Oral BID  . apixaban  5 mg Oral BID  . Chlorhexidine Gluconate Cloth  6 each Topical Daily  . digoxin  0.125 mg Oral Daily  . folic acid  1 mg Oral Daily  . gabapentin  600 mg Oral QPM  . ipratropium  0.5 mg Nebulization TID  . levalbuterol  1.25 mg Nebulization TID  . magnesium oxide  400 mg Oral BID  . mouth rinse  15 mL Mouth Rinse BID  . multivitamin with minerals  1 tablet Oral Daily  . potassium chloride  40 mEq Oral BID  . sacubitril-valsartan  1 tablet Oral BID  . spironolactone  25 mg Oral Daily  . temazepam  15 mg Oral QHS  . thiamine  100 mg Oral Daily   Or  . thiamine  100 mg Intravenous Daily  . vitamin B-12  1,000 mcg Oral Daily   Continuous Infusions: . furosemide       LOS: 7 days   Time spent= 35 mins    Henson Fraticelli Arsenio Loader, MD [facility]  If 7PM-7AM, please contact night-coverage  03/31/2021, 7:31 AM

## 2021-03-31 NOTE — Progress Notes (Signed)
PT refused CPT at this time.  

## 2021-03-31 NOTE — Progress Notes (Signed)
Per Dr. Audie Box, cardizem bolus and infusion were stopped (mid bolus), d/t low EF. Awaiting amio gtt orders at this time. PO amio and digoxin given per verbal order from MD. This RN will continue to carefully monitor pt.

## 2021-03-31 NOTE — Progress Notes (Signed)
   [facility] boots ordered  Rosaria Ferries, PA-C 03/31/2021 8:27 PM

## 2021-03-31 NOTE — Progress Notes (Signed)
  Amiodarone Drug - Drug Interaction Consult Note  Recommendations: Has digoxin level ordered for 5/18  Amiodarone is metabolized by the cytochrome P450 system and therefore has the potential to cause many drug interactions. Amiodarone has an average plasma half-life of 50 days (range 20 to 100 days).   There is potential for drug interactions to occur several weeks or months after stopping treatment and the onset of drug interactions may be slow after initiating amiodarone.   []  Statins: Increased risk of myopathy. Simvastatin- restrict dose to 20mg  daily. Other statins: counsel patients to report any muscle pain or weakness immediately.  []  Anticoagulants: Amiodarone can increase anticoagulant effect. Consider warfarin dose reduction. Patients should be monitored closely and the dose of anticoagulant altered accordingly, remembering that amiodarone levels take several weeks to stabilize.  []  Antiepileptics: Amiodarone can increase plasma concentration of phenytoin, the dose should be reduced. Note that small changes in phenytoin dose can result in large changes in levels. Monitor patient and counsel on signs of toxicity.  []  Beta blockers: increased risk of bradycardia, AV block and myocardial depression. Sotalol - avoid concomitant use.  []   Calcium channel blockers (diltiazem and verapamil): increased risk of bradycardia, AV block and myocardial depression.  []   Cyclosporine: Amiodarone increases levels of cyclosporine. Reduced dose of cyclosporine is recommended.  [x]  Digoxin dose should be halved when amiodarone is started.   [x]  Diuretics: increased risk of cardiotoxicity if hypokalemia occurs.  []  Oral hypoglycemic agents (glyburide, glipizide, glimepiride): increased risk of hypoglycemia. Patient's glucose levels should be monitored closely when initiating amiodarone therapy.   []  Drugs that prolong the QT interval:  Torsades de pointes risk may be increased with concurrent use -  avoid if possible.  Monitor QTc, also keep magnesium/potassium WNL if concurrent therapy can't be avoided. Marland Kitchen Antibiotics: e.g. fluoroquinolones, erythromycin. . Antiarrhythmics: e.g. quinidine, procainamide, disopyramide, sotalol. . Antipsychotics: e.g. phenothiazines, haloperidol.  . Lithium, tricyclic antidepressants, and methadone. Thank You,   Eudelia Bunch, Pharm.D 03/31/2021 11:22 AM

## 2021-04-01 DIAGNOSIS — I4891 Unspecified atrial fibrillation: Secondary | ICD-10-CM | POA: Diagnosis not present

## 2021-04-01 LAB — CBC
HCT: 50.6 % (ref 39.0–52.0)
Hemoglobin: 16.2 g/dL (ref 13.0–17.0)
MCH: 33.3 pg (ref 26.0–34.0)
MCHC: 32 g/dL (ref 30.0–36.0)
MCV: 104.1 fL — ABNORMAL HIGH (ref 80.0–100.0)
Platelets: 150 10*3/uL (ref 150–400)
RBC: 4.86 MIL/uL (ref 4.22–5.81)
RDW: 13.4 % (ref 11.5–15.5)
WBC: 8.2 10*3/uL (ref 4.0–10.5)
nRBC: 0 % (ref 0.0–0.2)

## 2021-04-01 LAB — BASIC METABOLIC PANEL
Anion gap: 7 (ref 5–15)
BUN: 37 mg/dL — ABNORMAL HIGH (ref 8–23)
CO2: 36 mmol/L — ABNORMAL HIGH (ref 22–32)
Calcium: 8.4 mg/dL — ABNORMAL LOW (ref 8.9–10.3)
Chloride: 93 mmol/L — ABNORMAL LOW (ref 98–111)
Creatinine, Ser: 1.22 mg/dL (ref 0.61–1.24)
GFR, Estimated: >60 mL/min (ref >60)
Glucose, Bld: 122 mg/dL — ABNORMAL HIGH (ref 70–99)
Potassium: 4.8 mmol/L (ref 3.5–5.1)
Sodium: 136 mmol/L (ref 135–145)

## 2021-04-01 LAB — DIGOXIN LEVEL: Digoxin Level: <0.2 ng/mL — ABNORMAL LOW (ref 0.8–2.0)

## 2021-04-01 LAB — COOXEMETRY PANEL
Carboxyhemoglobin: 1.1 % (ref 0.5–1.5)
Methemoglobin: 0.9 % (ref 0.0–1.5)
O2 Saturation: 72.9 %
Total hemoglobin: 16.5 g/dL — ABNORMAL HIGH (ref 12.0–16.0)

## 2021-04-01 LAB — MAGNESIUM: Magnesium: 2.3 mg/dL (ref 1.7–2.4)

## 2021-04-01 MED ORDER — LEVALBUTEROL TARTRATE 45 MCG/ACT IN AERO: 2.0000 | INHALATION_SPRAY | Freq: Four times a day (QID) | RESPIRATORY_TRACT | Status: DC | PRN

## 2021-04-01 MED ORDER — SODIUM CHLORIDE 0.9% FLUSH
10.0000 mL | Freq: Two times a day (BID) | INTRAVENOUS | Status: DC
  Administered 2021-04-01 – 2021-04-02 (×3): 10 mL
  Administered 2021-04-02: 20 mL
  Administered 2021-04-03: 30 mL
  Administered 2021-04-03: 10 mL
  Administered 2021-04-04: 20 mL
  Administered 2021-04-04 – 2021-04-05 (×2): 10 mL

## 2021-04-01 MED ORDER — LEVALBUTEROL TARTRATE 45 MCG/ACT IN AERO
2.0000 | INHALATION_SPRAY | Freq: Three times a day (TID) | RESPIRATORY_TRACT | Status: DC
  Administered 2021-04-02: 2 via RESPIRATORY_TRACT
  Filled 2021-04-01: qty 15

## 2021-04-01 MED ORDER — SODIUM CHLORIDE 0.9% FLUSH: 10.0000 mL | INTRAVENOUS | Status: DC | PRN

## 2021-04-01 NOTE — Progress Notes (Signed)
Wheezing and rhonchi auscultated in upper respiratory lobes. Patient educated on benefits of ordered nebs and encouraged to accept the treatment. Patient refuses and states "it makes me breath worse." Patient was also educated on benefits of ordered CPAP at night. Patient also refuses to wear CPAP, states he does not use it at home and does not feel that he needs it.  Patient states he uses an inhaler at home. RT at bedside.  Patient on 4L Andrew SpO2 @ 97% sitting up at 90 degrees. Respirations regular equal and unlabored. Will continue to assess.

## 2021-04-01 NOTE — Evaluation (Signed)
Physical Therapy Evaluation Patient Details Name: Jose Fitzpatrick MRN: [id number] DOB: [birth year] Today's Date: 04/01/2021   History of Present Illness  74-year came to the hospital complains of increasing lower extremity swelling, dyspnea on exertion and weight gain.  Patient was found to be in CHF exacerbation along with atrial flutter/atrial fibrillation with RVR. s/p cardioversion 03/23/2021.  Pt with history of HTN, peripheral neuropathy  Clinical Impression  Pt admitted with above diagnosis. +2 max to total assist for bed mobility. Pt sat at edge of bed for ~6 minutes, but became very dizzy, so was assisted back to supine. BP supine 115/58, sitting 117/67. Pt will likely require ST-SNF for rehab as his mobility is significantly limited.  Pt currently with functional limitations due to the deficits listed below (see PT Problem List). Pt will benefit from skilled PT to increase their independence and safety with mobility to allow discharge to the venue listed below.       Follow Up Recommendations SNF;Supervision for mobility/OOB    Equipment Recommendations  None recommended by PT    Recommendations for Other Services       Precautions / Restrictions Precautions Precautions: Fall Precaution Comments: 1 fall in past 6 months Restrictions Weight Bearing Restrictions: No      Mobility  Bed Mobility Overal bed mobility: Needs Assistance Bed Mobility: Supine to Sit;Sit to Supine     Supine to sit: +2 for physical assistance;Max assist Sit to supine: Total assist;+2 for physical assistance   General bed mobility comments: pt able to assist with advancing BUEs to EOB and with raising trunk, using bedrails to pull up but needed max assist to complete these movements; increased time and effort for all mobility. Pt sat EOB ~6 minutes but became very lightheaded and felt he was going to pass out, so was assisted back to supine. BP supine 115/58, sitting 117/67. Total assist of 2 to  scoot up in bed. SaO2 86% on room air, 92% on 3L O2.    Transfers                 General transfer comment: NT- pt dizzy in sitting (see flowsheets for BP, pt was not orthostatic)  Ambulation/Gait                Stairs            Wheelchair Mobility    Modified Rankin (Stroke Patients Only)       Balance Overall balance assessment: History of Falls;Needs assistance Sitting-balance support: Feet supported;Single extremity supported Sitting balance-Leahy Scale: Fair                                       Pertinent Vitals/Pain Pain Assessment: No/denies pain    Home Living Family/patient expects to be discharged to:: Private residence Living Arrangements: Alone Available Help at Discharge: Family;Available 24 hours/day Type of Home: House Home Access: Stairs to enter   [employer] of Steps: 1 Home Layout: One level Home Equipment: Cane - single point      Prior Function Level of Independence: Independent with assistive device(s)         Comments: walks with SPC, drives, uses cane; dressing has been labored for ~1 month, family has gotten groceries for about a month, daughter and granddaughter live 1 mile away ("one of them could move in with me")     Hand Dominance  Extremity/Trunk Assessment   Upper Extremity Assessment Upper Extremity Assessment: Defer to OT evaluation    Lower Extremity Assessment Lower Extremity Assessment: RLE deficits/detail;LLE deficits/detail RLE Deficits / Details: B feet and ankles with severe edema which limits mobility, able to wiggle toes and feet, B knee ext -4/5 RLE Sensation: history of peripheral neuropathy LLE Deficits / Details: B feet and ankles with severe edema which limits mobility, able to wiggle toes and feet, B knee ext -4/5 LLE Sensation: history of peripheral neuropathy       Communication   Communication: No difficulties  Cognition Arousal/Alertness:  Awake/alert Behavior During Therapy: WFL for tasks assessed/performed Overall Cognitive Status: Within Functional Limits for tasks assessed                                        General Comments      Exercises General Exercises - Lower Extremity Ankle Circles/Pumps: AROM;Both;10 reps;Supine Short Arc Quad: AROM;Both;5 reps;Supine Long Arc Quad: AROM;Both;5 reps;Seated   Assessment/Plan    PT Assessment Patient needs continued PT services  PT Problem List Decreased mobility;Decreased activity tolerance       PT Treatment Interventions Therapeutic activities;Therapeutic exercise;Patient/family education;Gait training;DME instruction    PT Goals (Current goals can be found in the Care Plan section)  Acute Rehab PT Goals Patient Stated Goal: hopes to DC home with assistance from daughter and granddaughter PT Goal Formulation: With patient Time For Goal Achievement: 04/15/21 Potential to Achieve Goals: Fair    Frequency Min 2X/week   Barriers to discharge        Co-evaluation PT/OT/SLP Co-Evaluation/Treatment: Yes Reason for Co-Treatment: Complexity of the patient's impairments (multi-system involvement);For patient/therapist safety;To address functional/ADL transfers PT goals addressed during session: Mobility/safety with mobility;Balance;Strengthening/ROM         AM-PAC PT "6 Clicks" Mobility  Outcome Measure Help needed turning from your back to your side while in a flat bed without using bedrails?: Total Help needed moving from lying on your back to sitting on the side of a flat bed without using bedrails?: Total Help needed moving to and from a bed to a chair (including a wheelchair)?: Total Help needed standing up from a chair using your arms (e.g., wheelchair or bedside chair)?: Total Help needed to walk in hospital room?: Total Help needed climbing 3-5 steps with a railing? : Total 6 Click Score: 6    End of Session Equipment Utilized During  Treatment: Oxygen Activity Tolerance: Treatment limited secondary to medical complications (Comment) (dizzy in sitting) Patient left: in bed;with call bell/phone within reach;with bed alarm set Nurse Communication: Mobility status;Need for lift equipment PT Visit Diagnosis: Difficulty in walking, not elsewhere classified (R26.2);Muscle weakness (generalized) (M62.81)    Time: 8299-3716 PT Time Calculation (min) (ACUTE ONLY): 28 min   Charges:   PT Evaluation $PT Eval Moderate Complexity: 1 Mod         Philomena Doheny PT 04/01/2021  Acute Rehabilitation Services Pager [phone] Office [phone]

## 2021-04-01 NOTE — TOC Progression Note (Signed)
Transition of Care [facility]) - Progression Note    Patient Details  Name: NICKOLAUS BORDELON MRN: [id number] Date of Birth: Jul 01, 1946  Transition of Care [facility]) CM/SW Contact  Leeroy Cha, RN Phone Number: 04/01/2021, 7:53 AM  Clinical Narrative:     74-year with history of HTN, peripheral neuropathy came to the hospital complains of increasing lower extremity swelling, dyspnea on exertion and weight gain.  Patient was found to be in CHF exacerbation along with atrial flutter/atrial fibrillation with RVR.  Cardiology team was consulted.  Patient was started on Cardizem drip with as needed metoprolol.  Echocardiogram showed EF of 40%.  Due to combination of a flutter with RVR and CHF, underwent TEE cardioversion 5/16.  Still needs aggressive diuresis. IV amiodarone ongoing and iv lasix for heart control and diuresis. PLAN: to return home when stable Expected Discharge Plan: Home/Self Care Barriers to Discharge: Continued Medical Work up  Expected Discharge Plan and Services Expected Discharge Plan: Home/Self Care   Discharge Planning Services: CM Consult   Living arrangements for the past 2 months: Single Family Home                                       Social Determinants of Health (SDOH) Interventions    Readmission Risk Interventions No flowsheet data found.

## 2021-04-01 NOTE — Progress Notes (Signed)
Patient continues to refuses BD therapy via nebulizer. He c/o worsening sob with nebulizer use. Education provided on possible causes including education on anti-cholinergics such as atrovent. He further declines to use a nebulizer with only xopenex. However, he is not opposed to using an mdi, as he states he uses a "puffer" at home. Nebulizers discontinued and inhaler ordered at this time to further care to his pulmonary needs. RN aware.

## 2021-04-01 NOTE — Evaluation (Signed)
Occupational Therapy Evaluation Patient Details Name: Jose Fitzpatrick MRN: [id number] DOB: 07-07-1946 Today's Date: 04/01/2021    History of Present Illness 75-year came to the hospital complains of increasing lower extremity swelling, dyspnea on exertion and weight gain.  Patient was found to be in CHF exacerbation along with atrial flutter/atrial fibrillation with RVR. s/p cardioversion 03/18/2021.  Pt with history of HTN, peripheral neuropathy   Clinical Impression   Mr. Jose Fitzpatrick is a 75 year old man who presents with generalized weakness, decreased activity tolerance, impaired balance, and excessive edema in lower extremities. Patient reports typically independent at home with a cane and able to perform ADLs though lower body dressing becoming more difficulty. Patient requiring +2 assistance for transfer to side of bed but limited by complaints of light headedness that increased requiring patient to be returned to supine. Bps monitored without significant orthostatic drop 115/58 to 117/68. Patient on 3 L [place] and o2 sat dropped to approx 86% on RA after a couple minutes - though pleth signal poor. Patient's ADLs limited to bed level and requiring total assist for lower body dressing and toileting. Patient will benefit from skilled OT services while in hospital to improve deficits and learn compensatory strategies as needed in order to return to PLOF.  Patient reports he wants to go home and he will family assistance but therapist recommends short term rehab at this time.    Follow Up Recommendations  SNF    Equipment Recommendations   ([facility])    Recommendations for Other Services       Precautions / Restrictions Precautions Precautions: Fall Precaution Comments: 1 fall in past 6 months, rectal tube, monitor vitals, light headed with sitting Restrictions Weight Bearing Restrictions: No      Mobility Bed Mobility Overal bed mobility: Needs Assistance Bed Mobility: Supine to  Sit;Sit to Supine     Supine to sit: +2 for physical assistance;Max assist Sit to supine: Total assist;+2 for physical assistance   General bed mobility comments: pt able to assist with advancing BUEs to EOB and with raising trunk, using bedrails to pull up but needed max assist to complete these movements; increased time and effort for all mobility. Pt sat EOB ~6 minutes but became very lightheaded and felt he was going to pass out, so was assisted back to supine. BP supine 115/58, sitting 117/67. Total assist of 2 to scoot up in bed. SaO2 86% on room air, 92% on 3L O2.    Transfers                 General transfer comment: NT- pt dizzy in sitting (see flowsheets for BP, pt was not orthostatic)    Balance Overall balance assessment: History of Falls;Needs assistance Sitting-balance support: Feet supported;Single extremity supported Sitting balance-Leahy Scale: Fair                                     ADL either performed or assessed with clinical judgement   ADL Overall ADL's : Needs assistance/impaired Eating/Feeding: Independent   Grooming: Set up;Bed level   Upper Body Bathing: Set up;Bed level   Lower Body Bathing: Maximal assistance;Bed level   Upper Body Dressing : Minimal assistance;Bed level   Lower Body Dressing: Total assistance;Bed level     Toilet Transfer Details (indicate cue type and reason): unable Toileting- Clothing Manipulation and Hygiene: Total assistance  Vision Patient Visual Report: No change from baseline       Perception     Praxis      Pertinent Vitals/Pain Pain Assessment: No/denies pain     Hand Dominance Right   Extremity/Trunk Assessment Upper Extremity Assessment Upper Extremity Assessment: Generalized weakness (Grossly 4/5 throughout upper extremities. Normal coordination and sensation.)   Lower Extremity Assessment Lower Extremity Assessment: RLE deficits/detail;LLE  deficits/detail RLE Deficits / Details: B feet and ankles with severe edema which limits mobility, able to wiggle toes and feet, B knee ext -4/5 RLE Sensation: history of peripheral neuropathy LLE Deficits / Details: B feet and ankles with severe edema which limits mobility, able to wiggle toes and feet, B knee ext -4/5 LLE Sensation: history of peripheral neuropathy       Communication Communication Communication: No difficulties   Cognition Arousal/Alertness: Awake/alert Behavior During Therapy: WFL for tasks assessed/performed Overall Cognitive Status: Within Functional Limits for tasks assessed                                     General Comments       Exercises General Exercises - Lower Extremity Ankle Circles/Pumps: AROM;Both;10 reps;Supine Short Arc Quad: AROM;Both;5 reps;Supine Long Arc Quad: AROM;Both;5 reps;Seated   Shoulder Instructions      Home Living Family/patient expects to be discharged to:: Private residence Living Arrangements: Alone Available Help at Discharge: Family;Available 24 hours/day Type of Home: House Home Access: Stairs to enter [employer] of Steps: 1   Home Layout: One level     Bathroom Shower/Tub: Occupational psychologist: Standard     Home Equipment: Cane - single point          Prior Functioning/Environment Level of Independence: Independent with assistive device(s)        Comments: walks with SPC, drives, uses cane; dressing has been labored for ~1 month, family has gotten groceries for about a month, daughter and granddaughter live 1 mile away ("one of them could move in with me"). Reports LB dressing had become difficult        OT Problem List: Decreased strength;Decreased activity tolerance;Impaired balance (sitting and/or standing);Decreased knowledge of use of DME or AE;Cardiopulmonary status limiting activity;Obesity;Increased edema      OT Treatment/Interventions: Self-care/ADL  training;Therapeutic exercise;DME and/or AE instruction;Therapeutic activities;Patient/family education;Balance training    OT Goals(Current goals can be found in the care plan section) Acute Rehab OT Goals Patient Stated Goal: hopes to DC home with assistance from daughter and granddaughter OT Goal Formulation: With patient Time For Goal Achievement: 04/15/21 Potential to Achieve Goals: Good  OT Frequency: Min 2X/week   Barriers to D/C:            Co-evaluation PT/OT/SLP Co-Evaluation/Treatment: Yes Reason for Co-Treatment: For patient/therapist safety;To address functional/ADL transfers PT goals addressed during session: Mobility/safety with mobility;Balance;Strengthening/ROM OT goals addressed during session:  (evaluation)      AM-PAC OT "6 Clicks" Daily Activity     Outcome Measure Help from another person eating meals?: None Help from another person taking care of personal grooming?: A Little Help from another person toileting, which includes using toliet, bedpan, or urinal?: Total Help from another person bathing (including washing, rinsing, drying)?: A Lot Help from another person to put on and taking off regular upper body clothing?: A Little Help from another person to put on and taking off regular lower body clothing?: Total 6 Click  Score: 14   End of Session Nurse Communication:  (okay to see per RN)  Activity Tolerance: Patient tolerated treatment well Patient left: in bed;with call bell/phone within reach  OT Visit Diagnosis: History of falling (Z91.81);Muscle weakness (generalized) (M62.81)                Time: 9417-4081 OT Time Calculation (min): 28 min Charges:  OT General Charges $OT Visit: 1 Visit OT Evaluation $OT Eval Moderate Complexity: 1 Mod  Jashay Roddy, OTR/L [place]  Office [phone] Pager: [place] 04/01/2021, 12:29 PM

## 2021-04-01 NOTE — Progress Notes (Signed)
Progress Note  Patient Name: Jose Fitzpatrick Date of Encounter: 04/01/2021  [facility] Cardiologist: Kirk Ruths, MD   Subjective   A little more SOB this morning. No complaints of chest pain or palpitations. Having some wheezing but declining nebulizer as he states these always make him feel worse.   Inpatient Medications    Scheduled Meds: . apixaban  5 mg Oral BID  . Chlorhexidine Gluconate Cloth  6 each Topical Daily  . digoxin  0.125 mg Oral Daily  . folic acid  1 mg Oral Daily  . gabapentin  600 mg Oral QPM  . ipratropium  0.5 mg Nebulization TID  . levalbuterol  1.25 mg Nebulization TID  . mouth rinse  15 mL Mouth Rinse BID  . multivitamin with minerals  1 tablet Oral Daily  . sacubitril-valsartan  1 tablet Oral BID  . temazepam  15 mg Oral QHS  . testosterone cypionate  200 mg Intramuscular Q14 Days  . thiamine  100 mg Oral Daily   Or  . thiamine  100 mg Intravenous Daily  . vitamin B-12  1,000 mcg Oral Daily   Continuous Infusions: . amiodarone 30 mg/hr (04/01/21 0600)  . furosemide 160 mg (04/01/21 0729)   PRN Meds:    Vital Signs    Vitals:   04/01/21 0402 04/01/21 0600 04/01/21 0700 04/01/21 0720  BP:  128/69    Pulse:  (!) 110 (!) 138   Resp:  18 17   Temp:    98.1 F (36.7 C)  TempSrc:    Oral  SpO2:  97% 95%   Weight: (!) 182.2 kg     Height:        Intake/Output Summary (Last 24 hours) at 04/01/2021 0805 Last data filed at 04/01/2021 0600 Gross per 24 hour  Intake 1116.47 ml  Output 4360 ml  Net -3243.53 ml   Last 3 Weights 04/01/2021 03/31/2021 03/31/2021  Weight (lbs) 401 lb 10.9 oz 401 lb 10.9 oz 402 lb 12.5 oz  Weight (kg) 182.2 kg 182.2 kg 182.7 kg      Telemetry    Episodes of sinus rhythm with PACs and atrial fibrillation with RVR with rates as high as 180s briefly - Personally Reviewed  ECG    No new tracings - Personally Reviewed  Physical Exam   GEN: Obese gentleman sitting upright in bed in no acute distress.    Neck: No JVD Cardiac: IRIR, no murmurs, rubs, or gallops.  Respiratory: expiratory wheezing with crackles at lung bases. GI: Soft, obese nontender, non-distended  MS: Markedly edematous with chronic venous stasis skin changes; No deformity. Neuro:  Nonfocal  Psych: Normal affect   Labs    High Sensitivity Troponin:   Recent Labs  Lab 03/15/2021 2023 03/28/2021 2147  TROPONINIHS 12 12      Chemistry Recent Labs  Lab 03/27/21 2120 03/28/21 0223 04/05/2021 0254 03/31/21 0647 04/01/21 0247  NA 138   < > 134* 137 136  K 4.0   < > 4.4 5.0 4.8  CL 94*   < > 92* 95* 93*  CO2 36*   < > 35* 37* 36*  GLUCOSE 130*   < > 127* 113* 122*  BUN 19   < > 28* 39* 37*  CREATININE 0.95   < > 0.75 1.44* 1.22  CALCIUM 8.2*   < > 8.3* 8.7* 8.4*  PROT 6.7  --   --   --   --   ALBUMIN 3.3*  --   --   --   --  AST 52*  --   --   --   --   ALT 36  --   --   --   --   ALKPHOS 50  --   --   --   --   BILITOT 0.9  --   --   --   --   GFRNONAA >60   < > >60 51* >60  ANIONGAP 8   < > 7 5 7    < > = values in this interval not displayed.     Hematology Recent Labs  Lab 03/27/21 0305 03/28/21 0223 04/01/21 0247  WBC 9.6 9.5 8.2  RBC 4.63 4.66 4.86  HGB 15.3 15.4 16.2  HCT 48.6 48.8 50.6  MCV 105.0* 104.7* 104.1*  MCH 33.0 33.0 33.3  MCHC 31.5 31.6 32.0  RDW 13.7 13.5 13.4  PLT 144* 154 150    BNPNo results for input(s): BNP, PROBNP in the last 168 hours.   DDimer No results for input(s): DDIMER in the last 168 hours.   Radiology    ECHO TEE  Result Date: 03/18/2021    TRANSESOPHOGEAL ECHO REPORT   Patient Name:   Jose Fitzpatrick Date of Exam: 04/09/2021 Medical Rec #:  [id number]         Height:       75.0 in Accession #:    [id number]        Weight:       407.2 lb Date of Birth:  1946/04/30        BSA:          2.969 m Patient Age:    75 years          BP:           98/72 mmHg Patient Gender: M                 HR:           129 bpm. Exam Location:  Inpatient Procedure:  Transesophageal Echo, Color Doppler and Cardiac Doppler Indications:     atrial fibrillation  History:         Patient has prior history of Echocardiogram examinations, most                  recent 03/27/2021. CHF, Arrythmias:Atrial Fibrillation; Risk                  Factors:Hypertension.  Sonographer:     Johny Chess Referring Phys:  [id number] HERDE Y DUNN Diagnosing Phys: Gwyndolyn Kaufman MD PROCEDURE: After discussion of the risks and benefits of a TEE, an informed consent was obtained from the patient. The transesophogeal probe was passed without difficulty through the esophogus of the patient. Imaged were obtained with the patient in a left lateral decubitus position. Local oropharyngeal anesthetic was provided with Cetacaine. Sedation performed by different physician. Patients was under conscious sedation during this procedure. The patient developed no complications during the procedure. A direct current cardioversion was performed. IMPRESSIONS  1. Left ventricular ejection fraction, by estimation, is 25 to 30%. The left ventricle has severely decreased function.  2. Right ventricular systolic function is moderately reduced. The right ventricular size is normal.  3. Left atrial size was moderately dilated. No left atrial/left atrial appendage thrombus was detected.  4. Right atrial size was moderately dilated.  5. The mitral valve is normal in structure. Mild mitral valve regurgitation. No evidence of mitral stenosis.  6. The aortic valve is tricuspid. There is  mild calcification of the aortic valve. There is mild thickening of the aortic valve. Aortic valve regurgitation is not visualized. Mild aortic valve sclerosis is present, with no evidence of aortic valve stenosis. FINDINGS  Left Ventricle: Left ventricular ejection fraction, by estimation, is 25 to 30%. The left ventricle has severely decreased function. The left ventricular internal cavity size was normal in size. Right Ventricle: The right  ventricular size is normal. No increase in right ventricular wall thickness. Right ventricular systolic function is moderately reduced. Left Atrium: Left atrial size was moderately dilated. Spontaneous echo contrast was present. No left atrial/left atrial appendage thrombus was detected. Right Atrium: Right atrial size was moderately dilated. Pericardium: There is no evidence of pericardial effusion. Mitral Valve: The mitral valve is normal in structure. There is mild thickening of the mitral valve leaflet(s). There is mild calcification of the mitral valve leaflet(s). Mild mitral annular calcification. Mild mitral valve regurgitation. No evidence of  mitral valve stenosis. Tricuspid Valve: The tricuspid valve is normal in structure. Tricuspid valve regurgitation is mild. Aortic Valve: The aortic valve is tricuspid. There is mild calcification of the aortic valve. There is mild thickening of the aortic valve. Aortic valve regurgitation is not visualized. Mild aortic valve sclerosis is present, with no evidence of aortic valve stenosis. Pulmonic Valve: The pulmonic valve was normal in structure. Pulmonic valve regurgitation is not visualized. Aorta: The aortic root and ascending aorta are structurally normal, with no evidence of dilitation. IAS/Shunts: No atrial level shunt detected by color flow Doppler. Gwyndolyn Kaufman MD Electronically signed by Gwyndolyn Kaufman MD Signature Date/Time: 03/19/2021/5:45:30 PM    Final    [place] EKG SITE RITE  Result Date: 03/31/2021 If Site Rite image not attached, placement could not be confirmed due to current cardiac rhythm.   Cardiac Studies   Echocardiogram 03/27/21: 1. Left ventricular ejection fraction, by estimation, is 35 to 40%. The left ventricle has moderately decreased function. Left ventricular endocardial border not optimally defined to evaluate regional wall motion. Left ventricular diastolic parameters are indeterminate. 2. Right ventricular systolic  function is mildly reduced. The right ventricular size is normal. 3. Left atrial size was mildly dilated. 4. Right atrial size was mildly dilated. 5. The mitral valve is normal in structure. Mild mitral valve regurgitation. No evidence of mitral stenosis. 6. The aortic valve was not well visualized. Aortic valve regurgitation is not visualized. No aortic stenosis is present. 7. The inferior vena cava is dilated in size with <50% respiratory variability, suggesting right atrial pressure of 15 mmHg. 8. Technically difficult study with poor acoustic windows.  TEE/DCCV: 03/29/2021 1. Left ventricular ejection fraction, by estimation, is 25 to 30%. The  left ventricle has severely decreased function.  2. Right ventricular systolic function is moderately reduced. The right  ventricular size is normal.  3. Left atrial size was moderately dilated. No left atrial/left atrial  appendage thrombus was detected.  4. Right atrial size was moderately dilated.  5. The mitral valve is normal in structure. Mild mitral valve  regurgitation. No evidence of mitral stenosis.  6. The aortic valve is tricuspid. There is mild calcification of the  aortic valve. There is mild thickening of the aortic valve. Aortic valve  regurgitation is not visualized. Mild aortic valve sclerosis is present,  with no evidence of aortic valve  stenosis.   Patient Profile     Jose Fitzpatrick is a 76 y.o. male with hypertension, morbid obesity, chronic venous insufficiency, possible alcohol abuse who was admitted  on 04/09/2021 with shortness of breath and acute decompensated systolic heart failure.  Found to be in atrial flutter with RVR.  Assessment & Plan    1. Acute combined CHF: patient presented with SOB and weight gain. Found to be in atrial flutter with RVR on admission. Echo obtained revealing EF 35-40%, mildly reduced RV systolic function, mild biatrial enlargement, and mild MR. 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 He underwent TEE/DCCV 03/21/2021 which showed further decline in EF to 25-30% followed by successful conversion to sinus rhythm, however had recurrent arrhythmia of atrial fib shortly thereafter. He has been diuresing with aggressive IV lasix 160mg  BID with UOP net -3.2L in the past 24 hours and -15.8L this admission. Weight is stagnant at 401lbs today if accurate, down from peak of 407lbs this admission. Cr improved to 1.22 today from 1.44 yesterday, though remains up from baseline 1.0.  He was started on entresto for GDMT, as well as digoxin. BBlocker on hold in the setting of acute decompensated CHF.  Unna boots were ordered 03/31/21.  - Continue aggressive IV diuresis  - Continue entresto - Continue digoxin - Anticipate starting BBlocker prior to discharge  - Favor repeating an echo in 2-3 months to re-evaluate LV function. If EF remains reduced despite GDMT and management of his atrial flutter, may need to consider ischemic evaluation at that time.   2. New onset atrial flutter/fib with RVR: patient found to be in atrial flutter with rates up to 160s on arrival. He underwent TEE/DCCV 04/04/2021, initially with conversion to sinus rhythm, then went into atrial fibrillation. Rate control strategies have been limited 2/2 EF 25-30%. He was started on amiodarone 400mg  BID x7 days, with plans to continue 200mg  daily going forwards. He was also started on digoxin as BBlockers are on hold in the setting of acute decompensated CHF. Allowing for some permissive tachycardia as we continue diuresis as above. In afib with RVR on my eval with rates in the 160s - Continue eliquis for stroke ppx - Continue amiodarone and digoxin for rate/rhythm control - gave additional amiodarone bolus this AM.  - Could consider repeat DCCV as volume status improves with diuresis and amio load continues.    3. AKI: Cr peaked at 1.44 after starting entreso and spironolactone.  Spironolactone held yesterday and Cr improved to 1.22, though remains up from baseline 1.0.  - Continue to monitor closely with ongoing diuresis and titration of CHF medications  4. HTN: BP overall stable - Managed in the context of #1.       For questions or updates, please contact [place] Please consult [email or web address] for contact info under        Signed, Abigail Butts, PA-C  04/01/2021, 8:05 AM

## 2021-04-01 NOTE — Progress Notes (Signed)
Orders reviewed and unna boots have been ordered for patient's venous stasis. Per charge nurse personnel that places unna boots are not available on night shift. Wound care consult ordered. Day shift to address order.   Edema +4 BLEs with swelling up to thighs and scrotum. Scrotum +3 edema.  BLE hot to touch with cracked pink skin. Dopplered LE pedal pulses.   Abdomen soft but distended. Rounded. Nontender. Patient endorses no abdominal symptoms. Patient states last BM was 03/31/21.   Patient Alert and Oriented.   Will continue to assess.

## 2021-04-01 NOTE — Progress Notes (Signed)
Patient continues to refuse nocturnal CPAP. RN aware. Order changed to prn.

## 2021-04-01 NOTE — Progress Notes (Addendum)
Peripherally Inserted Central Catheter Placement  The IV Nurse has discussed with the patient and/or persons authorized to consent for the patient, the purpose of this procedure and the potential benefits and risks involved with this procedure.  The benefits include less needle sticks, lab draws from the catheter, and the patient may be discharged home with the catheter. Risks include, but not limited to, infection, bleeding, blood clot (thrombus formation), and puncture of an artery; nerve damage and irregular heartbeat and possibility to perform a PICC exchange if needed/ordered by physician.  Alternatives to this procedure were also discussed.  Bard Power PICC patient education guide, fact sheet on infection prevention and patient information card has been provided to patient /or left at bedside.    PICC Placement Documentation  PICC Triple Lumen 62/13/08 Right Basilic 45 cm 1 cm (Active)  Indication for Insertion or Continuance of Line Vasoactive infusions 04/01/21 1224  Exposed Catheter (cm) 1 cm 04/01/21 1224  Site Assessment Clean;Dry;Intact 04/01/21 1224  Line Status Flushed;Saline locked;Blood return noted 04/01/21 1224  Dressing Type Transparent;Securing device 04/01/21 1224  Dressing Status Clean;Dry;Intact 04/01/21 1224  Antimicrobial disc in place? Yes 04/01/21 1224  Dressing Intervention New dressing 04/01/21 6578  Dressing Change Due 04/08/21 04/01/21 [place], [place] 04/01/2021, 12:25 PM

## 2021-04-01 NOTE — Progress Notes (Signed)
PROGRESS NOTE    Jose Fitzpatrick  WYO:[id number] DOB: Oct 22, 1946 DOA: 03/16/2021 PCP: Tamsen Roers, MD   Brief Narrative: 75 year old morbidly obese male with a BMI of 50, hypertension, peripheral neuropathy admitted with rapid A. fib/flutter/CHF exacerbation.  He underwent TEE cardioversion 03/29/2021.  Assessment & Plan:   Principal Problem:   Atrial fibrillation with RVR (HCC) Active Problems:   HTN (hypertension)   Acute CHF (congestive heart failure) (HCC)   Acute respiratory failure with hypoxia (HCC)   Elevated TSH   BMI 50.0-59.9, adult ([place])  #1 new onset acute systolic heart failure-echocardiogram with ejection fraction 25%. He continues with volume overloaded with bilateral pitting edema all all the way up to the upper thighs. He is on digoxin 0.125 mg daily  On Entresto  Lasix 160 mg twice a day.   Cardiology ordered PICC line for Lasix drip. Avoiding beta-blockers and calcium channel blockers with low EF/acute CHF exacerbation  #2 A. fib RVR/a flutter status post cardioversion 04/03/2021 on digoxin and amiodarone.  #3 AKI due to diuresis creatinine 1.22 down from 1.44 yesterday.  Potassium 4.8.  #4 morbid obesity-  #5 essential hypertension continue above medications.  #6 history of alcohol abuse continue supportive treatment.  #7 macrocytosis likely from alcohol abuse.   Estimated body mass index is 50.21 kg/m as calculated from the following:   Height as of this encounter: 6\' 3"  (1.905 m).   Weight as of this encounter: 182.2 kg.  DVT prophylaxis: Eliquis  code Status: Full code Family Communication: None at bedside Disposition Plan:  Status is: Inpatient  Dispo: The patient is from: Home              Anticipated d/c is to: Home              Patient currently is not medically stable to d/c.   Difficult to place patient No   Consultants: Cardiology  Procedures: DC cardioversion 04/02/2021 Antimicrobials None Subjective Patient is on room  air! He is resting in bed Therapy trying to work with him Continues to feel short of breath with dyspnea on exertion Overnight went into A. fib again  Objective: Vitals:   04/01/21 0900 04/01/21 0909 04/01/21 1000 04/01/21 1122  BP: 116/66  (!) 114/35   Pulse: (!) 131 (!) 139 90   Resp: 14     Temp:      TempSrc:      SpO2: 94%  98% (!) 86%  Weight:      Height:        Intake/Output Summary (Last 24 hours) at 04/01/2021 1131 Last data filed at 04/01/2021 0910 Gross per 24 hour  Intake 929.26 ml  Output 4360 ml  Net -3430.74 ml   Filed Weights   03/31/21 0500 03/31/21 0800 04/01/21 0402  Weight: (!) 182.7 kg (!) 182.2 kg (!) 182.2 kg    Examination:  General exam: Appears calm and comfortable  Respiratory system crackles bilaterally to auscultation. Respiratory effort normal. Cardiovascular system: S1 & S2 heard, RRR. No JVD, murmurs, rubs, gallops or clicks. No pedal edema. Gastrointestinal system: Abdomen is nondistended, soft and nontender. No organomegaly or masses felt. Normal bowel sounds heard. Central nervous system: Alert and oriented. No focal neurological deficits. Extremities: 3+ hard pitting edema up to upper thighs. Skin: No rashes, lesions or ulcers Psychiatry: Judgement and insight appear normal. Mood & affect appropriate.     Data Reviewed: I have personally reviewed following labs and imaging studies  CBC: Recent Labs  Lab  03/26/21 0251 03/27/21 0305 03/28/21 0223 04/01/21 0247  WBC 9.1 9.6 9.5 8.2  HGB 15.2 15.3 15.4 16.2  HCT 47.6 48.6 48.8 50.6  MCV 104.6* 105.0* 104.7* 104.1*  PLT 144* 144* 154 416   Basic Metabolic Panel: Recent Labs  Lab 03/26/21 0251 03/27/21 0305 03/27/21 2120 03/28/21 0223 03/29/21 0339 04/13/2021 0254 03/31/21 0647 04/01/21 0247  NA 143 140   < > 138 139 134* 137 136  K 3.2* 3.7   < > 3.9 4.3 4.4 5.0 4.8  CL 99 96*   < > 93* 94* 92* 95* 93*  CO2 36* 37*   < > 38* 39* 35* 37* 36*  GLUCOSE 113* 118*   < >  119* 111* 127* 113* 122*  BUN 16 16   < > 18 24* 28* 39* 37*  CREATININE 1.08 1.05   < > 1.01 1.05 0.75 1.44* 1.22  CALCIUM 8.4* 8.1*   < > 8.3* 8.4* 8.3* 8.7* 8.4*  MG 1.9 1.7  --  2.1  --   --   --  2.3   < > = values in this interval not displayed.   GFR: Estimated Creatinine Clearance: 91.5 mL/min (by C-G formula based on SCr of 1.22 mg/dL). Liver Function Tests: Recent Labs  Lab 03/27/21 2120  AST 52*  ALT 36  ALKPHOS 50  BILITOT 0.9  PROT 6.7  ALBUMIN 3.3*   No results for input(s): LIPASE, AMYLASE in the last 168 hours. No results for input(s): AMMONIA in the last 168 hours. Coagulation Profile: No results for input(s): INR, PROTIME in the last 168 hours. Cardiac Enzymes: No results for input(s): CKTOTAL, CKMB, CKMBINDEX, TROPONINI in the last 168 hours. BNP (last 3 results) No results for input(s): PROBNP in the last 8760 hours. HbA1C: No results for input(s): HGBA1C in the last 72 hours. CBG: No results for input(s): GLUCAP in the last 168 hours. Lipid Profile: No results for input(s): CHOL, HDL, LDLCALC, TRIG, CHOLHDL, LDLDIRECT in the last 72 hours. Thyroid Function Tests: No results for input(s): TSH, T4TOTAL, FREET4, T3FREE, THYROIDAB in the last 72 hours. Anemia Panel: No results for input(s): VITAMINB12, FOLATE, FERRITIN, TIBC, IRON, RETICCTPCT in the last 72 hours. Sepsis Labs: Recent Labs  Lab 04/11/2021 1756 03/29/2021 2040  LATICACIDVEN 2.1* 1.6    Recent Results (from the past 240 hour(s))  Resp Panel by RT-PCR (Flu A&B, Covid) Nasopharyngeal Swab     Status: None   Collection Time: 03/16/2021  9:24 PM   Specimen: Nasopharyngeal Swab; Nasopharyngeal(NP) swabs in vial transport medium  Result Value Ref Range Status   SARS Coronavirus 2 by RT PCR NEGATIVE NEGATIVE Final    Comment: (NOTE) SARS-CoV-2 target nucleic acids are NOT DETECTED.  The SARS-CoV-2 RNA is generally detectable in upper respiratory specimens during the acute phase of infection.  The lowest concentration of SARS-CoV-2 viral copies this assay can detect is 138 copies/mL. A negative result does not preclude SARS-Cov-2 infection and should not be used as the sole basis for treatment or other patient management decisions. A negative result may occur with  improper specimen collection/handling, submission of specimen other than nasopharyngeal swab, presence of viral mutation(s) within the areas targeted by this assay, and inadequate number of viral copies(<138 copies/mL). A negative result must be combined with clinical observations, patient history, and epidemiological information. The expected result is Negative.  Fact Sheet for Patients:  EntrepreneurPulse.com.au  Fact Sheet for Healthcare Providers:  IncredibleEmployment.be  This test is no t yet approved  or cleared by the [place] and  has been authorized for detection and/or diagnosis of SARS-CoV-2 by FDA under an Emergency Use Authorization (EUA). This EUA will remain  in effect (meaning this test can be used) for the duration of the COVID-19 declaration under Section 564(b)(1) of the Act, 21 U.S.C.section 360bbb-3(b)(1), unless the authorization is terminated  or revoked sooner.       Influenza A by PCR NEGATIVE NEGATIVE Final   Influenza B by PCR NEGATIVE NEGATIVE Final    Comment: (NOTE) The Xpert Xpress SARS-CoV-2/FLU/RSV plus assay is intended as an aid in the diagnosis of influenza from Nasopharyngeal swab specimens and should not be used as a sole basis for treatment. Nasal washings and aspirates are unacceptable for Xpert Xpress SARS-CoV-2/FLU/RSV testing.  Fact Sheet for Patients: EntrepreneurPulse.com.au  Fact Sheet for Healthcare Providers: IncredibleEmployment.be  This test is not yet approved or cleared by the [place] FDA and has been authorized for detection and/or diagnosis of SARS-CoV-2 by FDA  under an Emergency Use Authorization (EUA). This EUA will remain in effect (meaning this test can be used) for the duration of the COVID-19 declaration under Section 564(b)(1) of the Act, 21 U.S.C. section 360bbb-3(b)(1), unless the authorization is terminated or revoked.  Performed at [facility], [place] [address], [place], [place] [address]   MRSA PCR Screening     Status: None   Collection Time: 03/25/21  8:45 AM   Specimen: Nasal Mucosa; Nasopharyngeal  Result Value Ref Range Status   MRSA by PCR NEGATIVE NEGATIVE Final    Comment:        The GeneXpert MRSA Assay (FDA approved for NASAL specimens only), is one component of a comprehensive MRSA colonization surveillance program. It is not intended to diagnose MRSA infection nor to guide or monitor treatment for MRSA infections. Performed at [facility], [place] [address] [place], [place] [address]          Radiology Studies: ECHO TEE  Result Date: 03/21/2021    TRANSESOPHOGEAL ECHO REPORT   Patient Name:   Myron A Hendricks Date of Exam: 03/27/2021 Medical Rec #:  [id number]         Height:       75.0 in Accession #:    [id number]        Weight:       407.2 lb Date of Birth:  01-Feb-1946        BSA:          2.969 m Patient Age:    9 years          BP:           98/72 mmHg Patient Gender: M                 HR:           129 bpm. Exam Location:  Inpatient Procedure: Transesophageal Echo, Color Doppler and Cardiac Doppler Indications:     atrial fibrillation  History:         Patient has prior history of Echocardiogram examinations, most                  recent 03/27/2021. CHF, Arrythmias:Atrial Fibrillation; Risk                  Factors:Hypertension.  Sonographer:     Johny Chess Referring Phys:  [id number] DUNN Diagnosing Phys: Gwyndolyn Kaufman MD PROCEDURE: After discussion of the risks and benefits  of a TEE, an informed consent was obtained from the patient. The transesophogeal probe  was passed without difficulty through the esophogus of the patient. Imaged were obtained with the patient in a left lateral decubitus position. Local oropharyngeal anesthetic was provided with Cetacaine. Sedation performed by different physician. Patients was under conscious sedation during this procedure. The patient developed no complications during the procedure. A direct current cardioversion was performed. IMPRESSIONS  1. Left ventricular ejection fraction, by estimation, is 25 to 30%. The left ventricle has severely decreased function.  2. Right ventricular systolic function is moderately reduced. The right ventricular size is normal.  3. Left atrial size was moderately dilated. No left atrial/left atrial appendage thrombus was detected.  4. Right atrial size was moderately dilated.  5. The mitral valve is normal in structure. Mild mitral valve regurgitation. No evidence of mitral stenosis.  6. The aortic valve is tricuspid. There is mild calcification of the aortic valve. There is mild thickening of the aortic valve. Aortic valve regurgitation is not visualized. Mild aortic valve sclerosis is present, with no evidence of aortic valve stenosis. FINDINGS  Left Ventricle: Left ventricular ejection fraction, by estimation, is 25 to 30%. The left ventricle has severely decreased function. The left ventricular internal cavity size was normal in size. Right Ventricle: The right ventricular size is normal. No increase in right ventricular wall thickness. Right ventricular systolic function is moderately reduced. Left Atrium: Left atrial size was moderately dilated. Spontaneous echo contrast was present. No left atrial/left atrial appendage thrombus was detected. Right Atrium: Right atrial size was moderately dilated. Pericardium: There is no evidence of pericardial effusion. Mitral Valve: The mitral valve is normal in structure. There is mild thickening of the mitral valve leaflet(s). There is mild calcification of  the mitral valve leaflet(s). Mild mitral annular calcification. Mild mitral valve regurgitation. No evidence of  mitral valve stenosis. Tricuspid Valve: The tricuspid valve is normal in structure. Tricuspid valve regurgitation is mild. Aortic Valve: The aortic valve is tricuspid. There is mild calcification of the aortic valve. There is mild thickening of the aortic valve. Aortic valve regurgitation is not visualized. Mild aortic valve sclerosis is present, with no evidence of aortic valve stenosis. Pulmonic Valve: The pulmonic valve was normal in structure. Pulmonic valve regurgitation is not visualized. Aorta: The aortic root and ascending aorta are structurally normal, with no evidence of dilitation. IAS/Shunts: No atrial level shunt detected by color flow Doppler. Gwyndolyn Kaufman MD Electronically signed by Gwyndolyn Kaufman MD Signature Date/Time: 2021-04-23/5:45:30 PM    Final    [place] EKG SITE RITE  Result Date: 03/31/2021 If Site Rite image not attached, placement could not be confirmed due to current cardiac rhythm.       Scheduled Meds: . apixaban  5 mg Oral BID  . Chlorhexidine Gluconate Cloth  6 each Topical Daily  . digoxin  0.125 mg Oral Daily  . folic acid  1 mg Oral Daily  . gabapentin  600 mg Oral QPM  . ipratropium  0.5 mg Nebulization TID  . levalbuterol  1.25 mg Nebulization TID  . mouth rinse  15 mL Mouth Rinse BID  . multivitamin with minerals  1 tablet Oral Daily  . sacubitril-valsartan  1 tablet Oral BID  . temazepam  15 mg Oral QHS  . testosterone cypionate  200 mg Intramuscular Q14 Days  . thiamine  100 mg Oral Daily   Or  . thiamine  100 mg Intravenous Daily  . vitamin B-12  1,000  mcg Oral Daily   Continuous Infusions: . amiodarone 30 mg/hr (04/01/21 0931)  . furosemide Stopped (04/01/21 0829)     LOS: 8 days   Georgette Shell, MD  04/01/2021, 11:31 AM

## 2021-04-02 ENCOUNTER — Ambulatory Visit: Payer: Self-pay | Admitting: Cardiology

## 2021-04-02 DIAGNOSIS — I4891 Unspecified atrial fibrillation: Secondary | ICD-10-CM | POA: Diagnosis not present

## 2021-04-02 LAB — CBC
HCT: 49.4 % (ref 39.0–52.0)
Hemoglobin: 15.9 g/dL (ref 13.0–17.0)
MCH: 33.1 pg (ref 26.0–34.0)
MCHC: 32.2 g/dL (ref 30.0–36.0)
MCV: 102.9 fL — ABNORMAL HIGH (ref 80.0–100.0)
Platelets: 156 10*3/uL (ref 150–400)
RBC: 4.8 MIL/uL (ref 4.22–5.81)
RDW: 13.2 % (ref 11.5–15.5)
WBC: 7.8 10*3/uL (ref 4.0–10.5)
nRBC: 0 % (ref 0.0–0.2)

## 2021-04-02 LAB — BASIC METABOLIC PANEL
Anion gap: 10 (ref 5–15)
BUN: 24 mg/dL — ABNORMAL HIGH (ref 8–23)
CO2: 36 mmol/L — ABNORMAL HIGH (ref 22–32)
Calcium: 8.3 mg/dL — ABNORMAL LOW (ref 8.9–10.3)
Chloride: 90 mmol/L — ABNORMAL LOW (ref 98–111)
Creatinine, Ser: 0.92 mg/dL (ref 0.61–1.24)
GFR, Estimated: >60 mL/min (ref >60)
Glucose, Bld: 109 mg/dL — ABNORMAL HIGH (ref 70–99)
Potassium: 4.2 mmol/L (ref 3.5–5.1)
Sodium: 136 mmol/L (ref 135–145)

## 2021-04-02 LAB — MAGNESIUM: Magnesium: 2.1 mg/dL (ref 1.7–2.4)

## 2021-04-02 MED ORDER — SODIUM CHLORIDE 0.9 % IV SOLN
INTRAVENOUS | Status: DC | PRN
  Administered 2021-04-02: 250 mL via INTRAVENOUS
  Administered 2021-04-10: 500 mL via INTRAVENOUS

## 2021-04-02 MED ORDER — FUROSEMIDE 10 MG/ML IJ SOLN
20.0000 mg/h | INTRAVENOUS | Status: DC
  Administered 2021-04-02 (×2): 15 mg/h via INTRAVENOUS
  Administered 2021-04-03 – 2021-04-04 (×3): 20 mg/h via INTRAVENOUS
  Filled 2021-04-02 (×6): qty 20

## 2021-04-02 MED ORDER — MAGNESIUM SULFATE 50 % IJ SOLN: 2.0000 g | Freq: Once | INTRAMUSCULAR | Status: DC

## 2021-04-02 MED ORDER — SPIRONOLACTONE 25 MG PO TABS
25.0000 mg | ORAL_TABLET | Freq: Every day | ORAL | Status: DC
  Administered 2021-04-02 – 2021-04-03 (×2): 25 mg via ORAL
  Filled 2021-04-02 (×3): qty 1

## 2021-04-02 MED ORDER — CARVEDILOL 12.5 MG PO TABS
12.5000 mg | ORAL_TABLET | Freq: Two times a day (BID) | ORAL | Status: DC
  Administered 2021-04-02: 12.5 mg via ORAL
  Filled 2021-04-02: qty 1

## 2021-04-02 MED ORDER — MAGNESIUM SULFATE 2 GM/50ML IV SOLN
2.0000 g | Freq: Once | INTRAVENOUS | Status: AC
  Administered 2021-04-02: 2 g via INTRAVENOUS
  Filled 2021-04-02: qty 50

## 2021-04-02 MED ORDER — AMIODARONE IV BOLUS ONLY 150 MG/100ML
150.0000 mg | Freq: Once | INTRAVENOUS | Status: AC
  Administered 2021-04-02: 150 mg via INTRAVENOUS
  Filled 2021-04-02: qty 100

## 2021-04-02 MED ORDER — POTASSIUM CHLORIDE CRYS ER 20 MEQ PO TBCR
40.0000 meq | EXTENDED_RELEASE_TABLET | Freq: Every day | ORAL | Status: DC
  Administered 2021-04-02 – 2021-04-03 (×2): 40 meq via ORAL
  Filled 2021-04-02 (×2): qty 2

## 2021-04-02 NOTE — Progress Notes (Signed)
Patient back into A. Fib RVR (confirmed with EKG), attending MD and cardiologist made aware. Continue current treatment plan. Patient asymptomatic, denies chest pain, BP 157/72.

## 2021-04-02 NOTE — Progress Notes (Signed)
Cardiology Progress Note  Patient ID: Jose Fitzpatrick MRN: [id number] DOB: 1946/09/06 Date of Encounter: 04/02/2021  Primary Cardiologist: Kirk Ruths, MD  Subjective   Chief Complaint: Shortness of breath  HPI: Net -4.6 L.  Creatinine is stable.  Coox normal.  Still having intermittent A. fib.  Overall maintaining sinus rhythm.  Still a long way to go on diuresis.  ROS:  All other ROS reviewed and negative. Pertinent positives noted in the HPI.     Inpatient Medications  Scheduled Meds: . apixaban  5 mg Oral BID  . carvedilol  12.5 mg Oral BID WC  . Chlorhexidine Gluconate Cloth  6 each Topical Daily  . folic acid  1 mg Oral Daily  . gabapentin  600 mg Oral QPM  . levalbuterol  2 puff Inhalation TID  . mouth rinse  15 mL Mouth Rinse BID  . multivitamin with minerals  1 tablet Oral Daily  . sacubitril-valsartan  1 tablet Oral BID  . sodium chloride flush  10-40 mL Intracatheter Q12H  . testosterone cypionate  200 mg Intramuscular Q14 Days  . thiamine  100 mg Oral Daily   Or  . thiamine  100 mg Intravenous Daily  . vitamin B-12  1,000 mcg Oral Daily   Continuous Infusions: . sodium chloride 250 mL (04/02/21 0816)  . amiodarone 30 mg/hr (04/02/21 0800)  . furosemide (LASIX) 200 mg in dextrose 5% 100 mL (2mg /mL) infusion     PRN Meds: sodium chloride, levalbuterol, sodium chloride flush   Vital Signs   Vitals:   04/02/21 0700 04/02/21 0800 04/02/21 0850 04/02/21 0900  BP: (!) 162/66 (!) 178/80  (!) 173/71  Pulse: 91 93  94  Resp: 13 15  16   Temp:  98.2 F (36.8 C)    TempSrc:  Oral    SpO2: 99% 97% 95% 95%  Weight:      Height:        Intake/Output Summary (Last 24 hours) at 04/02/2021 1016 Last data filed at 04/02/2021 0800 Gross per 24 hour  Intake 1669.2 ml  Output 6375 ml  Net -4705.8 ml   Last 3 Weights 04/02/2021 04/01/2021 03/31/2021  Weight (lbs) 399 lb 14.6 oz 401 lb 10.9 oz 401 lb 10.9 oz  Weight (kg) 181.4 kg 182.2 kg 182.2 kg       Telemetry  Overnight telemetry shows sinus rhythm with intermittent A. fib, which I personally reviewed.   ECG  The most recent ECG shows atrial fibrillation heart rate 140, which I personally reviewed.   Physical Exam   Vitals:   04/02/21 0700 04/02/21 0800 04/02/21 0850 04/02/21 0900  BP: (!) 162/66 (!) 178/80  (!) 173/71  Pulse: 91 93  94  Resp: 13 15  16   Temp:  98.2 F (36.8 C)    TempSrc:  Oral    SpO2: 99% 97% 95% 95%  Weight:      Height:         Intake/Output Summary (Last 24 hours) at 04/02/2021 1016 Last data filed at 04/02/2021 0800 Gross per 24 hour  Intake 1669.2 ml  Output 6375 ml  Net -4705.8 ml    Last 3 Weights 04/02/2021 04/01/2021 03/31/2021  Weight (lbs) 399 lb 14.6 oz 401 lb 10.9 oz 401 lb 10.9 oz  Weight (kg) 181.4 kg 182.2 kg 182.2 kg    Body mass index is 49.99 kg/m.  General: Morbidly obese Head: Atraumatic, normal size  Eyes: PEERLA, EOMI  Neck: Supple, difficult JVD assessment Endocrine: No  thryomegaly Cardiac: Normal S1, S2; RRR; no murmurs, rubs, or gallops Lungs: Rales bilaterally Abd: Soft, nontender, no hepatomegaly  Ext: 2+ pitting edema up to the thighs Musculoskeletal: No deformities, BUE and BLE strength normal and equal Skin: Warm and dry, no rashes   Neuro: Alert and oriented to person, place, time, and situation, CNII-XII grossly intact, no focal deficits  Psych: Normal mood and affect   Labs  High Sensitivity Troponin:   Recent Labs  Lab 03/16/2021 2023 03/21/2021 2147  TROPONINIHS 12 12     Cardiac EnzymesNo results for input(s): TROPONINI in the last 168 hours. No results for input(s): TROPIPOC in the last 168 hours.  Chemistry Recent Labs  Lab 03/27/21 2120 03/28/21 0223 03/31/21 0647 04/01/21 0247 04/02/21 0624  NA 138   < > 137 136 136  K 4.0   < > 5.0 4.8 4.2  CL 94*   < > 95* 93* 90*  CO2 36*   < > 37* 36* 36*  GLUCOSE 130*   < > 113* 122* 109*  BUN 19   < > 39* 37* 24*  CREATININE 0.95   < > 1.44* 1.22  0.92  CALCIUM 8.2*   < > 8.7* 8.4* 8.3*  PROT 6.7  --   --   --   --   ALBUMIN 3.3*  --   --   --   --   AST 52*  --   --   --   --   ALT 36  --   --   --   --   ALKPHOS 50  --   --   --   --   BILITOT 0.9  --   --   --   --   GFRNONAA >60   < > 51* >60 >60  ANIONGAP 8   < > 5 7 10    < > = values in this interval not displayed.    Hematology Recent Labs  Lab 03/28/21 0223 04/01/21 0247 04/02/21 0624  WBC 9.5 8.2 7.8  RBC 4.66 4.86 4.80  HGB 15.4 16.2 15.9  HCT 48.8 50.6 49.4  MCV 104.7* 104.1* 102.9*  MCH 33.0 33.3 33.1  MCHC 31.6 32.0 32.2  RDW 13.5 13.4 13.2  PLT 154 150 156   BNPNo results for input(s): BNP, PROBNP in the last 168 hours.  DDimer No results for input(s): DDIMER in the last 168 hours.   Radiology  [place] EKG SITE RITE  Result Date: 03/31/2021 If Site Rite image not attached, placement could not be confirmed due to current cardiac rhythm.   Cardiac Studies  TTE 03/23/2021 1. Left ventricular ejection fraction, by estimation, is 25 to 30%. The  left ventricle has severely decreased function.  2. Right ventricular systolic function is moderately reduced. The right  ventricular size is normal.  3. Left atrial size was moderately dilated. No left atrial/left atrial  appendage thrombus was detected.  4. Right atrial size was moderately dilated.  5. The mitral valve is normal in structure. Mild mitral valve  regurgitation. No evidence of mitral stenosis.  6. The aortic valve is tricuspid. There is mild calcification of the  aortic valve. There is mild thickening of the aortic valve. Aortic valve  regurgitation is not visualized. Mild aortic valve sclerosis is present,  with no evidence of aortic valve  stenosis.   Patient Profile  Jose Kasler Carpenteris a 75 y.o.malewith hypertension, morbid obesity, chronic venous insufficiency, possible alcohol abuse who was admitted on  04/05/2021 with shortness of breath and acute decompensated systolic heart  failure. Found to be in atrial flutter with RVR.   Assessment & Plan   1.  New onset systolic heart failure, EF 25-30% -Coox shows he is not in shock.  BPs are stable.   -We will stop digoxin.  Start Coreg 12.5 twice daily. -He is on Entresto 24-26 mg twice daily. -Potassium now acceptable.  Start Aldactone 25 mg daily. -I favor transitioning to a Lasix drip.  We will start him at 15 mg an hour.  He has been receiving 160 mg IV Lasix twice daily.  This will allow for better titration of diuretics.  Also will be easier on his blood pressure. -Net -4.6 L overnight.  Net -20.4 L since admission.  He still has at least 40 to 50 pounds of fluids on my exam.  He is grossly volume overloaded with pitting edema up to his thighs. -Suspect he will require several days more of aggressive diuresis. -Close monitoring of renal function.  Aggressive repletion of potassium and magnesium while on Lasix drip. -I suspect the etiology of his heart failure is arrhythmia related.  He arrived in atrial flutter.  He is had atrial fibrillation here. -Ultimately will need to be considered for ablation.  Suspect his weight will preclude this.  2.  New onset atrial flutter now with atrial fibrillation -Status post cardioversion on Apr 05, 2021 for atrial flutter.  Normal sinus rhythm with short-lived.  Developed A. fib on 03/31/2021. -We have elected for amiodarone for rhythm control.  He is in and out of A. fib.  We will continue amiodarone drip for now. -I suspect volume is driving his A. fib.  Hopefully we can transition to p.o. amnio once his volume status improves.  For questions or updates, please contact [place] Please consult [email or web address] for contact info under   Time Spent with Patient: I have spent a total of 35 minutes with patient reviewing hospital notes, telemetry, EKGs, labs and examining the patient as well as establishing an assessment and plan that was discussed with the patient.  > 50% of time  was spent in direct patient care.    Signed, Addison Naegeli. Audie Box, MD, [place]  04/02/2021 10:16 AM

## 2021-04-02 NOTE — Progress Notes (Signed)
PROGRESS NOTE    Jose Fitzpatrick  CHY:[id number] DOB: February 06, 1946 DOA: 04/12/2021 PCP: Tamsen Roers, MD   Brief Narrative: 75 year old morbidly obese male with a BMI of 50, hypertension, peripheral neuropathy admitted with rapid A. fib/flutter/CHF exacerbation.  He underwent TEE cardioversion 04/04/2021.  Assessment & Plan:   Principal Problem:   Atrial fibrillation with RVR (HCC) Active Problems:   HTN (hypertension)   Acute CHF (congestive heart failure) (HCC)   Acute respiratory failure with hypoxia (HCC)   Elevated TSH   BMI 50.0-59.9, adult ([place])  #1 new onset acute systolic heart failure-echocardiogram with ejection fraction 25%. He continues with volume overloaded with bilateral pitting edema all all the way up to the upper thighs. Reviewed cardiology notes.  Planning on starting Lasix drip today stopping digoxin and starting Coreg 12.5 twice daily with Aldactone 25 mg daily and to continue Entresto. Follow daily renal function panel and magnesium.  #2 A. fib RVR/a flutter status post cardioversion 04/10/2021 on digoxin and amiodarone.  TSH 4.8.  #3 AKI resolved.  Improving with diuresis.  Potassium 4.2.  Mag 2.1.    #4 morbid obesity-makes him more susceptible to complications.  #5 essential hypertension continue above medications.  #6 history of alcohol abuse continue supportive treatment.  #7 macrocytosis likely from alcohol abuse.  #8  Possible undiagnosed sleep apnea will need outpatient work-up   Estimated body mass index is 49.99 kg/m as calculated from the following:   Height as of this encounter: 6\' 3"  (1.905 m).   Weight as of this encounter: 181.4 kg.  DVT prophylaxis: Eliquis  code Status: Full code Family Communication: None at bedside Disposition Plan:  Status is: Inpatient  Dispo: The patient is from: Home              Anticipated d/c is to: Home              Patient currently is not medically stable to d/c.   Difficult to place patient No    Consultants: Cardiology  Procedures: DC cardioversion 04/01/2021 Antimicrobials None Subjective He is resting in bed He had A. fib again overnight he feels his breathing is better Continues to feel short of breath with dyspnea on exertion  Objective: Vitals:   04/02/21 0900 04/02/21 1000 04/02/21 1124 04/02/21 1200  BP: (!) 173/71 (!) 162/60 (!) 155/61   Pulse: 94 97 95   Resp: 16 18 15    Temp:    99.1 F (37.3 C)  TempSrc:    Oral  SpO2: 95% 95% 96%   Weight:      Height:        Intake/Output Summary (Last 24 hours) at 04/02/2021 1243 Last data filed at 04/02/2021 1045 Gross per 24 hour  Intake 2275.76 ml  Output 5400 ml  Net -3124.24 ml   Filed Weights   03/31/21 0800 04/01/21 0402 04/02/21 0421  Weight: (!) 182.2 kg (!) 182.2 kg (!) 181.4 kg    Examination:  General exam: Appears calm and comfortable  Respiratory system crackles bilaterally to auscultation. Respiratory effort normal. Cardiovascular system: S1 & S2 heard, RRR. No JVD, murmurs, rubs, gallops or clicks. No pedal edema. Gastrointestinal system: Abdomen is nondistended, soft and nontender. No organomegaly or masses felt. Normal bowel sounds heard. Central nervous system: Alert and oriented. No focal neurological deficits. Extremities: 3+ hard pitting edema up to upper thighs. Skin: No rashes, lesions or ulcers Psychiatry: Judgement and insight appear normal. Mood & affect appropriate.     Data Reviewed: I  have personally reviewed following labs and imaging studies  CBC: Recent Labs  Lab 03/27/21 0305 03/28/21 0223 04/01/21 0247 04/02/21 0624  WBC 9.6 9.5 8.2 7.8  HGB 15.3 15.4 16.2 15.9  HCT 48.6 48.8 50.6 49.4  MCV 105.0* 104.7* 104.1* 102.9*  PLT 144* 154 150 144   Basic Metabolic Panel: Recent Labs  Lab 03/27/21 0305 03/27/21 2120 03/28/21 0223 03/29/21 0339 04/03/2021 0254 03/31/21 0647 04/01/21 0247 04/02/21 0624  NA 140   < > 138 139 134* 137 136 136  K 3.7   < > 3.9 4.3  4.4 5.0 4.8 4.2  CL 96*   < > 93* 94* 92* 95* 93* 90*  CO2 37*   < > 38* 39* 35* 37* 36* 36*  GLUCOSE 118*   < > 119* 111* 127* 113* 122* 109*  BUN 16   < > 18 24* 28* 39* 37* 24*  CREATININE 1.05   < > 1.01 1.05 0.75 1.44* 1.22 0.92  CALCIUM 8.1*   < > 8.3* 8.4* 8.3* 8.7* 8.4* 8.3*  MG 1.7  --  2.1  --   --   --  2.3 2.1   < > = values in this interval not displayed.   GFR: Estimated Creatinine Clearance: 121 mL/min (by C-G formula based on SCr of 0.92 mg/dL). Liver Function Tests: Recent Labs  Lab 03/27/21 2120  AST 52*  ALT 36  ALKPHOS 50  BILITOT 0.9  PROT 6.7  ALBUMIN 3.3*   No results for input(s): LIPASE, AMYLASE in the last 168 hours. No results for input(s): AMMONIA in the last 168 hours. Coagulation Profile: No results for input(s): INR, PROTIME in the last 168 hours. Cardiac Enzymes: No results for input(s): CKTOTAL, CKMB, CKMBINDEX, TROPONINI in the last 168 hours. BNP (last 3 results) No results for input(s): PROBNP in the last 8760 hours. HbA1C: No results for input(s): HGBA1C in the last 72 hours. CBG: No results for input(s): GLUCAP in the last 168 hours. Lipid Profile: No results for input(s): CHOL, HDL, LDLCALC, TRIG, CHOLHDL, LDLDIRECT in the last 72 hours. Thyroid Function Tests: No results for input(s): TSH, T4TOTAL, FREET4, T3FREE, THYROIDAB in the last 72 hours. Anemia Panel: No results for input(s): VITAMINB12, FOLATE, FERRITIN, TIBC, IRON, RETICCTPCT in the last 72 hours. Sepsis Labs: Recent Labs  Lab 03/29/2021 1756 04/13/2021 2040  LATICACIDVEN 2.1* 1.6    Recent Results (from the past 240 hour(s))  Resp Panel by RT-PCR (Flu A&B, Covid) Nasopharyngeal Swab     Status: None   Collection Time: 04/11/2021  9:24 PM   Specimen: Nasopharyngeal Swab; Nasopharyngeal(NP) swabs in vial transport medium  Result Value Ref Range Status   SARS Coronavirus 2 by RT PCR NEGATIVE NEGATIVE Final    Comment: (NOTE) SARS-CoV-2 target nucleic acids are NOT  DETECTED.  The SARS-CoV-2 RNA is generally detectable in upper respiratory specimens during the acute phase of infection. The lowest concentration of SARS-CoV-2 viral copies this assay can detect is 138 copies/mL. A negative result does not preclude SARS-Cov-2 infection and should not be used as the sole basis for treatment or other patient management decisions. A negative result may occur with  improper specimen collection/handling, submission of specimen other than nasopharyngeal swab, presence of viral mutation(s) within the areas targeted by this assay, and inadequate number of viral copies(<138 copies/mL). A negative result must be combined with clinical observations, patient history, and epidemiological information. The expected result is Negative.  Fact Sheet for Patients:  EntrepreneurPulse.com.au  Fact  Sheet for Healthcare Providers:  IncredibleEmployment.be  This test is no t yet approved or cleared by the [place] FDA and  has been authorized for detection and/or diagnosis of SARS-CoV-2 by FDA under an Emergency Use Authorization (EUA). This EUA will remain  in effect (meaning this test can be used) for the duration of the COVID-19 declaration under Section 564(b)(1) of the Act, 21 U.S.C.section 360bbb-3(b)(1), unless the authorization is terminated  or revoked sooner.       Influenza A by PCR NEGATIVE NEGATIVE Final   Influenza B by PCR NEGATIVE NEGATIVE Final    Comment: (NOTE) The Xpert Xpress SARS-CoV-2/FLU/RSV plus assay is intended as an aid in the diagnosis of influenza from Nasopharyngeal swab specimens and should not be used as a sole basis for treatment. Nasal washings and aspirates are unacceptable for Xpert Xpress SARS-CoV-2/FLU/RSV testing.  Fact Sheet for Patients: EntrepreneurPulse.com.au  Fact Sheet for Healthcare Providers: IncredibleEmployment.be  This test is not yet  approved or cleared by the [place] FDA and has been authorized for detection and/or diagnosis of SARS-CoV-2 by FDA under an Emergency Use Authorization (EUA). This EUA will remain in effect (meaning this test can be used) for the duration of the COVID-19 declaration under Section 564(b)(1) of the Act, 21 U.S.C. section 360bbb-3(b)(1), unless the authorization is terminated or revoked.  Performed at [facility], [place] [address] [place], [place] [address]   MRSA PCR Screening     Status: None   Collection Time: 03/25/21  8:45 AM   Specimen: Nasal Mucosa; Nasopharyngeal  Result Value Ref Range Status   MRSA by PCR NEGATIVE NEGATIVE Final    Comment:        The GeneXpert MRSA Assay (FDA approved for NASAL specimens only), is one component of a comprehensive MRSA colonization surveillance program. It is not intended to diagnose MRSA infection nor to guide or monitor treatment for MRSA infections. Performed at [facility], [place] [address] [place], [place] [address]          Radiology Studies: No results found.      Scheduled Meds: . apixaban  5 mg Oral BID  . carvedilol  12.5 mg Oral BID WC  . Chlorhexidine Gluconate Cloth  6 each Topical Daily  . folic acid  1 mg Oral Daily  . gabapentin  600 mg Oral QPM  . levalbuterol  2 puff Inhalation TID  . mouth rinse  15 mL Mouth Rinse BID  . multivitamin with minerals  1 tablet Oral Daily  . potassium chloride  40 mEq Oral Daily  . sacubitril-valsartan  1 tablet Oral BID  . sodium chloride flush  10-40 mL Intracatheter Q12H  . spironolactone  25 mg Oral Daily  . testosterone cypionate  200 mg Intramuscular Q14 Days  . thiamine  100 mg Oral Daily   Or  . thiamine  100 mg Intravenous Daily  . vitamin B-12  1,000 mcg Oral Daily   Continuous Infusions: . sodium chloride 10 mL/hr at 04/02/21 1000  . amiodarone 30 mg/hr (04/02/21 1000)  . furosemide (LASIX) 200 mg in dextrose 5%  100 mL (2mg /mL) infusion 15 mg/hr (04/02/21 1052)     LOS: 9 days   Georgette Shell, MD  04/02/2021, 12:43 PM

## 2021-04-02 NOTE — Progress Notes (Signed)
Orthopedic Tech Progress Note Patient Details:  Jose Fitzpatrick Mar 29, 1946 [id number]  Ortho Devices Type of Ortho Device: Louretta Parma boot Ortho Device/Splint Location: Bi LE Ortho Device/Splint Interventions: Application   Post Interventions Patient Tolerated: Well   Jose Fitzpatrick E [name] 04/02/2021, 11:25 AM

## 2021-04-02 NOTE — Consult Note (Signed)
WOC Nurse Consult Note: Reason for Consult: Bilateral Unna's Boot application Wound type: Venous insufficiency Pressure Injury POA: N/A Measurement:N/A Wound bed:N/A Drainage (amount, consistency, odor) Small serous Periwound:N/A Dressing procedure/placement/frequency: I have implemented a POC for the application of twice weekly Unna's Boots by ortho techs.  Embedded in the Order is guidance for Nursing to remove the Unna's boots on the days they are to be changed for cleansing of the extremities.  In addition to the requested POC for Unna's Boot application, I have provided bilateral pressure redistribution heel boots (Prevalon) for pressure injury prevention.  [place] nursing team will not follow, but will remain available to this patient, the nursing and medical teams.  Please re-consult if needed. Thanks, Maudie Flakes, MSN, RN, [place], Arther Abbott  Pager# [phone]

## 2021-04-03 DIAGNOSIS — I4891 Unspecified atrial fibrillation: Secondary | ICD-10-CM | POA: Diagnosis not present

## 2021-04-03 LAB — CBC
HCT: 51.1 % (ref 39.0–52.0)
Hemoglobin: 16.4 g/dL (ref 13.0–17.0)
MCH: 32.9 pg (ref 26.0–34.0)
MCHC: 32.1 g/dL (ref 30.0–36.0)
MCV: 102.4 fL — ABNORMAL HIGH (ref 80.0–100.0)
Platelets: 176 10*3/uL (ref 150–400)
RBC: 4.99 MIL/uL (ref 4.22–5.81)
RDW: 13.2 % (ref 11.5–15.5)
WBC: 7.4 10*3/uL (ref 4.0–10.5)
nRBC: 0 % (ref 0.0–0.2)

## 2021-04-03 LAB — BASIC METABOLIC PANEL
Anion gap: 6 (ref 5–15)
BUN: 28 mg/dL — ABNORMAL HIGH (ref 8–23)
CO2: 39 mmol/L — ABNORMAL HIGH (ref 22–32)
Calcium: 7.9 mg/dL — ABNORMAL LOW (ref 8.9–10.3)
Chloride: 91 mmol/L — ABNORMAL LOW (ref 98–111)
Creatinine, Ser: 1.14 mg/dL (ref 0.61–1.24)
GFR, Estimated: >60 mL/min (ref >60)
Glucose, Bld: 116 mg/dL — ABNORMAL HIGH (ref 70–99)
Potassium: 4.2 mmol/L (ref 3.5–5.1)
Sodium: 136 mmol/L (ref 135–145)

## 2021-04-03 LAB — MAGNESIUM: Magnesium: 2.1 mg/dL (ref 1.7–2.4)

## 2021-04-03 MED ORDER — METOPROLOL TARTRATE 25 MG PO TABS
25.0000 mg | ORAL_TABLET | Freq: Four times a day (QID) | ORAL | Status: DC
  Administered 2021-04-03 – 2021-04-04 (×3): 25 mg via ORAL
  Filled 2021-04-03 (×3): qty 1

## 2021-04-03 MED ORDER — METOLAZONE 5 MG PO TABS
10.0000 mg | ORAL_TABLET | Freq: Once | ORAL | Status: AC
  Administered 2021-04-03: 10 mg via ORAL
  Filled 2021-04-03: qty 2

## 2021-04-03 MED ORDER — AMIODARONE HCL 150 MG/3ML IV SOLN: 150.0000 mg | Freq: Once | INTRAVENOUS | Status: DC

## 2021-04-03 MED ORDER — AMIODARONE LOAD VIA INFUSION
150.0000 mg | Freq: Once | INTRAVENOUS | Status: AC
  Administered 2021-04-03: 150 mg via INTRAVENOUS
  Filled 2021-04-03: qty 83.34

## 2021-04-03 NOTE — Progress Notes (Signed)
OT Cancellation Note  Patient Details Name: Jose Fitzpatrick MRN: [id number] DOB: November 02, 1946   Cancelled Treatment:    Reason Eval/Treat Not Completed: Medical issues which prohibited therapy. Patient in a.fib with HR up to 137 and sustaining around 130. Will hold therapy for now.   Jose Fitzpatrick L Rishab Stoudt 04/03/2021, 3:51 PM

## 2021-04-03 NOTE — Progress Notes (Signed)
Cardiology Progress Note  Patient ID: Jose Fitzpatrick MRN: [id number] DOB: February 06, 1946 Date of Encounter: 04/03/2021  Primary Cardiologist: Kirk Ruths, MD  Subjective   Chief Complaint: Shortness of breath  HPI: 4 L urine output overnight.  Still volume overloaded.  In A. fib with RVR.  Reports he gets short of breath when he lays back.  ROS:  All other ROS reviewed and negative. Pertinent positives noted in the HPI.     Inpatient Medications  Scheduled Meds: . amiodarone  150 mg Intravenous Once  . apixaban  5 mg Oral BID  . Chlorhexidine Gluconate Cloth  6 each Topical Daily  . folic acid  1 mg Oral Daily  . gabapentin  600 mg Oral QPM  . mouth rinse  15 mL Mouth Rinse BID  . metolazone  10 mg Oral Once  . metoprolol tartrate  25 mg Oral Q6H  . multivitamin with minerals  1 tablet Oral Daily  . potassium chloride  40 mEq Oral Daily  . sacubitril-valsartan  1 tablet Oral BID  . sodium chloride flush  10-40 mL Intracatheter Q12H  . spironolactone  25 mg Oral Daily  . testosterone cypionate  200 mg Intramuscular Q14 Days  . thiamine  100 mg Oral Daily   Or  . thiamine  100 mg Intravenous Daily  . vitamin B-12  1,000 mcg Oral Daily   Continuous Infusions: . sodium chloride 10 mL/hr at 04/02/21 2000  . amiodarone 30 mg/hr (04/03/21 0810)  . furosemide (LASIX) 200 mg in dextrose 5% 100 mL (2mg /mL) infusion 20 mg/hr (04/03/21 0634)   PRN Meds: sodium chloride, sodium chloride flush   Vital Signs   Vitals:   04/03/21 0500 04/03/21 0600 04/03/21 0700 04/03/21 0741  BP: 108/72 108/71    Pulse: 82 74 (!) 53   Resp: 18 13 14    Temp:    97.6 F (36.4 C)  TempSrc:    Axillary  SpO2: 96% 95% 96%   Weight: (!) 178.7 kg     Height:        Intake/Output Summary (Last 24 hours) at 04/03/2021 0844 Last data filed at 04/03/2021 5852 Gross per 24 hour  Intake 2592.47 ml  Output 4200 ml  Net -1607.53 ml   Last 3 Weights 04/03/2021 04/02/2021 04/01/2021  Weight (lbs)  393 lb 15.4 oz 399 lb 14.6 oz 401 lb 10.9 oz  Weight (kg) 178.7 kg 181.4 kg 182.2 kg      Telemetry  Overnight telemetry shows A. fib with RVR up to 150 bpm, which I personally reviewed.   Physical Exam   Vitals:   04/03/21 0500 04/03/21 0600 04/03/21 0700 04/03/21 0741  BP: 108/72 108/71    Pulse: 82 74 (!) 53   Resp: 18 13 14    Temp:    97.6 F (36.4 C)  TempSrc:    Axillary  SpO2: 96% 95% 96%   Weight: (!) 178.7 kg     Height:         Intake/Output Summary (Last 24 hours) at 04/03/2021 0844 Last data filed at 04/03/2021 7782 Gross per 24 hour  Intake 2592.47 ml  Output 4200 ml  Net -1607.53 ml    Last 3 Weights 04/03/2021 04/02/2021 04/01/2021  Weight (lbs) 393 lb 15.4 oz 399 lb 14.6 oz 401 lb 10.9 oz  Weight (kg) 178.7 kg 181.4 kg 182.2 kg    Body mass index is 49.24 kg/m.  General: Ill-appearing, tachypnea noted Head: Atraumatic, normal size  Eyes: PEERLA, EOMI  Neck: Supple, JVD difficult to assess due to neck adiposity Endocrine: No thryomegaly Cardiac: Normal S1, S2; irregular rhythm Lungs: Diminished breath sounds bilaterally, Rales Abd: Soft, nontender, no hepatomegaly  Ext: 2+ pitting edema up to the thighs Musculoskeletal: No deformities, BUE and BLE strength normal and equal Skin: Warm and dry, no rashes   Neuro: Alert and oriented to person, place, time, and situation, CNII-XII grossly intact, no focal deficits  Psych: Normal mood and affect   Labs  High Sensitivity Troponin:   Recent Labs  Lab 03/25/2021 2023 04/06/2021 2147  TROPONINIHS 12 12     Cardiac EnzymesNo results for input(s): TROPONINI in the last 168 hours. No results for input(s): TROPIPOC in the last 168 hours.  Chemistry Recent Labs  Lab 03/27/21 2120 03/28/21 0223 04/01/21 0247 04/02/21 0624 04/03/21 0601  NA 138   < > 136 136 136  K 4.0   < > 4.8 4.2 4.2  CL 94*   < > 93* 90* 91*  CO2 36*   < > 36* 36* 39*  GLUCOSE 130*   < > 122* 109* 116*  BUN 19   < > 37* 24* 28*   CREATININE 0.95   < > 1.22 0.92 1.14  CALCIUM 8.2*   < > 8.4* 8.3* 7.9*  PROT 6.7  --   --   --   --   ALBUMIN 3.3*  --   --   --   --   AST 52*  --   --   --   --   ALT 36  --   --   --   --   ALKPHOS 50  --   --   --   --   BILITOT 0.9  --   --   --   --   GFRNONAA >60   < > >60 >60 >60  ANIONGAP 8   < > 7 10 6    < > = values in this interval not displayed.    Hematology Recent Labs  Lab 04/01/21 0247 04/02/21 0624 04/03/21 0601  WBC 8.2 7.8 7.4  RBC 4.86 4.80 4.99  HGB 16.2 15.9 16.4  HCT 50.6 49.4 51.1  MCV 104.1* 102.9* 102.4*  MCH 33.3 33.1 32.9  MCHC 32.0 32.2 32.1  RDW 13.4 13.2 13.2  PLT 150 156 176   BNPNo results for input(s): BNP, PROBNP in the last 168 hours.  DDimer No results for input(s): DDIMER in the last 168 hours.   Radiology  No results found.  Cardiac Studies  TEE 04/02/2021 1. Left ventricular ejection fraction, by estimation, is 25 to 30%. The  left ventricle has severely decreased function.  2. Right ventricular systolic function is moderately reduced. The right  ventricular size is normal.  3. Left atrial size was moderately dilated. No left atrial/left atrial  appendage thrombus was detected.  4. Right atrial size was moderately dilated.  5. The mitral valve is normal in structure. Mild mitral valve  regurgitation. No evidence of mitral stenosis.  6. The aortic valve is tricuspid. There is mild calcification of the  aortic valve. There is mild thickening of the aortic valve. Aortic valve  regurgitation is not visualized. Mild aortic valve sclerosis is present,  with no evidence of aortic valve  stenosis.   Patient Profile  Jose Doro Carpenteris a 75 y.o.malewith hypertension, morbid obesity, chronic venous insufficiency, possible alcohol abuse who was admitted on 04/08/2021 with shortness of breath and acute decompensated systolic heart failure.  Found to be in atrial flutter with RVR. He underwent cardioversion for atrial flutter  on 2021-04-07.  He had recurrence of atrial fibrillation on 03/31/2021.  Course now complicated by extreme volume overload and decompensated systolic heart failure.  Assessment & Plan   1.  New onset systolic heart failure, EF 25-30% -Suspect this is related to arrhythmia.  Arrived in atrial flutter.  Now in A. fib.  Failed cardioversion. -Still volume overloaded.  Increase Lasix drip to 20 mg/hr. we will add metolazone 10 mg as a one-time dose today. -Close monitoring of electrolytes. -Net -22 L since admission.  Still has a ways to go. -In A. fib with RVR.  On amiodarone drip.  We will give him an amnio bolus.  Given that his Co. ox is normal we will start him on metoprolol tartrate 25 every 6 hours for better rate control.  Stop Coreg. -Continue Entresto 24-26 mg twice daily. -Continue Aldactone 25 mg daily -We will hold on SGLT2 inhibitor given morbid obesity as he is high risk for UTI -We will aim for 3 to 5 L of urine output daily.  Please limit his p.o. intake.  Strict I's and O's.  2.  New onset atrial flutter/now with atrial fibrillation -Failed cardioversion for flutter.  Now in A. Fib. -Continue with amiodarone drip. -We will bolus him to see if we can get him back in sinus rhythm. -We will add metoprolol tartrate 25 every 6 hours for better rate control.  Stop Coreg. -Continue Eliquis. -His A. fib is being driven by volume overload.  For questions or updates, please contact [place] Please consult [email or web address] for contact info under   Time Spent with Patient: I have spent a total of 35 minutes with patient reviewing hospital notes, telemetry, EKGs, labs and examining the patient as well as establishing an assessment and plan that was discussed with the patient.  > 50% of time was spent in direct patient care.    Signed, Addison Naegeli. Audie Box, MD, [place]  04/03/2021 8:44 AM

## 2021-04-03 NOTE — Progress Notes (Signed)
Patient refused PT today. Asked this nurse to help transfer him to [facility]. Patient was not able to support his weight at all and not able to transfer to [facility] with 2 assist.   Will encourage patient to participate in PT/OT.

## 2021-04-03 NOTE — Progress Notes (Signed)
PROGRESS NOTE    DIOR STEPTER  ZJI:[id number] DOB: 1945/12/08 DOA: 03/31/2021 PCP: Tamsen Roers, MD   No chief complaint on file.  Brief Narrative:  75 year old morbidly obese male with Yazir Koerber BMI of 50, hypertension, peripheral neuropathy admitted with rapid Jacquline Terrill. fib/flutter/CHF exacerbation.  He underwent TEE cardioversion 04-11-2021.  He's currently being treated for heart failure exacerbation and afib with RVR with assistance from cardiology.    Assessment & Plan:   Principal Problem:   Atrial fibrillation with RVR (HCC) Active Problems:   HTN (hypertension)   Acute CHF (congestive heart failure) (HCC)   Acute respiratory failure with hypoxia (HCC)   Elevated TSH   BMI 50.0-59.9, adult (HCC)  #1 new onset acute systolic heart failure  Acute Hypoxic Resp Failure Currently on 5 L (on RA at baseline) CXR 5/10 with interstitial pulm edema and small bilateral effusions echocardiogram 5/16 with EF 25-30% with ejection fraction 25% Appreciate cardiology c/s Suspected related to arrhythmia Continue lasix gtt.  Metolazone x1 today. Entresto, sprionolactone. Holding on SGLT2 inhibitor.   Strict I/O, daily weights  #2 Collene Massimino. fib RVR/Kymberley Raz flutter status post cardioversion 2021/04/11  Back in fib Continue amiodarone gtt, bolused Metoprolol per cardiology Eliquis   #3 AKI  Fluctuating, improved overall - follow with continued diuresis  #4 morbid obesity-makes him more susceptible to complications. Body mass index is 49.24 kg/m.  #5 essential hypertension  Lasix, metoprolol, entresto, spironolactone  #6 history of alcohol abuse continue supportive treatment.  #7 macrocytosis likely from alcohol abuse Normal B12, folate  #8  Possible undiagnosed sleep apnea will need outpatient work-up   DVT prophylaxis: eliquis Code Status: full  Family Communication: none at bedside Disposition:   Status is: Inpatient  Remains inpatient appropriate because:Inpatient level of care  appropriate due to severity of illness   Dispo: The patient is from: Home              Anticipated d/c is to: Home              Patient currently is not medically stable to d/c.   Difficult to place patient No       Consultants:   cardiology  Procedures:  TEE IMPRESSIONS    1. Left ventricular ejection fraction, by estimation, is 25 to 30%. The  left ventricle has severely decreased function.  2. Right ventricular systolic function is moderately reduced. The right  ventricular size is normal.  3. Left atrial size was moderately dilated. No left atrial/left atrial  appendage thrombus was detected.  4. Right atrial size was moderately dilated.  5. The mitral valve is normal in structure. Mild mitral valve  regurgitation. No evidence of mitral stenosis.  6. The aortic valve is tricuspid. There is mild calcification of the  aortic valve. There is mild thickening of the aortic valve. Aortic valve  regurgitation is not visualized. Mild aortic valve sclerosis is present,  with no evidence of aortic valve   cardioversion   Antimicrobials: Anti-infectives (From admission, onward)   None         Subjective: No new complaints  Objective: Vitals:   04/03/21 0700 04/03/21 0741 04/03/21 1201 04/03/21 1300  BP:    (!) 187/169  Pulse: (!) 53   72  Resp: 14   15  Temp:  97.6 F (36.4 C) 97.6 F (36.4 C)   TempSrc:  Axillary Oral   SpO2: 96%   96%  Weight:      Height:  Intake/Output Summary (Last 24 hours) at 04/03/2021 1428 Last data filed at 04/03/2021 1300 Gross per 24 hour  Intake 2057.19 ml  Output 3475 ml  Net -1417.81 ml   Filed Weights   04/01/21 0402 04/02/21 0421 04/03/21 0500  Weight: (!) 182.2 kg (!) 181.4 kg (!) 178.7 kg    Examination:  General exam: Appears calm and comfortable  Respiratory system: Clear to auscultation. Respiratory effort normal. Cardiovascular system: irregularly irregular tachycardic Gastrointestinal system:  Abdomen is nondistended, soft and nontender. Central nervous system: Alert and oriented. No focal neurological deficits. Extremities: bilateral LE edema, unna boots  Skin: No rashes, lesions or ulcers Psychiatry: Judgement and insight appear normal. Mood & affect appropriate.     Data Reviewed: I have personally reviewed following labs and imaging studies  CBC: Recent Labs  Lab 03/28/21 0223 04/01/21 0247 04/02/21 0624 04/03/21 0601  WBC 9.5 8.2 7.8 7.4  HGB 15.4 16.2 15.9 16.4  HCT 48.8 50.6 49.4 51.1  MCV 104.7* 104.1* 102.9* 102.4*  PLT 154 150 156 161    Basic Metabolic Panel: Recent Labs  Lab 03/28/21 0223 03/29/21 0339 04/05/2021 0254 03/31/21 0647 04/01/21 0247 04/02/21 0624 04/03/21 0601  NA 138   < > 134* 137 136 136 136  K 3.9   < > 4.4 5.0 4.8 4.2 4.2  CL 93*   < > 92* 95* 93* 90* 91*  CO2 38*   < > 35* 37* 36* 36* 39*  GLUCOSE 119*   < > 127* 113* 122* 109* 116*  BUN 18   < > 28* 39* 37* 24* 28*  CREATININE 1.01   < > 0.75 1.44* 1.22 0.92 1.14  CALCIUM 8.3*   < > 8.3* 8.7* 8.4* 8.3* 7.9*  MG 2.1  --   --   --  2.3 2.1 2.1   < > = values in this interval not displayed.    GFR: Estimated Creatinine Clearance: 96.8 mL/min (by C-G formula based on SCr of 1.14 mg/dL).  Liver Function Tests: Recent Labs  Lab 03/27/21 2120  AST 52*  ALT 36  ALKPHOS 50  BILITOT 0.9  PROT 6.7  ALBUMIN 3.3*    CBG: No results for input(s): GLUCAP in the last 168 hours.   Recent Results (from the past 240 hour(s))  Resp Panel by RT-PCR (Flu Shanna Strength&B, Covid) Nasopharyngeal Swab     Status: None   Collection Time: 03/17/2021  9:24 PM   Specimen: Nasopharyngeal Swab; Nasopharyngeal(NP) swabs in vial transport medium  Result Value Ref Range Status   SARS Coronavirus 2 by RT PCR NEGATIVE NEGATIVE Final    Comment: (NOTE) SARS-CoV-2 target nucleic acids are NOT DETECTED.  The SARS-CoV-2 RNA is generally detectable in upper respiratory specimens during the acute phase of  infection. The lowest concentration of SARS-CoV-2 viral copies this assay can detect is 138 copies/mL. Dorella Laster negative result does not preclude SARS-Cov-2 infection and should not be used as the sole basis for treatment or other patient management decisions. Jlyn Bracamonte negative result may occur with  improper specimen collection/handling, submission of specimen other than nasopharyngeal swab, presence of viral mutation(s) within the areas targeted by this assay, and inadequate number of viral copies(<138 copies/mL). Marabeth Melland negative result must be combined with clinical observations, patient history, and epidemiological information. The expected result is Negative.  Fact Sheet for Patients:  EntrepreneurPulse.com.au  Fact Sheet for Healthcare Providers:  IncredibleEmployment.be  This test is no t yet approved or cleared by the [place] and  has been authorized for detection and/or diagnosis of SARS-CoV-2 by FDA under an Emergency Use Authorization (EUA). This EUA will remain  in effect (meaning this test can be used) for the duration of the COVID-19 declaration under Section 564(b)(1) of the Act, 21 U.S.C.section 360bbb-3(b)(1), unless the authorization is terminated  or revoked sooner.       Influenza Sina Sumpter by PCR NEGATIVE NEGATIVE Final   Influenza B by PCR NEGATIVE NEGATIVE Final    Comment: (NOTE) The Xpert Xpress SARS-CoV-2/FLU/RSV plus assay is intended as an aid in the diagnosis of influenza from Nasopharyngeal swab specimens and should not be used as Demarri Elie sole basis for treatment. Nasal washings and aspirates are unacceptable for Xpert Xpress SARS-CoV-2/FLU/RSV testing.  Fact Sheet for Patients: EntrepreneurPulse.com.au  Fact Sheet for Healthcare Providers: IncredibleEmployment.be  This test is not yet approved or cleared by the [place] FDA and has been authorized for detection and/or diagnosis of SARS-CoV-2  by FDA under an Emergency Use Authorization (EUA). This EUA will remain in effect (meaning this test can be used) for the duration of the COVID-19 declaration under Section 564(b)(1) of the Act, 21 U.S.C. section 360bbb-3(b)(1), unless the authorization is terminated or revoked.  Performed at [facility], [place] [address], [place], [place] [address]   MRSA PCR Screening     Status: None   Collection Time: 03/25/21  8:45 AM   Specimen: Nasal Mucosa; Nasopharyngeal  Result Value Ref Range Status   MRSA by PCR NEGATIVE NEGATIVE Final    Comment:        The GeneXpert MRSA Assay (FDA approved for NASAL specimens only), is one component of Jaquisha Frech comprehensive MRSA colonization surveillance program. It is not intended to diagnose MRSA infection nor to guide or monitor treatment for MRSA infections. Performed at [facility], [place] [address] [place], [place] [address]          Radiology Studies: No results found.      Scheduled Meds: . apixaban  5 mg Oral BID  . Chlorhexidine Gluconate Cloth  6 each Topical Daily  . folic acid  1 mg Oral Daily  . gabapentin  600 mg Oral QPM  . mouth rinse  15 mL Mouth Rinse BID  . metoprolol tartrate  25 mg Oral Q6H  . multivitamin with minerals  1 tablet Oral Daily  . potassium chloride  40 mEq Oral Daily  . sacubitril-valsartan  1 tablet Oral BID  . sodium chloride flush  10-40 mL Intracatheter Q12H  . spironolactone  25 mg Oral Daily  . testosterone cypionate  200 mg Intramuscular Q14 Days  . thiamine  100 mg Oral Daily   Or  . thiamine  100 mg Intravenous Daily  . vitamin B-12  1,000 mcg Oral Daily   Continuous Infusions: . sodium chloride 10 mL/hr at 04/02/21 2000  . amiodarone 30 mg/hr (04/03/21 0810)  . furosemide (LASIX) 200 mg in dextrose 5% 100 mL (2mg /mL) infusion 20 mg/hr (04/03/21 1042)     LOS: 10 days    Time spent: over 30 min    Fayrene Helper, MD [facility]   To contact the attending provider between 7A-7P or the covering provider during after hours 7P-7A, please log into the web site [email or web address] and access using universal [place] password for that web site. If you do not have the password, please call the hospital operator.  04/03/2021, 2:28 PM

## 2021-04-03 NOTE — Progress Notes (Signed)
Night shift overview:   Patient remains in A Fib. HR 130s -140s. All other VSS throughout the shift. Patient denies chest pain, dizziness, increased shortness of breath or any other symptom of distress. Patient does become dyspneic with exertion and mobility and requests to be positioned at a  90 degree angle. Reports, "laying back make me feel like I can't breath."   Lasix infusion continuous. Strict I/Os documented.  Patient remains calm and pleasant. Alert and Ox4. Will continue to monitor.

## 2021-04-03 NOTE — Progress Notes (Signed)
PT Cancellation Note  Patient Details Name: JAMARL PEW MRN: [id number] DOB: 11-18-1945   Cancelled Treatment:    Reason Eval/Treat Not Completed: Medical issues which prohibited therapy. Patient in Afib with RVR (HR ranging from 116-150 bpm while resting supine in bed). RN notified and will request orders from cardiology for specific safe range to mobilize. Will follow up as schedule allows and pt medically able.  Verner Mould, DPT Acute Rehabilitation Services Office [phone] Pager [phone]

## 2021-04-04 ENCOUNTER — Inpatient Hospital Stay (HOSPITAL_COMMUNITY): Payer: Medicare HMO

## 2021-04-04 DIAGNOSIS — J9692 Respiratory failure, unspecified with hypercapnia: Secondary | ICD-10-CM | POA: Diagnosis present

## 2021-04-04 DIAGNOSIS — I4891 Unspecified atrial fibrillation: Secondary | ICD-10-CM | POA: Diagnosis not present

## 2021-04-04 LAB — BASIC METABOLIC PANEL
Anion gap: 10 (ref 5–15)
BUN: 31 mg/dL — ABNORMAL HIGH (ref 8–23)
CO2: 39 mmol/L — ABNORMAL HIGH (ref 22–32)
Calcium: 8.5 mg/dL — ABNORMAL LOW (ref 8.9–10.3)
Chloride: 85 mmol/L — ABNORMAL LOW (ref 98–111)
Creatinine, Ser: 1.04 mg/dL (ref 0.61–1.24)
GFR, Estimated: >60 mL/min (ref >60)
Glucose, Bld: 108 mg/dL — ABNORMAL HIGH (ref 70–99)
Potassium: 3.9 mmol/L (ref 3.5–5.1)
Sodium: 134 mmol/L — ABNORMAL LOW (ref 135–145)

## 2021-04-04 LAB — URINALYSIS, ROUTINE W REFLEX MICROSCOPIC
Bilirubin Urine: NEGATIVE
Glucose, UA: NEGATIVE mg/dL
Ketones, ur: NEGATIVE mg/dL
Nitrite: NEGATIVE
Protein, ur: NEGATIVE mg/dL
Specific Gravity, Urine: 1.008 (ref 1.005–1.030)
WBC, UA: >50 WBC/hpf — ABNORMAL HIGH (ref 0–5)
pH: 5 (ref 5.0–8.0)

## 2021-04-04 LAB — CBC
HCT: 54.9 % — ABNORMAL HIGH (ref 39.0–52.0)
Hemoglobin: 17.8 g/dL — ABNORMAL HIGH (ref 13.0–17.0)
MCH: 32.7 pg (ref 26.0–34.0)
MCHC: 32.4 g/dL (ref 30.0–36.0)
MCV: 100.9 fL — ABNORMAL HIGH (ref 80.0–100.0)
Platelets: 174 K/uL (ref 150–400)
RBC: 5.44 MIL/uL (ref 4.22–5.81)
RDW: 13.1 % (ref 11.5–15.5)
WBC: 7.6 K/uL (ref 4.0–10.5)
nRBC: 0 % (ref 0.0–0.2)

## 2021-04-04 LAB — LACTIC ACID, PLASMA
Lactic Acid, Venous: 1 mmol/L (ref 0.5–1.9)
Lactic Acid, Venous: 1 mmol/L (ref 0.5–1.9)

## 2021-04-04 LAB — COOXEMETRY PANEL
Carboxyhemoglobin: 0.9 % (ref 0.5–1.5)
Methemoglobin: 1 % (ref 0.0–1.5)
O2 Saturation: 66.7 %
Total hemoglobin: 17.6 g/dL — ABNORMAL HIGH (ref 12.0–16.0)

## 2021-04-04 LAB — MAGNESIUM: Magnesium: 1.9 mg/dL (ref 1.7–2.4)

## 2021-04-04 LAB — PROCALCITONIN: Procalcitonin: <0.1 ng/mL

## 2021-04-04 MED ORDER — METOLAZONE 5 MG PO TABS
10.0000 mg | ORAL_TABLET | Freq: Once | ORAL | Status: AC
  Administered 2021-04-04: 10 mg via ORAL
  Filled 2021-04-04: qty 2

## 2021-04-04 MED ORDER — METOPROLOL TARTRATE 25 MG PO TABS: 25.0000 mg | ORAL_TABLET | Freq: Four times a day (QID) | ORAL | Status: DC

## 2021-04-04 MED ORDER — LIDOCAINE HCL 2 % IJ SOLN
INTRAMUSCULAR | Status: AC
  Filled 2021-04-04: qty 20

## 2021-04-04 MED ORDER — DIGOXIN 0.25 MG/ML IJ SOLN
0.5000 mg | Freq: Once | INTRAMUSCULAR | Status: AC
  Administered 2021-04-04: 0.5 mg via INTRAVENOUS
  Filled 2021-04-04: qty 2

## 2021-04-04 MED ORDER — DIGOXIN 0.25 MG/ML IJ SOLN
0.2500 mg | Freq: Once | INTRAMUSCULAR | Status: AC
  Administered 2021-04-04: 0.25 mg via INTRAVENOUS
  Filled 2021-04-04: qty 2

## 2021-04-04 MED ORDER — DOCUSATE SODIUM 100 MG PO CAPS: 100.0000 mg | ORAL_CAPSULE | Freq: Two times a day (BID) | ORAL | Status: DC | PRN

## 2021-04-04 MED ORDER — POTASSIUM CHLORIDE CRYS ER 20 MEQ PO TBCR
40.0000 meq | EXTENDED_RELEASE_TABLET | Freq: Two times a day (BID) | ORAL | Status: DC
  Administered 2021-04-04 – 2021-04-06 (×5): 40 meq via ORAL
  Filled 2021-04-04 (×5): qty 2

## 2021-04-04 MED ORDER — SODIUM CHLORIDE 0.9 % IV SOLN: INTRAVENOUS | Status: DC | PRN

## 2021-04-04 MED ORDER — METOPROLOL TARTRATE 25 MG PO TABS
50.0000 mg | ORAL_TABLET | Freq: Four times a day (QID) | ORAL | Status: DC
  Administered 2021-04-04: 50 mg via ORAL
  Filled 2021-04-04: qty 2

## 2021-04-04 MED ORDER — NOREPINEPHRINE 4 MG/250ML-% IV SOLN
0.0000 ug/min | INTRAVENOUS | Status: DC
  Administered 2021-04-04: 2 ug/min via INTRAVENOUS
  Administered 2021-04-05: 4 ug/min via INTRAVENOUS
  Filled 2021-04-04 (×2): qty 250

## 2021-04-04 MED ORDER — POLYETHYLENE GLYCOL 3350 17 G PO PACK: 17.0000 g | PACK | Freq: Every day | ORAL | Status: DC | PRN

## 2021-04-04 MED ORDER — DIGOXIN 125 MCG PO TABS
0.1250 mg | ORAL_TABLET | Freq: Every day | ORAL | Status: DC
  Administered 2021-04-05 – 2021-04-06 (×2): 0.125 mg via ORAL
  Filled 2021-04-04 (×2): qty 1

## 2021-04-04 MED ORDER — MAGNESIUM SULFATE 2 GM/50ML IV SOLN
2.0000 g | Freq: Once | INTRAVENOUS | Status: AC
  Administered 2021-04-04: 2 g via INTRAVENOUS
  Filled 2021-04-04: qty 50

## 2021-04-04 NOTE — Progress Notes (Signed)
PT Cancellation Note  Patient Details Name: Jose Fitzpatrick MRN: [id number] DOB: [birth year]   Cancelled Treatment:     Pt medically unable to tolerate Physical Therapy today per chart review   Rica Koyanagi  PTA Acute  Rehabilitation Services Pager      [phone] Office      [phone]

## 2021-04-04 NOTE — Progress Notes (Signed)
Cardiology Progress Note  Patient ID: RUNE MENDEZ MRN: [id number] DOB: 1946/09/25 Date of Encounter: 04/04/2021  Primary Cardiologist: Kirk Ruths, MD  Subjective   Chief Complaint: Shortness of breath  HPI: Still in A. fib with RVR.  Good diuresis.  5 L of urine output.  This is our goal.  Still volume overloaded.  BP soft.  ROS:  All other ROS reviewed and negative. Pertinent positives noted in the HPI.     Inpatient Medications  Scheduled Meds: . apixaban  5 mg Oral BID  . Chlorhexidine Gluconate Cloth  6 each Topical Daily  . digoxin  0.5 mg Intravenous Once   Followed by  . digoxin  0.25 mg Intravenous Once  . [START ON 04/05/2021] digoxin  0.125 mg Oral Daily  . folic acid  1 mg Oral Daily  . gabapentin  600 mg Oral QPM  . mouth rinse  15 mL Mouth Rinse BID  . metoprolol tartrate  50 mg Oral Q6H  . multivitamin with minerals  1 tablet Oral Daily  . potassium chloride  40 mEq Oral BID  . sacubitril-valsartan  1 tablet Oral BID  . sodium chloride flush  10-40 mL Intracatheter Q12H  . spironolactone  25 mg Oral Daily  . testosterone cypionate  200 mg Intramuscular Q14 Days  . thiamine  100 mg Oral Daily   Or  . thiamine  100 mg Intravenous Daily  . vitamin B-12  1,000 mcg Oral Daily   Continuous Infusions: . sodium chloride 10 mL/hr at 04/02/21 2000  . amiodarone 30 mg/hr (04/04/21 0546)  . furosemide (LASIX) 200 mg in dextrose 5% 100 mL (2mg /mL) infusion 20 mg/hr (04/03/21 2200)  . magnesium sulfate bolus IVPB     PRN Meds: sodium chloride, sodium chloride flush   Vital Signs   Vitals:   04/04/21 0100 04/04/21 0200 04/04/21 0409 04/04/21 0510  BP: (!) 87/60 111/71  (!) 162/105  Pulse: (!) 113 70  (!) 140  Resp: 17 14    Temp:   97.6 F (36.4 C)   TempSrc:   Oral   SpO2: 96% 94%    Weight:   (!) 178.3 kg   Height:        Intake/Output Summary (Last 24 hours) at 04/04/2021 0701 Last data filed at 04/04/2021 0513 Gross per 24 hour  Intake  2151.46 ml  Output 5025 ml  Net -2873.54 ml   Last 3 Weights 04/04/2021 04/03/2021 04/02/2021  Weight (lbs) 393 lb 1.3 oz 393 lb 15.4 oz 399 lb 14.6 oz  Weight (kg) 178.3 kg 178.7 kg 181.4 kg      Telemetry  Overnight telemetry shows atrial fibrillation with heart rates up to the 140s, which I personally reviewed.   Physical Exam   Vitals:   04/04/21 0100 04/04/21 0200 04/04/21 0409 04/04/21 0510  BP: (!) 87/60 111/71  (!) 162/105  Pulse: (!) 113 70  (!) 140  Resp: 17 14    Temp:   97.6 F (36.4 C)   TempSrc:   Oral   SpO2: 96% 94%    Weight:   (!) 178.3 kg   Height:         Intake/Output Summary (Last 24 hours) at 04/04/2021 0701 Last data filed at 04/04/2021 0513 Gross per 24 hour  Intake 2151.46 ml  Output 5025 ml  Net -2873.54 ml    Last 3 Weights 04/04/2021 04/03/2021 04/02/2021  Weight (lbs) 393 lb 1.3 oz 393 lb 15.4 oz 399 lb 14.6 oz  Weight (kg) 178.3 kg 178.7 kg 181.4 kg    Body mass index is 49.13 kg/m.  General: Well nourished, well developed, in no acute distress Head: Atraumatic, normal size  Eyes: PEERLA, EOMI  Neck: Supple, difficult JVD assessment given neck adiposity Endocrine: No thryomegaly Cardiac: Normal S1, S2; irregular rhythm, no murmurs rubs or gallops Lungs: Crackles at the lung bases Abd: Soft, nontender, no hepatomegaly  Ext: 2+ pitting edema up to thighs Musculoskeletal: No deformities, BUE and BLE strength normal and equal Skin: Warm and dry, no rashes   Neuro: Alert and oriented to person, place, time, and situation, CNII-XII grossly intact, no focal deficits  Psych: Normal mood and affect   Labs  High Sensitivity Troponin:   Recent Labs  Lab 03/22/2021 2023 03/26/2021 2147  TROPONINIHS 12 12     Cardiac EnzymesNo results for input(s): TROPONINI in the last 168 hours. No results for input(s): TROPIPOC in the last 168 hours.  Chemistry Recent Labs  Lab 04/02/21 0624 04/03/21 0601 04/04/21 0500  NA 136 136 134*  K 4.2 4.2 3.9  CL  90* 91* 85*  CO2 36* 39* 39*  GLUCOSE 109* 116* 108*  BUN 24* 28* 31*  CREATININE 0.92 1.14 1.04  CALCIUM 8.3* 7.9* 8.5*  GFRNONAA >60 >60 >60  ANIONGAP 10 6 10     Hematology Recent Labs  Lab 04/02/21 0624 04/03/21 0601 04/04/21 0500  WBC 7.8 7.4 7.6  RBC 4.80 4.99 5.44  HGB 15.9 16.4 17.8*  HCT 49.4 51.1 54.9*  MCV 102.9* 102.4* 100.9*  MCH 33.1 32.9 32.7  MCHC 32.2 32.1 32.4  RDW 13.2 13.2 13.1  PLT 156 176 174   BNPNo results for input(s): BNP, PROBNP in the last 168 hours.  DDimer No results for input(s): DDIMER in the last 168 hours.   Radiology  No results found.  Cardiac Studies  TEE 2021/04/21 1. Left ventricular ejection fraction, by estimation, is 25 to 30%. The  left ventricle has severely decreased function.  2. Right ventricular systolic function is moderately reduced. The right  ventricular size is normal.  3. Left atrial size was moderately dilated. No left atrial/left atrial  appendage thrombus was detected.  4. Right atrial size was moderately dilated.  5. The mitral valve is normal in structure. Mild mitral valve  regurgitation. No evidence of mitral stenosis.  6. The aortic valve is tricuspid. There is mild calcification of the  aortic valve. There is mild thickening of the aortic valve. Aortic valve  regurgitation is not visualized. Mild aortic valve sclerosis is present,  with no evidence of aortic valve  stenosis.   Patient Profile  Declan Mier Carpenteris a 75 y.o.malewith hypertension, morbid obesity, chronic venous insufficiency, possible alcohol abuse who was admitted on 03/23/2021 with shortness of breath and acute decompensated systolic heart failure.  He reported 80 to 100 pound weight gain over the past few months. Found to be in atrial flutter with RVR. He underwent cardioversion for atrial flutter on 04/21/21.  He had recurrence of atrial fibrillation on 03/31/2021.  Course now complicated by extreme volume overload and  decompensated systolic heart failure.  Assessment & Plan   1.  New onset systolic heart failure, EF 25-30% -Admitted in atrial flutter.  Suspect this is related to an arrhythmia.  He has failed his cardioversion. -Still volume overloaded.  Good response to Lasix drip.  We will continue Lasix drip 20 mg/h. -We will give another dose of metolazone today. -Goal is 3 to 5 L  urine output daily. -He is on Entresto 24-26 mg twice daily and Aldactone 25 mg daily for afterload reduction.  Would be cautious with SGLT2 inhibitor given obesity and high risk for UTI. -He is on metoprolol tartrate for rate control.  We will add digoxin back for better rate control of his A. Fib. -I suspect he will need a repeat cardioversion closer to discharge.  He needs to be more dry before this is attempted. -We will give him 40 mEq of potassium twice daily. -Keep potassium above 4 and magnesium above 2 -MVO2 shows he is not in cardiogenic shock -He has a PICC line in place for ease of medications.  He reported nearly 100 pounds of fluid weight gain.  He still has pitting edema up to the thighs.  Regardless he is net -25 L this admission.  He had nearly 36 L of urine output.  I suspect he has a bit more to go.  2.  New onset atrial flutter/now with A. Fib -Failed cardioversion on 04-18-2021.  Was in flutter.  Now in fib. -BPs have been soft. -MVO2 shows she is not in cardiogenic shock -Increase metoprolol to tartrate to 50 mg every 6 hours -We will load him with digoxin 0.5 mg as a one-time dose this morning and then 0.25 mg IV 6 hours later.  I will check a dig level in the morning -Plan for 0.125 mg digoxin daily -Continue Eliquis 5 mg twice daily.  Likely will need a repeat attempt at cardioversion once he is closer to discharge.  Hopefully he will convert back to sinus rhythm while on amiodarone.  We will continue amiodarone drip.  Hopefully we get his rate control with digoxin and metoprolol.  For questions or  updates, please contact [place] Please consult [email or web address] for contact info under   Time Spent with Patient: I have spent a total of 35 minutes with patient reviewing hospital notes, telemetry, EKGs, labs and examining the patient as well as establishing an assessment and plan that was discussed with the patient.  > 50% of time was spent in direct patient care.    Signed, Addison Naegeli. Audie Box, MD, [place]  04/04/2021 7:01 AM

## 2021-04-04 NOTE — Procedures (Signed)
Arterial line attempted left radial unsuccessfully

## 2021-04-04 NOTE — Progress Notes (Signed)
OT Cancellation Note  Patient Details Name: Jose Fitzpatrick MRN: [id number] DOB: [birth year]   Cancelled Treatment:    Reason Eval/Treat Not Completed: Medical issues which prohibited therapy. Continues to have elevated HR with Afib and now low pressures. Hold per RN.  [name] L Kunta Hilleary 04/04/2021, 12:27 PM

## 2021-04-04 NOTE — Progress Notes (Signed)
Notified by hospital medicine that Mr. Jose Fitzpatrick is hypotensive.  Systolic blood pressure in the 80s.  MVO2 66 which goes against cardiogenic shock.  Most recent blood pressure 86/46.  Low diastolic pressures against cardiogenic shock as well.  He is in A. fib with RVR but has been in A. fib with RVR.  I do not think this explains his hypotension.  I would recommend against cardioversion.  Lactic acid is pending.  Would recommend general sepsis work-up.  Also may just need to start with an arterial line to determine if BPs are accurate.  He has been in the hospital for extended time so infection risk is high.  He ultimately will benefit from transfer to [facility].  I have recommended critical care medicine assessment while he is there.  If pressors are needed, Levophed would be the best option.  Again his MVO2 shows he is not in cardiogenic shock.  Jose Bells T. Audie Box, MD, [place]  [address], [place], [place] [address] [phone]  11:21 AM

## 2021-04-04 NOTE — Consult Note (Signed)
NAME:  Jose Fitzpatrick, MRN:  [id number], DOB:  24-Sep-1946, LOS: 8 ADMISSION DATE:  04/03/2021, CONSULTATION DATE: 04/04/2021 REFERRING MD: Dr. Florene Glen, CHIEF COMPLAINT: Hypotension  History of Present Illness:  Asked to see patient for hypotension Has been in the hospital since 04/06/2021 Being treated for A. fib with RVR, being diuresed with hypotension  He has a background history of hypertension, peripheral neuropathy Had a TEE cardioversion 03/29/2021  Pertinent  Medical History   Past Medical History:  Diagnosis Date  . Allergy   . Back pain   . Cancer ([place])    colon  . Hypertension   . Numbness   . Weakness of right lower extremity      Significant Hospital Events: Including procedures, antibiotic start and stop dates in addition to other pertinent events   . TEE cardioversion 03/29/2021 .   Interim History / Subjective:  Noted to be hypotensive this morning He is on diuresis with Lasix  Objective   Blood pressure (!) 86/46, pulse 64, temperature (!) 97.5 F (36.4 C), temperature source Oral, resp. rate 17, height 6\' 3"  (1.905 m), weight (!) 178.3 kg, SpO2 94 %.        Intake/Output Summary (Last 24 hours) at 04/04/2021 1233 Last data filed at 04/04/2021 1000 Gross per 24 hour  Intake 2046.67 ml  Output 4625 ml  Net -2578.33 ml   Filed Weights   04/02/21 0421 04/03/21 0500 04/04/21 0409  Weight: (!) 181.4 kg (!) 178.7 kg (!) 178.3 kg    Examination: General: Elderly gentleman, does not appear to be in distress HENT: Moist oral mucosa Lungs: Decreased air movement at the bases bilaterally Cardiovascular: S1-S2 appreciated Abdomen: Soft, bowel sounds appreciated Extremities: No clubbing, edema bilaterally Neuro: Alert and oriented x3 GU: Good output  Labs/imaging that I havepersonally reviewed  (right click and "Reselect all SmartList Selections" daily)  Last chest x-ray was 5/10-ventilated lung fields, bilateral pleural effusion reviewed by  myself CBC-no leukocytosis  Resolved Hospital Problem list     Assessment & Plan:  Hypotension -Likely multifactorial -Has been in the hospital for few days and concern for infection -Has had no fever, CBC has not trended higher -No specific complaints suggesting any specific focus -Started on Levophed to support his blood pressure  -With concern for infection will check procalcitonin, repeat chest x-ray, obtain urine -Lactate ordered -Blood culture ordered -No indication for antibiotics at present  For his hypotension -Attempted to place A line unsuccessfully -Diuresis held at present -May be getting intravascularly depleted even though he still has significant third spacing-H&H higher, bicarb trending higher  Atrial fibrillation with RVR -On amiodarone, digoxin  Acute systolic heart failure Cardiomyopathy -Some medications on hold presently secondary to his relative hypotension -Plan [facility] to [facility] evaluation  Hypertension -Meds on hold  History of alcohol abuse  Concern for undiagnosed sleep apnea -Work-up and treatment as outpatient  Orders placed for transfer to [facility]  Best practice (right click and "Reselect all SmartList Selections" daily)  Diet:  Oral Pain/Anxiety/Delirium protocol (if indicated): No VAP protocol (if indicated): Not indicated DVT prophylaxis: Systemic AC GI prophylaxis: N/A Glucose control:  SSI No Central venous access:  N/A Arterial line:  N/A Foley:  N/A Mobility:  bed rest  PT consulted: N/A Last date of multidisciplinary goals of care discussion [Per primary] Code Status:  full code Disposition: ICU  Labs   CBC: Recent Labs  Lab 04/01/21 0247 04/02/21 0624 04/03/21 0601 04/04/21 0500  WBC  8.2 7.8 7.4 7.6  HGB 16.2 15.9 16.4 17.8*  HCT 50.6 49.4 51.1 54.9*  MCV 104.1* 102.9* 102.4* 100.9*  PLT 150 156 176 481    Basic Metabolic Panel: Recent Labs  Lab 03/31/21 0647 04/01/21 0247 04/02/21 0624  04/03/21 0601 04/04/21 0500  NA 137 136 136 136 134*  K 5.0 4.8 4.2 4.2 3.9  CL 95* 93* 90* 91* 85*  CO2 37* 36* 36* 39* 39*  GLUCOSE 113* 122* 109* 116* 108*  BUN 39* 37* 24* 28* 31*  CREATININE 1.44* 1.22 0.92 1.14 1.04  CALCIUM 8.7* 8.4* 8.3* 7.9* 8.5*  MG  --  2.3 2.1 2.1 1.9   GFR: Estimated Creatinine Clearance: 105.9 mL/min (by C-G formula based on SCr of 1.04 mg/dL). Recent Labs  Lab 03/25/2021 1756 04/09/2021 2040 04/01/21 0247 04/02/21 0624 04/03/21 0601 04/04/21 0500 04/04/21 1118  WBC  --   --  8.2 7.8 7.4 7.6  --   LATICACIDVEN 2.1* 1.6  --   --   --   --  1.0    Liver Function Tests: No results for input(s): AST, ALT, ALKPHOS, BILITOT, PROT, ALBUMIN in the last 168 hours. No results for input(s): LIPASE, AMYLASE in the last 168 hours. No results for input(s): AMMONIA in the last 168 hours.  ABG    Component Value Date/Time   TCO2 26 12/18/2011 1518   O2SAT 66.7 04/04/2021 1031     Coagulation Profile: No results for input(s): INR, PROTIME in the last 168 hours.  Cardiac Enzymes: No results for input(s): CKTOTAL, CKMB, CKMBINDEX, TROPONINI in the last 168 hours.  HbA1C: Hgb A1c MFr Bld  Date/Time Value Ref Range Status  02/18/2016 01:46 PM 5.8 (H) 4.8 - 5.6 % Final    Comment:             Pre-diabetes: 5.7 - 6.4          Diabetes: >6.4          Glycemic control for adults with diabetes: <7.0     CBG: No results for input(s): GLUCAP in the last 168 hours.  Review of Systems:   Denies any pain or discomfort  Past Medical History:  He,  has a past medical history of Allergy, Back pain, Cancer ([place]), Hypertension, Numbness, and Weakness of right lower extremity.   Surgical History:   Past Surgical History:  Procedure Laterality Date  . CARDIOVERSION N/A 03/29/2021   Procedure: CARDIOVERSION;  Surgeon: Freada Bergeron, MD;  Location[facility] ENDOSCOPY;  Service: Cardiovascular;  Laterality: N/A;  . COLON SURGERY    . TEE WITHOUT  CARDIOVERSION N/A 03/28/2021   Procedure: TRANSESOPHAGEAL ECHOCARDIOGRAM (TEE);  Surgeon: Freada Bergeron, MD;  Location: [facility] ENDOSCOPY;  Service: Cardiovascular;  Laterality: N/A;     Social History:   reports that he has quit smoking. He has never used smokeless tobacco. He reports current alcohol use. He reports that he does not use drugs.   Family History:  His family history includes Heart disease in his mother; Hypertension in his mother.   Allergies Allergies  Allergen Reactions  . Codeine     Upset stomach   . Sulfa Antibiotics Hives and Rash     Home Medications  Prior to Admission medications   Medication Sig Start Date End Date Taking? Authorizing Provider  albuterol (VENTOLIN HFA) 108 (90 Base) MCG/ACT inhaler Inhale 2 puffs into the lungs every 6 (six) hours as needed for wheezing or shortness of breath. 01/29/21  Yes [provider]  gabapentin (NEURONTIN) 300 MG capsule Take 2 capsules by mouth daily in the afternoon. 03/22/21  Yes [provider]  metoprolol succinate (TOPROL-XL) 50 MG 24 hr tablet Take 50 mg by mouth daily. 03/11/21  Yes [provider]  Multiple Vitamins-Minerals (MULTIVITAMINS THER. W/MINERALS) TABS Take 1 tablet by mouth daily.   Yes [provider]  temazepam (RESTORIL) 15 MG capsule Take 15 mg by mouth at bedtime. 03/07/21  Yes [provider]  testosterone cypionate (DEPOTESTOSTERONE CYPIONATE) 200 MG/ML injection Inject 200 mg into the muscle every 14 (fourteen) days. 03/16/21   [provider]     The patient is critically ill with multiple organ systems failure and requires high complexity decision making for assessment and support, frequent evaluation and titration of therapies, application of advanced monitoring technologies and extensive interpretation of multiple databases. Critical Care Time devoted to patient care services described in this note independent of APP/resident time (if  applicable)  is 32 minutes.   Sherrilyn Rist MD [facility] Personal pager: [phone] If unanswered, please page CCM On-call: [phone]

## 2021-04-04 NOTE — Progress Notes (Addendum)
PROGRESS NOTE    Jose Fitzpatrick  QBH:[id number] DOB: April 16, 1946 DOA: 04/12/2021 PCP: Tamsen Roers, MD   No chief complaint on file.  Brief Narrative:  75 year old morbidly obese male with Jose Fitzpatrick BMI of 50, hypertension, peripheral neuropathy admitted with rapid Daisuke Bailey. fib/flutter/CHF exacerbation.  He underwent TEE cardioversion 04/05/2021.  He's currently being treated for heart failure exacerbation and afib with RVR with assistance from cardiology.    Assessment & Plan:   Principal Problem:   Atrial fibrillation with RVR (HCC) Active Problems:   HTN (hypertension)   Acute CHF (congestive heart failure) (HCC)   Acute respiratory failure with hypoxia (HCC)   Elevated TSH   BMI 50.0-59.9, adult (HCC)  Addendum Persistent hypotension (SBP 80's).  He's alert and oriented.  Planning for Janaisha Tolsma line.  May need pressors, will place order for levophed if maps sustained <65.  Lactic, blood cultures.  [facility]    #1 new onset acute systolic heart failure  Acute Hypoxic Resp Failure Currently on 5 L (on RA at baseline) CXR 5/10 with interstitial pulm edema and small bilateral effusions echocardiogram 5/16 with EF 25-30% with ejection fraction 25% Appreciate cardiology c/s Suspected related to arrhythmia Continue lasix gtt.  Metolazone per cards. Entresto, sprionolactone. (holding both today with relative hypotension) Holding on SGLT2 inhibitor.   Strict I/O, daily weights - net negative 24 L   #2 Yazleemar Strassner. fib RVR/Kiara Mcdowell flutter status post cardioversion 04/12/2021  Back in fib Continue amiodarone gtt, digoxin today per cards Holding metoprolol with soft BP's Eliquis   #3 AKI  Fluctuating, improved overall - follow with continued diuresis  #4 morbid obesity-makes him more susceptible to complications. Body mass index is 49.13 kg/m.  #5 essential hypertension  Lasix, metoprolol (on hold), entresto, spironolactone  #6 history of alcohol abuse continue supportive  treatment.  #7 macrocytosis likely from alcohol abuse Normal B12, folate  #8  Possible undiagnosed sleep apnea will need outpatient work-up   DVT prophylaxis: eliquis Code Status: full  Family Communication: son at bedside Disposition:   Status is: Inpatient  Remains inpatient appropriate because:Inpatient level of care appropriate due to severity of illness   Dispo: The patient is from: Home              Anticipated d/c is to: Home              Patient currently is not medically stable to d/c.   Difficult to place patient No       Consultants:   cardiology  Procedures:  TEE IMPRESSIONS    1. Left ventricular ejection fraction, by estimation, is 25 to 30%. The  left ventricle has severely decreased function.  2. Right ventricular systolic function is moderately reduced. The right  ventricular size is normal.  3. Left atrial size was moderately dilated. No left atrial/left atrial  appendage thrombus was detected.  4. Right atrial size was moderately dilated.  5. The mitral valve is normal in structure. Mild mitral valve  regurgitation. No evidence of mitral stenosis.  6. The aortic valve is tricuspid. There is mild calcification of the  aortic valve. There is mild thickening of the aortic valve. Aortic valve  regurgitation is not visualized. Mild aortic valve sclerosis is present,  with no evidence of aortic valve   cardioversion   Antimicrobials: Anti-infectives (From admission, onward)   None         Subjective: Denies any new complaints  Objective: Vitals:   04/04/21 0700 04/04/21 0809 04/04/21  3614 04/04/21 0907  BP: (!) 97/46 (!) 103/53  (!) 89/47  Pulse: (!) 117 (!) 151  68  Resp: 19   16  Temp:   97.6 F (36.4 C)   TempSrc:   Oral   SpO2: 97%   95%  Weight:      Height:        Intake/Output Summary (Last 24 hours) at 04/04/2021 0941 Last data filed at 04/04/2021 0600 Gross per 24 hour  Intake 2072.19 ml  Output 5025 ml   Net -2952.81 ml   Filed Weights   04/02/21 0421 04/03/21 0500 04/04/21 0409  Weight: (!) 181.4 kg (!) 178.7 kg (!) 178.3 kg    Examination:  General: No acute distress. Cardiovascular: irregularly irregular, tachy Lungs: Clear to auscultation bilaterally - distant Abdomen: Soft, nontender, nondistended Neurological: Alert and oriented 3. Moves all extremities 4. Cranial nerves II through XII grossly intact. Skin: Warm and dry. No rashes or lesions. Extremities: unna boots, bilateral LE edema    Data Reviewed: I have personally reviewed following labs and imaging studies  CBC: Recent Labs  Lab 04/01/21 0247 04/02/21 0624 04/03/21 0601 04/04/21 0500  WBC 8.2 7.8 7.4 7.6  HGB 16.2 15.9 16.4 17.8*  HCT 50.6 49.4 51.1 54.9*  MCV 104.1* 102.9* 102.4* 100.9*  PLT 150 156 176 431    Basic Metabolic Panel: Recent Labs  Lab 03/31/21 0647 04/01/21 0247 04/02/21 0624 04/03/21 0601 04/04/21 0500  NA 137 136 136 136 134*  K 5.0 4.8 4.2 4.2 3.9  CL 95* 93* 90* 91* 85*  CO2 37* 36* 36* 39* 39*  GLUCOSE 113* 122* 109* 116* 108*  BUN 39* 37* 24* 28* 31*  CREATININE 1.44* 1.22 0.92 1.14 1.04  CALCIUM 8.7* 8.4* 8.3* 7.9* 8.5*  MG  --  2.3 2.1 2.1 1.9    GFR: Estimated Creatinine Clearance: 105.9 mL/min (by C-G formula based on SCr of 1.04 mg/dL).  Liver Function Tests: No results for input(s): AST, ALT, ALKPHOS, BILITOT, PROT, ALBUMIN in the last 168 hours.  CBG: No results for input(s): GLUCAP in the last 168 hours.   No results found for this or any previous visit (from the past 240 hour(s)).       Radiology Studies: No results found.      Scheduled Meds: . apixaban  5 mg Oral BID  . Chlorhexidine Gluconate Cloth  6 each Topical Daily  . digoxin  0.25 mg Intravenous Once  . [START ON 04/05/2021] digoxin  0.125 mg Oral Daily  . folic acid  1 mg Oral Daily  . gabapentin  600 mg Oral QPM  . mouth rinse  15 mL Mouth Rinse BID  . multivitamin with  minerals  1 tablet Oral Daily  . potassium chloride  40 mEq Oral BID  . sacubitril-valsartan  1 tablet Oral BID  . sodium chloride flush  10-40 mL Intracatheter Q12H  . spironolactone  25 mg Oral Daily  . testosterone cypionate  200 mg Intramuscular Q14 Days  . thiamine  100 mg Oral Daily   Or  . thiamine  100 mg Intravenous Daily  . vitamin B-12  1,000 mcg Oral Daily   Continuous Infusions: . sodium chloride 10 mL/hr at 04/02/21 2000  . amiodarone 30 mg/hr (04/04/21 0600)  . furosemide (LASIX) 200 mg in dextrose 5% 100 mL (2mg /mL) infusion 20 mg/hr (04/04/21 0750)     LOS: 11 days    Time spent: over 30 min    Fayrene Helper, MD  [facility]   To contact the attending provider between 7A-7P or the covering provider during after hours 7P-7A, please log into the web site [email or web address] and access using universal [place] password for that web site. If you do not have the password, please call the hospital operator.  04/04/2021, 9:41 AM

## 2021-04-05 DIAGNOSIS — I4891 Unspecified atrial fibrillation: Secondary | ICD-10-CM | POA: Diagnosis not present

## 2021-04-05 DIAGNOSIS — I9589 Other hypotension: Secondary | ICD-10-CM | POA: Diagnosis not present

## 2021-04-05 DIAGNOSIS — L8990 Pressure ulcer of unspecified site, unspecified stage: Secondary | ICD-10-CM | POA: Insufficient documentation

## 2021-04-05 DIAGNOSIS — I5041 Acute combined systolic (congestive) and diastolic (congestive) heart failure: Secondary | ICD-10-CM | POA: Diagnosis not present

## 2021-04-05 LAB — BASIC METABOLIC PANEL
Anion gap: 9 (ref 5–15)
BUN: 29 mg/dL — ABNORMAL HIGH (ref 8–23)
CO2: 40 mmol/L — ABNORMAL HIGH (ref 22–32)
Calcium: 8 mg/dL — ABNORMAL LOW (ref 8.9–10.3)
Chloride: 83 mmol/L — ABNORMAL LOW (ref 98–111)
Creatinine, Ser: 1.12 mg/dL (ref 0.61–1.24)
GFR, Estimated: >60 mL/min (ref >60)
Glucose, Bld: 208 mg/dL — ABNORMAL HIGH (ref 70–99)
Potassium: 4.3 mmol/L (ref 3.5–5.1)
Sodium: 132 mmol/L — ABNORMAL LOW (ref 135–145)

## 2021-04-05 LAB — CBC
HCT: 49.4 % (ref 39.0–52.0)
Hemoglobin: 16.5 g/dL (ref 13.0–17.0)
MCH: 33.1 pg (ref 26.0–34.0)
MCHC: 33.4 g/dL (ref 30.0–36.0)
MCV: 99 fL (ref 80.0–100.0)
Platelets: 136 10*3/uL — ABNORMAL LOW (ref 150–400)
RBC: 4.99 MIL/uL (ref 4.22–5.81)
RDW: 12.8 % (ref 11.5–15.5)
WBC: 8.3 10*3/uL (ref 4.0–10.5)
nRBC: 0 % (ref 0.0–0.2)

## 2021-04-05 LAB — PROCALCITONIN: Procalcitonin: <0.1 ng/mL

## 2021-04-05 LAB — PHOSPHORUS
Phosphorus: 2.8 mg/dL (ref 2.5–4.6)
Phosphorus: 3.1 mg/dL (ref 2.5–4.6)

## 2021-04-05 LAB — MAGNESIUM
Magnesium: 1.9 mg/dL (ref 1.7–2.4)
Magnesium: 2 mg/dL (ref 1.7–2.4)

## 2021-04-05 LAB — DIGOXIN LEVEL: Digoxin Level: 0.3 ng/mL — ABNORMAL LOW (ref 0.8–2.0)

## 2021-04-05 MED ORDER — BACITRACIN ZINC 500 UNIT/GM EX OINT
TOPICAL_OINTMENT | Freq: Two times a day (BID) | CUTANEOUS | Status: AC
  Administered 2021-04-09 – 2021-04-12 (×3): 1 via TOPICAL
  Filled 2021-04-05 (×2): qty 28.4

## 2021-04-05 MED ORDER — FUROSEMIDE 10 MG/ML IJ SOLN
60.0000 mg | Freq: Every day | INTRAMUSCULAR | Status: DC
  Administered 2021-04-05: 60 mg via INTRAVENOUS
  Filled 2021-04-05: qty 6

## 2021-04-05 NOTE — Progress Notes (Addendum)
NAME:  Jose Fitzpatrick, MRN:  [id number], DOB:  Mar 06, 1946, LOS: 12 ADMISSION DATE:  04/08/2021, CONSULTATION DATE: 04/04/2021 REFERRING MD: Dr. Florene Glen, CHIEF COMPLAINT: Hypotension  History of Present Illness:  75 year old with history of obesity, hypertension, peripheral neuropathy.  Admitted on 5/10 with rapid atrial fibrillation, flutter, CHF exacerbation.  Underwent cardioversion on 5/16.  PCCM consulted on 5/21 for new hypotension requiring pressors Transferred [facility] to [facility] at request of cardiology  Pertinent  Medical History   Past Medical History:  Diagnosis Date  . Allergy   . Back pain   . Cancer ([place])    colon  . Hypertension   . Numbness   . Weakness of right lower extremity     Significant Hospital Events: Including procedures, antibiotic start and stop dates in addition to other pertinent events   . 5/10 Admitted . 5/16 TEE cardioversion . 5/21 hypotension, started pressors.  Transferred to [facility] from [facility] at request of cardiology  Interim History / Subjective:  Noted to be hypotensive this morning He is on diuresis with Lasix  Objective   Blood pressure 100/68, pulse (!) 132, temperature 97.8 F (36.6 C), resp. rate 15, height 6\' 3"  (1.905 m), weight (!) 178.3 kg, SpO2 93 %.        Intake/Output Summary (Last 24 hours) at 04/05/2021 1054 Last data filed at 04/05/2021 0951 Gross per 24 hour  Intake 927.38 ml  Output 4200 ml  Net -3272.62 ml   Filed Weights   04/02/21 0421 04/03/21 0500 04/04/21 0409  Weight: (!) 181.4 kg (!) 178.7 kg (!) 178.3 kg    Examination: General: Elderly gentleman, does not appear to be in distress HENT: Moist oral mucosa Lungs: Decreased air movement at the bases bilaterally Cardiovascular: S1-S2 appreciated Abdomen: Soft, bowel sounds appreciated Extremities: No clubbing, edema bilaterally Neuro: Alert and oriented x3 GU: Good output  Labs/imaging that I havepersonally reviewed  (right click and "Reselect  all SmartList Selections" daily)   Metabolic panel is stable, lactic acid 1, procalcitonin < 0.10 CBC is stable  Resolved Hospital Problem list     Assessment & Plan:  Hypotension Likely multifactorial secondary to atrial fibrillation, diuresis No clear evidence of fevers with negative lactic acid and PCT Weaning off Levophed Stop Lasix for now Follow cultures Observe off antibiotics   Atrial fibrillation with RVR On amiodarone, digoxin, Eliquis Cardiology is following.  May need repeat cardioversion later this  History of alcohol abuse Monitor for withdrawal  Concern for undiagnosed sleep apnea -Work-up and treatment as outpatient  Peripheral neuropathy Continue Neurontin  Best practice (right click and "Reselect all SmartList Selections" daily)  Diet:  Oral Pain/Anxiety/Delirium protocol (if indicated): No VAP protocol (if indicated): Not indicated DVT prophylaxis: Systemic AC GI prophylaxis: N/A Glucose control:  SSI No Central venous access:  N/A Arterial line:  N/A Foley:  Yes, and it is no longer needed Mobility:  OOB  PT consulted: N/A Last date of multidisciplinary goals of care discussion []  Code Status:  DNR.  He has a living well and does not want intubation or CPR.  Confirmed with patient Disposition: ICU  Critical care time:   The patient is critically ill with multiple organ system failure and requires high complexity decision making for assessment and support, frequent evaluation and titration of therapies, advanced monitoring, review of radiographic studies and interpretation of complex data.   Critical Care Time devoted to patient care services, exclusive of separately billable procedures, described in this note is 46  minutes.   Marshell Garfinkel MD [place] [facility] See Amion for pager  If no response to pager , please call [phone] until 7pm After 7:00 pm call Elink  [phone] 04/05/2021, 10:54 AM

## 2021-04-05 NOTE — Progress Notes (Signed)
Family members notified the primary nurse that patient had a black phone charger with him at the previous hospital. Patient was transported to current room overnight, room was searched and charger was not found. Primary nurse contacted [facility] via phone, Sarah RN was able to check room and found the charger that belonged to the patient. Charger is unable to be brought over to [facility]. Charger will be held at the front lobby at [facility] and family agreeable to picking it up.

## 2021-04-05 NOTE — Progress Notes (Signed)
[place] OF CARE NOTE Patient: Jose Fitzpatrick MLY:[id number]   PCP: Tamsen Roers, MD DOB: [birth year]   DOA: 04-08-2021   DOS: 04/05/2021    Pt transferred from [facility] to [facility] for further cardiac work up in the setting of hypotension, CHF and A fib with RVR. Currently on IV Levophed.  D/w Dr Vaughan Browner care will be transferred to [facility] for now.  Call [facility] when pt is ready to get out of ICU.  Appreciate Cardiology and PCCM assistance.   Author: Berle Mull, MD [facility] 04/05/2021 10:00 AM   If 7PM-7AM, please contact night-coverage at [email or web address]

## 2021-04-05 NOTE — Progress Notes (Signed)
Progress Note   Subjective   Doing well today, the patient denies CP or SOB. Transferred from [facility] due to hypotension.  No new concerns  Inpatient Medications    Scheduled Meds: . apixaban  5 mg Oral BID  . Chlorhexidine Gluconate Cloth  6 each Topical Daily  . digoxin  0.125 mg Oral Daily  . folic acid  1 mg Oral Daily  . gabapentin  600 mg Oral QPM  . mouth rinse  15 mL Mouth Rinse BID  . multivitamin with minerals  1 tablet Oral Daily  . potassium chloride  40 mEq Oral BID  . sodium chloride flush  10-40 mL Intracatheter Q12H  . testosterone cypionate  200 mg Intramuscular Q14 Days  . thiamine  100 mg Oral Daily   Or  . thiamine  100 mg Intravenous Daily  . vitamin B-12  1,000 mcg Oral Daily   Continuous Infusions: . sodium chloride Stopped (04/04/21 0949)  . sodium chloride    . amiodarone 30 mg/hr (04/05/21 0800)  . norepinephrine (LEVOPHED) Adult infusion 2 mcg/min (04/05/21 0800)   PRN Meds: sodium chloride, Place/Maintain arterial line **AND** sodium chloride, docusate sodium, polyethylene glycol, sodium chloride flush   Vital Signs    Vitals:   04/05/21 0700 04/05/21 0730 04/05/21 0745 04/05/21 0800  BP:   116/64 (!) 116/102  Pulse:      Resp:  13 (!) 24 15  Temp: 97.8 F (36.6 C)     TempSrc:      SpO2:  92% 94% 91%  Weight:      Height:        Intake/Output Summary (Last 24 hours) at 04/05/2021 0820 Last data filed at 04/05/2021 0800 Gross per 24 hour  Intake 786.08 ml  Output 4000 ml  Net -3213.92 ml   Filed Weights   04/02/21 0421 04/03/21 0500 04/04/21 0409  Weight: (!) 181.4 kg (!) 178.7 kg (!) 178.3 kg    Telemetry    afib with RVR, V rates 140s - Personally Reviewed  Physical Exam   GEN- The patient is ill appearing, alert and oriented x 3 today.   Head- normocephalic, atraumatic Eyes-  Sclera clear, conjunctiva pink Ears- hearing intact Oropharynx- clear Neck- supple, Lungs-  normal work of breathing Heart- tachycardic  irregular rhythm  GI- soft  Extremities- no clubbing, cyanosis, + dependant edema MS- no significant deformity or atrophy Skin- no rash or lesion Psych- euthymic mood, full affect Neuro- strength and sensation are intact   Labs    Chemistry Recent Labs  Lab 04/02/21 0624 04/03/21 0601 04/04/21 0500  NA 136 136 134*  K 4.2 4.2 3.9  CL 90* 91* 85*  CO2 36* 39* 39*  GLUCOSE 109* 116* 108*  BUN 24* 28* 31*  CREATININE 0.92 1.14 1.04  CALCIUM 8.3* 7.9* 8.5*  GFRNONAA >60 >60 >60  ANIONGAP 10 6 10      Hematology Recent Labs  Lab 04/02/21 0624 04/03/21 0601 04/04/21 0500  WBC 7.8 7.4 7.6  RBC 4.80 4.99 5.44  HGB 15.9 16.4 17.8*  HCT 49.4 51.1 54.9*  MCV 102.9* 102.4* 100.9*  MCH 33.1 32.9 32.7  MCHC 32.2 32.1 32.4  RDW 13.2 13.2 13.1  PLT 156 176 174     Patient ID  Jose Brow A Carpenteris a 75 y.o.male with hypertension, morbid obesity, chronic venous insufficiency, possible alcohol abuse who was admitted on 04/01/2021 with shortness of breath and acute decompensated systolic heart failure. Found to be in atrial flutter as  well as afib with RVR.   Assessment & Plan    1.  Atrial fibrillation/ atrial flutter with RVR Likely the cause of his decompensation.  He should do much better in sinus.  Underwent TEE guided [facility] 04/15/2021 but unfortunately, quickly returned to afib. He has since been loaded with IV amiodarone.  I would advise repeat cardioversion tomorrow.   Continue eliquis.  Medical therapy is limited by hypotension.  If he because more unstable, bedside emergent [facility] is advised.  Add metoprolol for rate control as BP allows  2. New onset CHF Likely tachycardia mediated He has been aggressively diuresed Medical therapy is currently limited by hypotension. Reassess after sinus rhythm has been established/ maintained for several months Continue aggressive diuresis  3. Hypotension Likely multifactorial I appreciate PCCM assistance Limits medical therapy  for afib and CHF  Critical care time was exclusive of separate billable procedures and treating other patients.  Critial care time was spent personally by me (independant of midlevel providers) on the following activities: development of treatment plan with patient  as well as nursing,  evaluation of patients response to treatment, examining patient, obtaining history from patient or surrogate, ordering/ reviewing treatments/ interventions, lab studies, radiographic studies, pulse ox, and re-evaluation of patients condition.     The patient is critically ill with multiple organ systems failure and requires high complexity decision making for assessment and support, frequent evaluation and titration of therapies, application of advanced monitoring technologies and extensive interpretation of databases.   Critical care was necessary to treat or prevent immintent or life-threatening deterioration.    Total CCT spent directly with the patient today is 45 minutes  Thompson Grayer MD, [facility] 04/05/2021 8:54 AM

## 2021-04-05 NOTE — Consult Note (Signed)
[place] Nurse Consult Note: Reason for Consult: Reconsulted for bilateral ear Stage 2 (partial thickness) areas of skin loss. Wound type:pressure Pressure Injury POA: No Measurement:Per Nursing Flow sheet; Nursing has been requested to place measurements on the Flow Sheet today. Wound GNF:AOZH, moist Drainage (amount, consistency, odor) scant serous Periwound:intact, dry Dressing procedure/placement/frequency: I will provide Nursing with guidance via the orders for use of bacitracin ointment topped with xeroform gauze. This is to be topped with gauze 2x2s and changed twice daily and PRN until reepithelialized.    [place] nursing team will not follow, but will remain available to this patient, the nursing and medical teams.  Please re-consult if needed. Thanks, Maudie Flakes, MSN, RN, [place], Arther Abbott  Pager# [phone]

## 2021-04-05 NOTE — Consult Note (Deleted)
NAME:  Jose Fitzpatrick, MRN:  [id number], DOB:  [birth year], LOS: 12 ADMISSION DATE:  03/17/2021, CONSULTATION DATE: 04/04/2021 REFERRING MD: Dr. Florene Glen, CHIEF COMPLAINT: Hypotension  History of Present Illness:  75 year old with history of obesity, hypertension, peripheral neuropathy.  Admitted on 5/10 with rapid atrial fibrillation, flutter, CHF exacerbation.  Underwent cardioversion on 5/16.  PCCM consulted on 5/21 for new hypotension requiring pressors Transferred [facility] to [facility] at request of cardiology  Pertinent  Medical History   Past Medical History:  Diagnosis Date  . Allergy   . Back pain   . Cancer ([place])    colon  . Hypertension   . Numbness   . Weakness of right lower extremity     Significant Hospital Events: Including procedures, antibiotic start and stop dates in addition to other pertinent events   . 5/10 Admitted . 5/16 TEE cardioversion . 5/21 hypotension, started pressors.  Transferred to [facility] from [place] at request of cardiology  Interim History / Subjective:  Noted to be hypotensive this morning He is on diuresis with Lasix  Objective   Blood pressure 100/68, pulse (!) 132, temperature 97.8 F (36.6 C), resp. rate 15, height 6\' 3"  (1.905 m), weight (!) 178.3 kg, SpO2 93 %.        Intake/Output Summary (Last 24 hours) at 04/05/2021 1036 Last data filed at 04/05/2021 0951 Gross per 24 hour  Intake 927.38 ml  Output 4200 ml  Net -3272.62 ml   Filed Weights   04/02/21 0421 04/03/21 0500 04/04/21 0409  Weight: (!) 181.4 kg (!) 178.7 kg (!) 178.3 kg    Examination: General: Elderly gentleman, does not appear to be in distress HENT: Moist oral mucosa Lungs: Decreased air movement at the bases bilaterally Cardiovascular: S1-S2 appreciated Abdomen: Soft, bowel sounds appreciated Extremities: No clubbing, edema bilaterally Neuro: Alert and oriented x3 GU: Good output  Labs/imaging that I havepersonally reviewed  (right click and "Reselect  all SmartList Selections" daily)   Metabolic panel is stable, lactic acid 1, procalcitonin < 0.10 CBC is stable  Resolved Hospital Problem list     Assessment & Plan:  Hypotension Likely multifactorial secondary to atrial fibrillation, diuresis No clear evidence of fevers with negative lactic acid and PCT Weaning off Levophed Stop Lasix for now Follow cultures Observe off antibiotics   Atrial fibrillation with RVR On amiodarone, digoxin, Eliquis Cardiology is following.  May need repeat cardioversion later this  History of alcohol abuse Monitor for withdrawal  Concern for undiagnosed sleep apnea -Work-up and treatment as outpatient  Peripheral neuropathy Continue Neurontin  Best practice (right click and "Reselect all SmartList Selections" daily)  Diet:  Oral Pain/Anxiety/Delirium protocol (if indicated): No VAP protocol (if indicated): Not indicated DVT prophylaxis: Systemic AC GI prophylaxis: N/A Glucose control:  SSI No Central venous access:  N/A Arterial line:  N/A Foley:  Yes, and it is no longer needed Mobility:  OOB  PT consulted: N/A Last date of multidisciplinary goals of care discussion []  Code Status:  full code Disposition: ICU  Critical care time:   The patient is critically ill with multiple organ system failure and requires high complexity decision making for assessment and support, frequent evaluation and titration of therapies, advanced monitoring, review of radiographic studies and interpretation of complex data.   Critical Care Time devoted to patient care services, exclusive of separately billable procedures, described in this note is 35 minutes.   Marshell Garfinkel MD [place] [facility] See Amion for pager  If no response to pager , please call [phone] until 7pm After 7:00 pm call Elink  [phone] 04/05/2021, 10:37 AM

## 2021-04-05 NOTE — Consult Note (Signed)
[facility] Nurse Consult Note: Reason for Consult: Moisture associated skin damage in the intertriginous areas at abdomen, inguinal and back skin folds. Patient known to me for implementation of [employer]na's Boots last week on 2023/04/26.  ICD-10 CM Codes for Irritant Dermatitis L30.4  - Erythema intertrigo. Also used for abrasion of the hand, chafing of the skin, dermatitis due to sweating and friction, friction dermatitis, friction eczema, and genital/thigh intertrigo.  Wound type: MASD/ITD Pressure Injury POA: N/A Measurement:N/A Wound QPR:FFMB, moist Drainage (amount, consistency, odor) scant Periwound:moist, intact Dressing procedure/placement/frequency: I have provided Nursing with guidance for the care of the moisture lesions using our house antimicrobial wicking textile, Phil Dopp Kellie Simmering [phone]).  Prevalon Boots are reordered today per nursing request.  [place] nursing team will not follow, but will remain available to this patient, the nursing and medical teams.  Please re-consult if needed. Thanks, Maudie Flakes, MSN, RN, [place], Arther Abbott  Pager# [phone]

## 2021-04-06 ENCOUNTER — Inpatient Hospital Stay (HOSPITAL_COMMUNITY): Payer: Medicare HMO | Admitting: Anesthesiology

## 2021-04-06 ENCOUNTER — Encounter (HOSPITAL_COMMUNITY): Admission: EM | Disposition: E | Payer: Self-pay | Source: Home / Self Care | Attending: Internal Medicine

## 2021-04-06 ENCOUNTER — Encounter (HOSPITAL_COMMUNITY): Payer: Self-pay | Admitting: Pulmonary Disease

## 2021-04-06 DIAGNOSIS — I5041 Acute combined systolic (congestive) and diastolic (congestive) heart failure: Secondary | ICD-10-CM | POA: Diagnosis not present

## 2021-04-06 DIAGNOSIS — I959 Hypotension, unspecified: Secondary | ICD-10-CM | POA: Diagnosis not present

## 2021-04-06 DIAGNOSIS — I4891 Unspecified atrial fibrillation: Secondary | ICD-10-CM | POA: Diagnosis not present

## 2021-04-06 HISTORY — PX: CARDIOVERSION: SHX1299

## 2021-04-06 LAB — CBC
HCT: 51.9 % (ref 39.0–52.0)
Hemoglobin: 16.8 g/dL (ref 13.0–17.0)
MCH: 32.2 pg (ref 26.0–34.0)
MCHC: 32.4 g/dL (ref 30.0–36.0)
MCV: 99.4 fL (ref 80.0–100.0)
Platelets: 174 10*3/uL (ref 150–400)
RBC: 5.22 MIL/uL (ref 4.22–5.81)
RDW: 12.9 % (ref 11.5–15.5)
WBC: 7.3 10*3/uL (ref 4.0–10.5)
nRBC: 0 % (ref 0.0–0.2)

## 2021-04-06 LAB — BASIC METABOLIC PANEL
Anion gap: 6 (ref 5–15)
BUN: 24 mg/dL — ABNORMAL HIGH (ref 8–23)
CO2: 40 mmol/L — ABNORMAL HIGH (ref 22–32)
Calcium: 8.4 mg/dL — ABNORMAL LOW (ref 8.9–10.3)
Chloride: 86 mmol/L — ABNORMAL LOW (ref 98–111)
Creatinine, Ser: 1.11 mg/dL (ref 0.61–1.24)
GFR, Estimated: >60 mL/min (ref >60)
Glucose, Bld: 97 mg/dL (ref 70–99)
Potassium: 4.1 mmol/L (ref 3.5–5.1)
Sodium: 132 mmol/L — ABNORMAL LOW (ref 135–145)

## 2021-04-06 LAB — BRAIN NATRIURETIC PEPTIDE: B Natriuretic Peptide: 77.5 pg/mL (ref 0.0–100.0)

## 2021-04-06 LAB — PROTIME-INR
INR: 1.4 — ABNORMAL HIGH (ref 0.8–1.2)
Prothrombin Time: 16.9 seconds — ABNORMAL HIGH (ref 11.4–15.2)

## 2021-04-06 LAB — PROCALCITONIN: Procalcitonin: <0.1 ng/mL

## 2021-04-06 LAB — MAGNESIUM: Magnesium: 2.1 mg/dL (ref 1.7–2.4)

## 2021-04-06 LAB — PHOSPHORUS: Phosphorus: 3 mg/dL (ref 2.5–4.6)

## 2021-04-06 SURGERY — CARDIOVERSION
Anesthesia: General

## 2021-04-06 MED ORDER — ACETAMINOPHEN 325 MG PO TABS
650.0000 mg | ORAL_TABLET | Freq: Four times a day (QID) | ORAL | Status: DC | PRN
  Administered 2021-04-06: 650 mg via ORAL
  Filled 2021-04-06: qty 2

## 2021-04-06 MED ORDER — PROPOFOL 10 MG/ML IV BOLUS
INTRAVENOUS | Status: DC | PRN
  Administered 2021-04-06: 60 mg via INTRAVENOUS

## 2021-04-06 MED ORDER — LIDOCAINE 2% (20 MG/ML) 5 ML SYRINGE
INTRAMUSCULAR | Status: DC | PRN
  Administered 2021-04-06: 60 mg via INTRAVENOUS

## 2021-04-06 MED ORDER — TORSEMIDE 20 MG PO TABS
40.0000 mg | ORAL_TABLET | Freq: Every day | ORAL | Status: DC
  Administered 2021-04-06 – 2021-04-07 (×2): 40 mg via ORAL
  Filled 2021-04-06 (×2): qty 2

## 2021-04-06 MED ORDER — POTASSIUM CHLORIDE CRYS ER 20 MEQ PO TBCR
20.0000 meq | EXTENDED_RELEASE_TABLET | Freq: Every day | ORAL | Status: DC
  Administered 2021-04-07 – 2021-04-08 (×2): 20 meq via ORAL
  Filled 2021-04-06 (×2): qty 1

## 2021-04-06 MED ORDER — SODIUM CHLORIDE 0.9 % IV SOLN: INTRAVENOUS | Status: DC | PRN

## 2021-04-06 MED ORDER — AMIODARONE HCL 200 MG PO TABS
200.0000 mg | ORAL_TABLET | Freq: Two times a day (BID) | ORAL | Status: DC
  Administered 2021-04-06 – 2021-04-07 (×3): 200 mg via ORAL
  Filled 2021-04-06 (×3): qty 1

## 2021-04-06 NOTE — Interval H&P Note (Signed)
History and Physical Interval Note:  03/24/2021 8:36 AM  Jose Fitzpatrick  has presented today for surgery, with the diagnosis of afib.  The various methods of treatment have been discussed with the patient and family. After consideration of risks, benefits and other options for treatment, the patient has consented to  Procedure(s): CARDIOVERSION (N/A) as a surgical intervention.  The patient's history has been reviewed, patient examined, no change in status, stable for surgery.  I have reviewed the patient's chart and labs.  Questions were answered to the patient's satisfaction.     Kirk Ruths

## 2021-04-06 NOTE — Progress Notes (Addendum)
[facility] Progress Note  Patient: Jose Fitzpatrick    FGH:[id number]  DOA: 03/29/2021     Date of Service: the patient was seen and examined on 04/12/2021  Brief hospital course: Patient with past medical history of HTN, peripheral neuropathy, morbid obesity.  Presents with complaints of shortness of breath found to have A. fib with RVR as well as acute on chronic systolic and diastolic CHF. 5/10 admission.  Started on heparin drip, Cardizem drip.  Cardiology consulted.  And IV Lasix.  2D echo deferred due to tachycardia. 5/13 echocardiogram shows EF 35%.  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 5/16 underwent cardioversion, started on amiodarone.  Medication adjusted for heart failure as well. 5/17 back in A. fib.  On amiodarone drip.  5/21 persistent hypotension PCCM consulted.  A-line attempted.  Patient was transferred to [facility] for further cardiac work-up. 5/23 patient transferred out of the ICU.  Repeat DCCV performed as well.  PICC line removed. Currently plan is monitor for volume control and severity of RVR.  Assessment and Plan: new onset acute systolic heart failure Acute Hypoxic Resp Failure Currently on 5 L (on RA at baseline) CXR 5/10 with interstitial pulm edema and small bilateral effusions echocardiogram 5/16 with EF 25-30% with ejection fraction 25% Appreciate cardiology c/s Suspected related to arrhythmia Diuretics per cardiology. Entresto, sprionolactone. (holding both today with relative hypotension) Holding on SGLT2 inhibitor.   Strict I/O, daily weights - net negative 24 L   A. fib RVR/a flutter status post cardioversion 04/01/2021  Back in fib Continue amiodarone gtt, digoxin today per cards Holding metoprolol with soft BP's Eliquis   AKI Fluctuating, improved overall - follow with continued diuresis  morbid obesity Makes him more susceptible to complications.  essential hypertension  Lasix, metoprolol (on hold), entresto,  spironolactone  history of alcohol abuse  Continue supportive treatment.  macrocytosis  Likely from alcohol abuse Normal B12, folate  Possible undiagnosed sleep apnea  Will need outpatient work-up  Asymptomatic bacteriuria  Urine culture positive e coli. And e fecalis.  Currently no symptoms. No Antibiotics for now.   Body mass index is 45.94 kg/m.    Interventions:  Pressure Injury 04/05/21 Ear Left Stage 2 -  Partial thickness loss of dermis presenting as a shallow open injury with a red, pink wound bed without slough. from oxygen tubing (Active)  04/05/21 1312  Location: Ear  Location Orientation: Left  Staging: Stage 2 -  Partial thickness loss of dermis presenting as a shallow open injury with a red, pink wound bed without slough.  Wound Description (Comments): from oxygen tubing  Present on Admission: Yes     Pressure Injury 04/05/21 Ear Right Stage 2 -  Partial thickness loss of dermis presenting as a shallow open injury with a red, pink wound bed without slough. from oxygen tubing (Active)  04/05/21 1317  Location: Ear  Location Orientation: Right  Staging: Stage 2 -  Partial thickness loss of dermis presenting as a shallow open injury with a red, pink wound bed without slough.  Wound Description (Comments): from oxygen tubing  Present on Admission: Yes     Diet: Cardiac diet DVT Prophylaxis:    apixaban (ELIQUIS) tablet 5 mg    Advance goals of care discussion: DNR  Family Communication: family was present at bedside, at the time of interview.  The pt provided permission to discuss medical plan with the family. Opportunity was given to ask question and all questions were answered satisfactorily.   Disposition:  Status  is: Inpatient  Remains inpatient appropriate because:Ongoing diagnostic testing needed not appropriate for outpatient work up  Dispo: The patient is from: Home              Anticipated d/c is to: SNF              Patient currently is  not medically stable to d/c.   Difficult to place patient No  Subjective: Shortness of breath improving.  No nausea no vomiting but continues to have bilateral leg swelling.  Fatigue and tiredness also persistent.  No chest pain or chest tightness.  Physical Exam:  General: Appear in mild distress, no Rash; Oral Mucosa Clear, moist. no Abnormal Neck Mass Or lumps, Conjunctiva normal  Cardiovascular: S1 and S2 Present, no Murmur, Respiratory: good respiratory effort, Bilateral Air entry present and CTA, no Crackles, no wheezes Abdomen: Bowel Sound present, Soft and no tenderness Extremities: Bilateral chronic appearing lymphedema Neurology: alert and oriented to time, place, and person affect appropriate. no new focal deficit Gait not checked due to patient safety concerns  Vitals:   04/12/21 1049 04-12-21 1201 04/12/21 1459 2021/04/12 2010  BP:  125/62 115/64 123/67  Pulse: (!) 140 88 96 98  Resp:  15 18 13   Temp:  (!) 97.5 F (36.4 C) (!) 97.5 F (36.4 C) 97.6 F (36.4 C)  TempSrc:  Oral Oral Oral  SpO2:  95% 93% 93%  Weight:      Height:        Intake/Output Summary (Last 24 hours) at 04/12/2021 2012 Last data filed at 04-12-21 1700 Gross per 24 hour  Intake 845.54 ml  Output 1300 ml  Net -454.46 ml   Filed Weights   04/05/21 1144 2021-04-12 0500 04-12-2021 0839  Weight: (!) 166.7 kg (!) 166.7 kg (!) 166.7 kg    Data Reviewed: I have personally reviewed and interpreted daily labs, tele strips, imaging. I reviewed all nursing notes, pharmacy notes, vitals, pertinent old records I have discussed plan of care as described above with RN and patient/family.  CBC: Recent Labs  Lab 04/02/21 0624 04/03/21 0601 04/04/21 0500 04/05/21 0355 12-Apr-2021 0302  WBC 7.8 7.4 7.6 8.3 7.3  HGB 15.9 16.4 17.8* 16.5 16.8  HCT 49.4 51.1 54.9* 49.4 51.9  MCV 102.9* 102.4* 100.9* 99.0 99.4  PLT 156 176 174 136* 767   Basic Metabolic Panel: Recent Labs  Lab 04/02/21 0624  04/03/21 0601 04/04/21 0500 04/05/21 0355 04/05/21 1509 04-12-21 0302  NA 136 136 134*  --  132* 132*  K 4.2 4.2 3.9  --  4.3 4.1  CL 90* 91* 85*  --  83* 86*  CO2 36* 39* 39*  --  40* 40*  GLUCOSE 109* 116* 108*  --  208* 97  BUN 24* 28* 31*  --  29* 24*  CREATININE 0.92 1.14 1.04  --  1.12 1.11  CALCIUM 8.3* 7.9* 8.5*  --  8.0* 8.4*  MG 2.1 2.1 1.9 1.9 2.0 2.1  PHOS  --   --   --  2.8 3.1 3.0    Studies: No results found.  Scheduled Meds: . amiodarone  200 mg Oral BID  . apixaban  5 mg Oral BID  . bacitracin   Topical BID  . Chlorhexidine Gluconate Cloth  6 each Topical Daily  . folic acid  1 mg Oral Daily  . gabapentin  600 mg Oral QPM  . mouth rinse  15 mL Mouth Rinse BID  . multivitamin with minerals  1 tablet Oral Daily  . [START ON 04/07/2021] potassium chloride  20 mEq Oral Daily  . sodium chloride flush  10-40 mL Intracatheter Q12H  . testosterone cypionate  200 mg Intramuscular Q14 Days  . thiamine  100 mg Oral Daily   Or  . thiamine  100 mg Intravenous Daily  . torsemide  40 mg Oral Daily  . vitamin B-12  1,000 mcg Oral Daily   Continuous Infusions: . sodium chloride Stopped (04/04/21 0949)  . sodium chloride     PRN Meds: sodium chloride, Place/Maintain arterial line **AND** sodium chloride, acetaminophen, docusate sodium, polyethylene glycol, sodium chloride flush  Time spent: 35 minutes  Author: Berle Mull, MD [facility] 04/01/2021 8:12 PM  To reach On-call, see care teams to locate the attending and reach out via [email or web address]. Between 7PM-7AM, please contact night-coverage If you still have difficulty reaching the attending provider, please page the [facility] (Director on Call) for [facility] on amion for assistance.

## 2021-04-06 NOTE — Progress Notes (Signed)
Physical Therapy Treatment Patient Details Name: Jose Fitzpatrick MRN: [id number] DOB: 11-17-1945 Today's Date: 03/27/2021    History of Present Illness 75 yo admitted 5/10 to [facility] with AFIb and CHF. Pt s/p cardioversion 5/16. 5/21 pt with hypotension and transfer to [facility]. PMHx: HTN, peripheral neuropathy, venous insufficiency    PT Comments    Pt pleasant with daughter and granddaughter present and eager to try to get stronger. Afib throughout with HR 130-150 at rest without change with increased activity. Pt educated for seated position for bil LE HEP and need for progression to return home. Pt does not want to go to SNF but does not have 24hr assist for more than a few days at home as family works and will need to be able to function at [facility] level or better to safely consider option of home. Frequency increased to help pt meet goals. Will continue to follow.   Hr 130-150 SpO2 90-97% on 2L BP 100/68 (80)    Follow Up Recommendations  Supervision for mobility/OOB;SNF;CIR (pending progression)     Equipment Recommendations  Other (comment) (TBD)    Recommendations for Other Services       Precautions / Restrictions Precautions Precautions: Fall;Other (comment) Precaution Comments: Afib with HR 130-150 at rest Restrictions Weight Bearing Restrictions: No    Mobility  Bed Mobility   Bed Mobility: Supine to Sit;Sit to Supine     Supine to sit: Max assist Sit to supine: Max assist   General bed mobility comments: utilized foot egress function of bed to achieve sitting and return to supine with max +2 assist to slide fully to [facility] in supine    Transfers Overall transfer level: Needs assistance   Transfers: Sit to/from Stand Sit to Stand: Mod assist;+2 physical assistance         General transfer comment: mod +2 assist to rise from EOB in foot egress x 3 trials with assist of pad at sacrum, cues for sequence and hand placement. Final trial pt able to fully rise  and shift hands to therapist arms for upright posture able to maintain grossly 25 sec max  Ambulation/Gait             General Gait Details: not yet able   Stairs             Wheelchair Mobility    Modified Rankin (Stroke Patients Only)       Balance                                            Cognition Arousal/Alertness: Awake/alert Behavior During Therapy: WFL for tasks assessed/performed Overall Cognitive Status: Within Functional Limits for tasks assessed                                        Exercises General Exercises - Lower Extremity Long Arc Quad: AROM;Both;Seated;15 reps Hip Flexion/Marching: AROM;Both;Seated;15 reps    General Comments        Pertinent Vitals/Pain Pain Assessment: No/denies pain    Home Living                      Prior Function            PT Goals (current goals can now be found in the care  plan section) Progress towards PT goals: Progressing toward goals    Frequency    Min 3X/week      PT Plan Discharge plan needs to be updated    Co-evaluation              AM-PAC PT "6 Clicks" Mobility   Outcome Measure  Help needed turning from your back to your side while in a flat bed without using bedrails?: Total Help needed moving from lying on your back to sitting on the side of a flat bed without using bedrails?: Total Help needed moving to and from a bed to a chair (including a wheelchair)?: Total Help needed standing up from a chair using your arms (e.g., wheelchair or bedside chair)?: Total Help needed to walk in hospital room?: Total Help needed climbing 3-5 steps with a railing? : Total 6 Click Score: 6    End of Session Equipment Utilized During Treatment: Oxygen Activity Tolerance: Patient tolerated treatment well Patient left: in bed;with call bell/phone within reach;with family/visitor present Nurse Communication: Mobility status;Need for lift  equipment PT Visit Diagnosis: Difficulty in walking, not elsewhere classified (R26.2);Muscle weakness (generalized) (M62.81);Other abnormalities of gait and mobility (R26.89)     Time: 8416-6063 PT Time Calculation (min) (ACUTE ONLY): 28 min  Charges:  $Therapeutic Activity: 23-37 mins                     Jyoti Harju P, PT Acute Rehabilitation Services Pager: [phone] Office: [place] Deol[facility] Frid 04/07/2021, 10:58 AM

## 2021-04-06 NOTE — Progress Notes (Signed)
Progress Note   Subjective   Net negative 900cc yesterday on IV lasix 60mg  x1, net negative 29L on admission. Cr stable at 1.1.  Reports dyspnea improved.  Denies any chest pain.  Inpatient Medications    Scheduled Meds: . apixaban  5 mg Oral BID  . bacitracin   Topical BID  . Chlorhexidine Gluconate Cloth  6 each Topical Daily  . digoxin  0.125 mg Oral Daily  . folic acid  1 mg Oral Daily  . gabapentin  600 mg Oral QPM  . mouth rinse  15 mL Mouth Rinse BID  . multivitamin with minerals  1 tablet Oral Daily  . potassium chloride  40 mEq Oral BID  . sodium chloride flush  10-40 mL Intracatheter Q12H  . testosterone cypionate  200 mg Intramuscular Q14 Days  . thiamine  100 mg Oral Daily   Or  . thiamine  100 mg Intravenous Daily  . vitamin B-12  1,000 mcg Oral Daily   Continuous Infusions: . sodium chloride Stopped (04/04/21 0949)  . sodium chloride    . amiodarone 30 mg/hr (04/02/2021 0700)   PRN Meds: sodium chloride, Place/Maintain arterial line **AND** sodium chloride, docusate sodium, polyethylene glycol, sodium chloride flush   Vital Signs    Vitals:   04/08/2021 0400 04/14/2021 0500 03/26/2021 0600 04/12/2021 0726  BP: 99/72 93/71 129/64   Pulse:      Resp: 18 17 (!) 25   Temp:    97.9 F (36.6 C)  TempSrc:    Oral  SpO2: 95% 93% 95%   Weight:  (!) 166.7 kg    Height:        Intake/Output Summary (Last 24 hours) at 03/23/2021 0747 Last data filed at 03/15/2021 0700 Gross per 24 hour  Intake 1738.83 ml  Output 2550 ml  Net -811.17 ml   Filed Weights   04/04/21 0409 04/05/21 1144 03/18/2021 0500  Weight: (!) 178.3 kg (!) 166.7 kg (!) 166.7 kg    Telemetry    afib with RVR, V rates 130-140s - Personally Reviewed  Physical Exam  GEN: in no acute distress HEENT: normal Neck: no JVD appreciated Cardiac: irregular, tachycardic, no murmurs Respiratory:  clear to auscultation bilaterally GI: soft, nontender, nondistended MS: venous stasis changes, no pitting  edema Skin: warm and dry, no rash Neuro:  Alert and Oriented x 3 Psych: normal affect    Labs    Chemistry Recent Labs  Lab 04/04/21 0500 04/05/21 1509 03/22/2021 0302  NA 134* 132* 132*  K 3.9 4.3 4.1  CL 85* 83* 86*  CO2 39* 40* 40*  GLUCOSE 108* 208* 97  BUN 31* 29* 24*  CREATININE 1.04 1.12 1.11  CALCIUM 8.5* 8.0* 8.4*  GFRNONAA >60 >60 >60  ANIONGAP 10 9 6      Hematology Recent Labs  Lab 04/03/21 0601 04/04/21 0500 03/16/2021 0302  WBC 7.4 7.6 7.3  RBC 4.99 5.44 5.22  HGB 16.4 17.8* 16.8  HCT 51.1 54.9* 51.9  MCV 102.4* 100.9* 99.4  MCH 32.9 32.7 32.2  MCHC 32.1 32.4 32.4  RDW 13.2 13.1 12.9  PLT 176 174 174     Patient ID  Jose Brow A Carpenteris a 75 y.o.malewith hypertension, morbid obesity, chronic venous insufficiency, possible alcohol abuse who was admitted on 21-Apr-2021 with shortness of breath and acute decompensated systolic heart failure. Found to be in atrial flutter as well as afib with RVR.   Assessment & Plan    Atrial fibrillation/ atrial flutter with RVR:  suspected cause of his decompensation.  Underwent TEE guided [facility] 03/21/2021 but quickly returned to afib. He has since been loaded with IV amiodarone.  -Plan repeat DCCV today -Continue eliquis.   -Continue amiodarone gtt, can convert to PO after cardioversion -Continue digoxin for now  Hypotension: required transfer to ICU and pressor support on 5/20. No evidence of infection, procalcition <0.1.  Lactate 1.0.  Co-ox on 5/20 67%, not consistent with cardiogenic shock.  ?Overdiuresis, check CVP.  Likely heart failure meds and AF with RVR contributing, holding HF meds and planning DCCV for AF as above.  BP has improved, now normotensive off pressors.  New onset CHF: EF 25-30%, new diagnosis.  Likely tachycardia mediated -He has been aggressively diuresed, currently holding due to soft BP.  Check CVP -GDMT is currently limited by hypotension. -Reassess EF after sinus rhythm has been established/  maintained for several months   [name], MD

## 2021-04-06 NOTE — Anesthesia Procedure Notes (Signed)
Procedure Name: General with mask airway Date/Time: 2021-04-30 9:01 AM Performed by: Imagene Riches, CRNA Pre-anesthesia Checklist: Patient identified, Emergency Drugs available, Suction available, Patient being monitored and Timeout performed Patient Re-evaluated:Patient Re-evaluated prior to induction Oxygen Delivery Method: Ambu bag Preoxygenation: Pre-oxygenation with 100% oxygen Induction Type: IV induction

## 2021-04-06 NOTE — H&P (View-Only) (Signed)
Progress Note   Subjective   Net negative 900cc yesterday on IV lasix 60mg  x1, net negative 29L on admission. Cr stable at 1.1.  Reports dyspnea improved.  Denies any chest pain.  Inpatient Medications    Scheduled Meds: . apixaban  5 mg Oral BID  . bacitracin   Topical BID  . Chlorhexidine Gluconate Cloth  6 each Topical Daily  . digoxin  0.125 mg Oral Daily  . folic acid  1 mg Oral Daily  . gabapentin  600 mg Oral QPM  . mouth rinse  15 mL Mouth Rinse BID  . multivitamin with minerals  1 tablet Oral Daily  . potassium chloride  40 mEq Oral BID  . sodium chloride flush  10-40 mL Intracatheter Q12H  . testosterone cypionate  200 mg Intramuscular Q14 Days  . thiamine  100 mg Oral Daily   Or  . thiamine  100 mg Intravenous Daily  . vitamin B-12  1,000 mcg Oral Daily   Continuous Infusions: . sodium chloride Stopped (04/04/21 0949)  . sodium chloride    . amiodarone 30 mg/hr (05/03/21 0700)   PRN Meds: sodium chloride, Place/Maintain arterial line **AND** sodium chloride, docusate sodium, polyethylene glycol, sodium chloride flush   Vital Signs    Vitals:   May 03, 2021 0400 05/03/21 0500 03-May-2021 0600 May 03, 2021 0726  BP: 99/72 93/71 129/64   Pulse:      Resp: 18 17 (!) 25   Temp:    97.9 F (36.6 C)  TempSrc:    Oral  SpO2: 95% 93% 95%   Weight:  (!) 166.7 kg    Height:        Intake/Output Summary (Last 24 hours) at 05-03-21 0747 Last data filed at 03-May-2021 0700 Gross per 24 hour  Intake 1738.83 ml  Output 2550 ml  Net -811.17 ml   Filed Weights   04/04/21 0409 04/05/21 1144 05-03-2021 0500  Weight: (!) 178.3 kg (!) 166.7 kg (!) 166.7 kg    Telemetry    afib with RVR, V rates 130-140s - Personally Reviewed  Physical Exam  GEN: in no acute distress HEENT: normal Neck: no JVD appreciated Cardiac: irregular, tachycardic, no murmurs Respiratory:  clear to auscultation bilaterally GI: soft, nontender, nondistended MS: venous stasis changes, no pitting  edema Skin: warm and dry, no rash Neuro:  Alert and Oriented x 3 Psych: normal affect    Labs    Chemistry Recent Labs  Lab 04/04/21 0500 04/05/21 1509 05/03/2021 0302  NA 134* 132* 132*  K 3.9 4.3 4.1  CL 85* 83* 86*  CO2 39* 40* 40*  GLUCOSE 108* 208* 97  BUN 31* 29* 24*  CREATININE 1.04 1.12 1.11  CALCIUM 8.5* 8.0* 8.4*  GFRNONAA >60 >60 >60  ANIONGAP 10 9 6      Hematology Recent Labs  Lab 04/03/21 0601 04/04/21 0500 2021/05/03 0302  WBC 7.4 7.6 7.3  RBC 4.99 5.44 5.22  HGB 16.4 17.8* 16.8  HCT 51.1 54.9* 51.9  MCV 102.4* 100.9* 99.4  MCH 32.9 32.7 32.2  MCHC 32.1 32.4 32.4  RDW 13.2 13.1 12.9  PLT 176 174 174     Patient ID  Jose Brow A Carpenteris a 75 y.o.malewith hypertension, morbid obesity, chronic venous insufficiency, possible alcohol abuse who was admitted on 03/30/2021 with shortness of breath and acute decompensated systolic heart failure. Found to be in atrial flutter as well as afib with RVR.   Assessment & Plan    Atrial fibrillation/ atrial flutter with RVR:  suspected cause of his decompensation.  Underwent TEE guided [facility] 03/21/2021 but quickly returned to afib. He has since been loaded with IV amiodarone.  -Plan repeat DCCV today -Continue eliquis.   -Continue amiodarone gtt, can convert to PO after cardioversion -Continue digoxin for now  Hypotension: required transfer to ICU and pressor support on 5/20. No evidence of infection, procalcition <0.1.  Lactate 1.0.  Co-ox on 5/20 67%, not consistent with cardiogenic shock.  ?Overdiuresis, check CVP.  Likely heart failure meds and AF with RVR contributing, holding HF meds and planning DCCV for AF as above.  BP has improved, now normotensive off pressors.  New onset CHF: EF 25-30%, new diagnosis.  Likely tachycardia mediated -He has been aggressively diuresed, currently holding due to soft BP.  Check CVP -GDMT is currently limited by hypotension. -Reassess EF after sinus rhythm has been established/  maintained for several months   [name], MD

## 2021-04-06 NOTE — Transfer of Care (Signed)
Immediate Anesthesia Transfer of Care Note  Patient: Jose Fitzpatrick  Procedure(s) Performed: CARDIOVERSION (N/A )  Patient Location: Endoscopy Unit  Anesthesia Type:General  Level of Consciousness: awake and drowsy  Airway & Oxygen Therapy: Patient Spontanous Breathing and Patient connected to nasal cannula oxygen  Post-op Assessment: Report given to RN and Post -op Vital signs reviewed and stable  Post vital signs: Reviewed and stable  Last Vitals:  Vitals Value Taken Time  BP    Temp    Pulse    Resp    SpO2      Last Pain:  Vitals:   04/07/2021 0839  TempSrc: Oral  PainSc: 0-No pain      Patients Stated Pain Goal: 0 (81/82/99 3716)  Complications: No complications documented.

## 2021-04-06 NOTE — Anesthesia Preprocedure Evaluation (Signed)
Anesthesia Evaluation  Patient identified by MRN, date of birth, ID band Patient awake    Reviewed: Allergy & Precautions, H&P , NPO status , Patient's Chart, lab work & pertinent test results  Airway Mallampati: II   Neck ROM: full    Dental   Pulmonary former smoker,    breath sounds clear to auscultation       Cardiovascular hypertension, +CHF  + dysrhythmias Atrial Fibrillation  Rhythm:irregular Rate:Normal     Neuro/Psych  Neuromuscular disease    GI/Hepatic   Endo/Other    Renal/GU      Musculoskeletal   Abdominal   Peds  Hematology   Anesthesia Other Findings   Reproductive/Obstetrics                             Anesthesia Physical Anesthesia Plan  ASA: III  Anesthesia Plan: General   Post-op Pain Management:    Induction: Intravenous  PONV Risk Score and Plan: 2 and Propofol infusion and Treatment may vary due to age or medical condition  Airway Management Planned: Mask  Additional Equipment:   Intra-op Plan:   Post-operative Plan:   Informed Consent: I have reviewed the patients History and Physical, chart, labs and discussed the procedure including the risks, benefits and alternatives for the proposed anesthesia with the patient or authorized representative who has indicated his/her understanding and acceptance.     Dental advisory given  Plan Discussed with: CRNA, Anesthesiologist and Surgeon  Anesthesia Plan Comments:         Anesthesia Quick Evaluation

## 2021-04-06 NOTE — Procedures (Signed)
Electrical Cardioversion Procedure Note [facility]AC[facility] STUDY [id number] 1945/12/22  Procedure: Electrical Cardioversion Indications:  Atrial Flutter  Procedure Details Consent: Risks of procedure as well as the alternatives and risks of each were explained to the (patient/caregiver).  Consent for procedure obtained. Time Out: Verified patient identification, verified procedure, site/side was marked, verified correct patient position, special equipment/implants available, medications/allergies/relevent history reviewed, required imaging and test results available.  Performed  Patient placed on cardiac monitor, pulse oximetry, supplemental oxygen as necessary.  Sedation given: Pt sedated by anesthesia with lidocaine 60 mg and diprovan 60 mg IV. Pacer pads placed anterior and posterior chest.  Cardioverted 1 time(s).  Cardioverted at [place].  Evaluation Findings: Post procedure EKG shows: NSR Complications: None Patient did tolerate procedure well.   Kirk Ruths 04/08/2021, 8:38 AM

## 2021-04-07 ENCOUNTER — Inpatient Hospital Stay (HOSPITAL_COMMUNITY): Payer: Medicare HMO

## 2021-04-07 ENCOUNTER — Encounter (HOSPITAL_COMMUNITY): Payer: Self-pay | Admitting: Cardiology

## 2021-04-07 DIAGNOSIS — I4891 Unspecified atrial fibrillation: Secondary | ICD-10-CM | POA: Diagnosis not present

## 2021-04-07 DIAGNOSIS — I5041 Acute combined systolic (congestive) and diastolic (congestive) heart failure: Secondary | ICD-10-CM | POA: Diagnosis not present

## 2021-04-07 LAB — BASIC METABOLIC PANEL
Anion gap: 8 (ref 5–15)
BUN: 20 mg/dL (ref 8–23)
CO2: 42 mmol/L — ABNORMAL HIGH (ref 22–32)
Calcium: 8.6 mg/dL — ABNORMAL LOW (ref 8.9–10.3)
Chloride: 86 mmol/L — ABNORMAL LOW (ref 98–111)
Creatinine, Ser: 1.15 mg/dL (ref 0.61–1.24)
GFR, Estimated: >60 mL/min (ref >60)
Glucose, Bld: 105 mg/dL — ABNORMAL HIGH (ref 70–99)
Potassium: 4.5 mmol/L (ref 3.5–5.1)
Sodium: 136 mmol/L (ref 135–145)

## 2021-04-07 LAB — URINE CULTURE: Culture: >=100000 — AB

## 2021-04-07 MED ORDER — AMIODARONE HCL IN DEXTROSE 360-4.14 MG/200ML-% IV SOLN: 30.0000 mg/h | INTRAVENOUS | Status: DC

## 2021-04-07 MED ORDER — AMIODARONE HCL IN DEXTROSE 360-4.14 MG/200ML-% IV SOLN
60.0000 mg/h | INTRAVENOUS | Status: AC
  Administered 2021-04-07: 60 mg/h via INTRAVENOUS
  Filled 2021-04-07 (×2): qty 200

## 2021-04-07 MED ORDER — LOSARTAN POTASSIUM 25 MG PO TABS
25.0000 mg | ORAL_TABLET | Freq: Every day | ORAL | Status: DC
  Administered 2021-04-07: 25 mg via ORAL
  Filled 2021-04-07: qty 1

## 2021-04-07 MED ORDER — METOPROLOL TARTRATE 25 MG PO TABS
25.0000 mg | ORAL_TABLET | Freq: Two times a day (BID) | ORAL | Status: DC
  Administered 2021-04-07: 25 mg via ORAL
  Filled 2021-04-07: qty 1

## 2021-04-07 MED ORDER — AMIODARONE LOAD VIA INFUSION
150.0000 mg | Freq: Once | INTRAVENOUS | Status: AC
  Administered 2021-04-07: 150 mg via INTRAVENOUS
  Filled 2021-04-07: qty 83.34

## 2021-04-07 MED ORDER — FUROSEMIDE 10 MG/ML IJ SOLN
80.0000 mg | Freq: Two times a day (BID) | INTRAMUSCULAR | Status: DC
  Administered 2021-04-07 – 2021-04-08 (×3): 80 mg via INTRAVENOUS
  Filled 2021-04-07 (×3): qty 8

## 2021-04-07 NOTE — Progress Notes (Signed)
OT Cancellation Note  Patient Details Name: Jose Fitzpatrick MRN: [id number] DOB: February 07, 1946   Cancelled Treatment:    Reason Eval/Treat Not Completed: Medical issues which prohibited therapy. HR in 120s at rest. OT will follow up next available time as appropriate  Britt Bottom 04/07/2021, 2:09 PM

## 2021-04-07 NOTE — Progress Notes (Signed)
[facility] Progress Note  Patient: Jose Fitzpatrick    QZR:[id number]  DOA: 04/07/2021     Date of Service: the patient was seen and examined on 04/07/2021  Brief hospital course: Patient with past medical history of HTN, peripheral neuropathy, morbid obesity.  Presents with complaints of shortness of breath found to have A. fib with RVR as well as acute on chronic systolic and diastolic CHF. 5/10 admission.  Started on heparin drip, Cardizem drip.  Cardiology consulted.  And IV Lasix.  2D echo deferred due to tachycardia. 5/13 echocardiogram shows EF 35%.  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 5/16 underwent cardioversion, started on amiodarone.  Medication adjusted for heart failure as well. 5/17 back in A. fib.  On amiodarone drip.  5/21 persistent hypotension PCCM consulted.  A-line attempted.  Patient was transferred to [facility] for further cardiac work-up. 5/23 patient transferred out of the ICU.  Repeat DCCV performed as well.  PICC line removed. Currently plan is monitor for volume and blood pressure management  Assessment and Plan: new onset acute systolic heart failure Acute Hypoxic Resp Failure Currently on 5 L (on RA at baseline) CXR 5/10 with interstitial pulm edema and small bilateral effusions echocardiogram 5/16 with EF 25-30% with ejection fraction 25% Appreciate cardiology c/s Suspected related to arrhythmia Diuretics per cardiology. Entresto, sprionolactone. (holding both today with relative hypotension) Holding on SGLT2 inhibitor.   Strict I/O, daily weights - net negative 28 L  A. fib RVR/a flutter status post cardioversion 03/28/2021  Back in fib Continue amiodarone gtt, digoxin today per cards Holding metoprolol with soft BP's Eliquis   AKI Fluctuating, improved overall - follow with continued diuresis  morbid obesity Makes him more susceptible to complications.  essential hypertension  Lasix, metoprolol (on hold), entresto,  spironolactone  history of alcohol abuse  Continue supportive treatment.  macrocytosis  Likely from alcohol abuse Normal B12, folate  Possible undiagnosed sleep apnea  Will need outpatient work-up  Asymptomatic bacteriuria  Urine culture positive ESBL e coli. And e fecalis.  Currently no symptoms. No Antibiotics for now.   Body mass index is 47.34 kg/m.    Interventions:  Pressure Injury 04/05/21 Ear Left Stage 2 -  Partial thickness loss of dermis presenting as a shallow open injury with a red, pink wound bed without slough. from oxygen tubing (Active)  04/05/21 1312  Location: Ear  Location Orientation: Left  Staging: Stage 2 -  Partial thickness loss of dermis presenting as a shallow open injury with a red, pink wound bed without slough.  Wound Description (Comments): from oxygen tubing  Present on Admission: Yes     Pressure Injury 04/05/21 Ear Right Stage 2 -  Partial thickness loss of dermis presenting as a shallow open injury with a red, pink wound bed without slough. from oxygen tubing (Active)  04/05/21 1317  Location: Ear  Location Orientation: Right  Staging: Stage 2 -  Partial thickness loss of dermis presenting as a shallow open injury with a red, pink wound bed without slough.  Wound Description (Comments): from oxygen tubing  Present on Admission: Yes     Diet: Cardiac diet DVT Prophylaxis:    apixaban (ELIQUIS) tablet 5 mg    Advance goals of care discussion: DNR  Family Communication: no family was present at bedside, at the time of interview.   Disposition:  Status is: Inpatient  Remains inpatient appropriate because:Ongoing diagnostic testing needed not appropriate for outpatient work up  Dispo: The patient is from: Home  Anticipated d/c is to: SNF              Patient currently is not medically stable to d/c.   Difficult to place patient No  Subjective: No nausea no vomiting.  No fever no chills.  Continues to have shortness  of breath but improving.  Physical Exam:  General: Appear in mild distress, no Rash; Oral Mucosa Clear, moist. no Abnormal Neck Mass Or lumps, Conjunctiva normal  Cardiovascular: S1 and S2 Present, no Murmur, Respiratory: good respiratory effort, Bilateral Air entry present and CTA, no Crackles, no wheezes Abdomen: Bowel Sound present, Soft and no tenderness Extremities: Bilateral chronic venous stasis Pedal edema Neurology: alert and oriented to time, place, and person affect appropriate. no new focal deficit Gait not checked due to patient safety concerns   Vitals:   04/07/21 1330 04/07/21 1734 04/07/21 1744 04/07/21 1957  BP: 117/65 106/64 100/64 124/63  Pulse: (!) 126   (!) 124  Resp: 18   20  Temp: 98.2 F (36.8 C)   98.4 F (36.9 C)  TempSrc: Oral   Oral  SpO2: 94%   93%  Weight:      Height:        Intake/Output Summary (Last 24 hours) at 04/07/2021 2008 Last data filed at 04/07/2021 1000 Gross per 24 hour  Intake 330 ml  Output 750 ml  Net -420 ml   Filed Weights   03/19/2021 0500 03/31/2021 0839 04/07/21 0500  Weight: (!) 166.7 kg (!) 166.7 kg (!) 171.8 kg    Data Reviewed: I have personally reviewed and interpreted daily labs, tele strips, imaging. I reviewed all nursing notes, pharmacy notes, vitals, pertinent old records I have discussed plan of care as described above with RN and patient/family.  CBC: Recent Labs  Lab 04/02/21 0624 04/03/21 0601 04/04/21 0500 04/05/21 0355 04/10/2021 0302  WBC 7.8 7.4 7.6 8.3 7.3  HGB 15.9 16.4 17.8* 16.5 16.8  HCT 49.4 51.1 54.9* 49.4 51.9  MCV 102.9* 102.4* 100.9* 99.0 99.4  PLT 156 176 174 136* 970   Basic Metabolic Panel: Recent Labs  Lab 04/03/21 0601 04/04/21 0500 04/05/21 0355 04/05/21 1509 03/16/2021 0302 04/07/21 0213  NA 136 134*  --  132* 132* 136  K 4.2 3.9  --  4.3 4.1 4.5  CL 91* 85*  --  83* 86* 86*  CO2 39* 39*  --  40* 40* 42*  GLUCOSE 116* 108*  --  208* 97 105*  BUN 28* 31*  --  29* 24* 20   CREATININE 1.14 1.04  --  1.12 1.11 1.15  CALCIUM 7.9* 8.5*  --  8.0* 8.4* 8.6*  MG 2.1 1.9 1.9 2.0 2.1  --   PHOS  --   --  2.8 3.1 3.0  --     Studies: DG CHEST PORT 1 VIEW  Result Date: 04/07/2021 CLINICAL DATA:  Dyspnea. EXAM: PORTABLE CHEST 1 VIEW COMPARISON:  Apr 04, 2021. FINDINGS: Stable cardiomediastinal silhouette. No pneumothorax is noted. Bibasilar interstitial densities are noted consistent with subsegmental atelectasis or edema with associated small pleural effusions. Bony thorax is unremarkable. IMPRESSION: Bibasilar subsegmental atelectasis or edema is noted with associated small pleural effusions. Electronically Signed   By: Marijo Conception M.D.   On: 04/07/2021 12:09    Scheduled Meds: . apixaban  5 mg Oral BID  . bacitracin   Topical BID  . folic acid  1 mg Oral Daily  . furosemide  80 mg Intravenous BID  .  gabapentin  600 mg Oral QPM  . losartan  25 mg Oral Daily  . mouth rinse  15 mL Mouth Rinse BID  . multivitamin with minerals  1 tablet Oral Daily  . potassium chloride  20 mEq Oral Daily  . testosterone cypionate  200 mg Intramuscular Q14 Days  . thiamine  100 mg Oral Daily   Or  . thiamine  100 mg Intravenous Daily  . vitamin B-12  1,000 mcg Oral Daily   Continuous Infusions: . sodium chloride Stopped (04/04/21 0949)  . sodium chloride    . amiodarone 60 mg/hr (04/07/21 1743)   Followed by  . amiodarone     PRN Meds: sodium chloride, Place/Maintain arterial line **AND** sodium chloride, acetaminophen, docusate sodium, polyethylene glycol  Time spent: 35 minutes  Author: Berle Mull, MD [facility] 04/07/2021 8:08 PM  To reach On-call, see care teams to locate the attending and reach out via [email or web address]. Between 7PM-7AM, please contact night-coverage If you still have difficulty reaching the attending provider, please page the [facility] (Director on Call) for [facility] on amion for assistance.

## 2021-04-07 NOTE — Anesthesia Postprocedure Evaluation (Signed)
Anesthesia Post Note  Patient: Margrett Rud  Procedure(s) Performed: CARDIOVERSION (N/A )     Patient location during evaluation: Endoscopy Anesthesia Type: General Level of consciousness: awake and alert Pain management: pain level controlled Vital Signs Assessment: post-procedure vital signs reviewed and stable Respiratory status: spontaneous breathing, nonlabored ventilation, respiratory function stable and patient connected to nasal cannula oxygen Cardiovascular status: blood pressure returned to baseline and stable Postop Assessment: no apparent nausea or vomiting Anesthetic complications: no   No complications documented.  Last Vitals:  Vitals:   04/07/21 0500 04/07/21 0804  BP: 130/62 120/67  Pulse: 98 (!) 103  Resp: 16 18  Temp: (!) 36.4 C 36.8 C  SpO2: 97% 97%    Last Pain:  Vitals:   04/07/21 0804  TempSrc: Axillary  PainSc:                  [place]

## 2021-04-07 NOTE — Progress Notes (Signed)
Amiodarone Drug - Drug Interaction Consult Note  Recommendations:  Monitor QTc, lytes  Amiodarone is metabolized by the cytochrome P450 system and therefore has the potential to cause many drug interactions. Amiodarone has an average plasma half-life of 50 days (range 20 to 100 days).   There is potential for drug interactions to occur several weeks or months after stopping treatment and the onset of drug interactions may be slow after initiating amiodarone.   []  Statins: Increased risk of myopathy. Simvastatin- restrict dose to 20mg  daily. Other statins: counsel patients to report any muscle pain or weakness immediately.  []  Anticoagulants: Amiodarone can increase anticoagulant effect. Consider warfarin dose reduction. Patients should be monitored closely and the dose of anticoagulant altered accordingly, remembering that amiodarone levels take several weeks to stabilize.  []  Antiepileptics: Amiodarone can increase plasma concentration of phenytoin, the dose should be reduced. Note that small changes in phenytoin dose can result in large changes in levels. Monitor patient and counsel on signs of toxicity.  []  Beta blockers: increased risk of bradycardia, AV block and myocardial depression. Sotalol - avoid concomitant use.  []   Calcium channel blockers (diltiazem and verapamil): increased risk of bradycardia, AV block and myocardial depression.  []   Cyclosporine: Amiodarone increases levels of cyclosporine. Reduced dose of cyclosporine is recommended.  []  Digoxin dose should be halved when amiodarone is started.  [x]  Diuretics: increased risk of cardiotoxicity if hypokalemia occurs.  []  Oral hypoglycemic agents (glyburide, glipizide, glimepiride): increased risk of hypoglycemia. Patient's glucose levels should be monitored closely when initiating amiodarone therapy.   []  Drugs that prolong the QT interval:  Torsades de pointes risk may be increased with concurrent use - avoid if possible.   Monitor QTc, also keep magnesium/potassium WNL if concurrent therapy can't be avoided. Marland Kitchen Antibiotics: e.g. fluoroquinolones, erythromycin. . Antiarrhythmics: e.g. quinidine, procainamide, disopyramide, sotalol. . Antipsychotics: e.g. phenothiazines, haloperidol.  . Lithium, tricyclic antidepressants, and methadone. Thank You,  Remigio Eisenmenger D. Mina Marble, PharmD, BCPS, [place] 04/07/2021, 5:11 PM

## 2021-04-07 NOTE — Progress Notes (Signed)
CCMD notified RN that patient's HR in 120s. RN went in to assessed patient and performed EKG which showed atrial-flutter. Patient stated he felt a little "whoozy" but felt" fine" otherwise. Tommye Standard, PA and  Gardiner Rhyme, MD paged. See new orders   Vital signs: BP 117/65, HR 126, RR 18, 94% on 2L

## 2021-04-07 NOTE — Care Management Important Message (Signed)
Important Message  Patient Details  Name: Jose Fitzpatrick MRN: [id number] Date of Birth: 04/14/1946   Medicare Important Message Given:  Yes     Shelda Altes 04/07/2021, 9:50 AM

## 2021-04-07 NOTE — Progress Notes (Signed)
Progress Note   Subjective   Reports dyspnea improved  Inpatient Medications    Scheduled Meds: . amiodarone  200 mg Oral BID  . apixaban  5 mg Oral BID  . bacitracin   Topical BID  . folic acid  1 mg Oral Daily  . gabapentin  600 mg Oral QPM  . mouth rinse  15 mL Mouth Rinse BID  . multivitamin with minerals  1 tablet Oral Daily  . potassium chloride  20 mEq Oral Daily  . testosterone cypionate  200 mg Intramuscular Q14 Days  . thiamine  100 mg Oral Daily   Or  . thiamine  100 mg Intravenous Daily  . torsemide  40 mg Oral Daily  . vitamin B-12  1,000 mcg Oral Daily   Continuous Infusions: . sodium chloride Stopped (04/04/21 0949)  . sodium chloride     PRN Meds: sodium chloride, Place/Maintain arterial line **AND** sodium chloride, acetaminophen, docusate sodium, polyethylene glycol   Vital Signs    Vitals:   03/29/2021 2010 04/07/21 0032 04/07/21 0500 04/07/21 0804  BP: 123/67 (!) 133/92 130/62 120/67  Pulse: 98 98 98 (!) 103  Resp: 13 20 16 18   Temp: 97.6 F (36.4 C) 98 F (36.7 C) (!) 97.5 F (36.4 C) 98.3 F (36.8 C)  TempSrc: Oral Oral Oral Axillary  SpO2: 93%  97% 97%  Weight:   (!) 171.8 kg   Height:        Intake/Output Summary (Last 24 hours) at 04/07/2021 0951 Last data filed at 04/07/2021 0809 Gross per 24 hour  Intake 425.56 ml  Output 750 ml  Net -324.44 ml   Filed Weights   04/03/2021 0500 03/18/2021 0839 04/07/21 0500  Weight: (!) 166.7 kg (!) 166.7 kg (!) 171.8 kg    Telemetry    NSR 90-100s, 9 beats SVT, 6 beats NSVT- Personally Reviewed  Physical Exam  GEN: in no acute distress HEENT: normal Neck: no JVD appreciated Cardiac: regular, tachycardic, no murmurs Respiratory:  Expiratory wheezing GI: soft, nontender, nondistended MS: venous stasis changes Skin: warm and dry, no rash Neuro:  Alert and Oriented x 3 Psych: normal affect    Labs    Chemistry Recent Labs  Lab 04/05/21 1509 03/16/2021 0302 04/07/21 0213  NA  132* 132* 136  K 4.3 4.1 4.5  CL 83* 86* 86*  CO2 40* 40* 42*  GLUCOSE 208* 97 105*  BUN 29* 24* 20  CREATININE 1.12 1.11 1.15  CALCIUM 8.0* 8.4* 8.6*  GFRNONAA >60 >60 >60  ANIONGAP 9 6 8      Hematology Recent Labs  Lab 04/04/21 0500 04/05/21 0355 04/12/2021 0302  WBC 7.6 8.3 7.3  RBC 5.44 4.99 5.22  HGB 17.8* 16.5 16.8  HCT 54.9* 49.4 51.9  MCV 100.9* 99.0 99.4  MCH 32.7 33.1 32.2  MCHC 32.4 33.4 32.4  RDW 13.1 12.8 12.9  PLT 174 136* 174     Patient ID  Jose Fitzpatrick A Carpenteris a 75 y.o.malewith hypertension, morbid obesity, chronic venous insufficiency, possible alcohol abuse who was admitted on 04/11/2021 with shortness of breath and acute decompensated systolic heart failure. Found to be in atrial flutter as well as afib with RVR.   Assessment & Plan    Atrial fibrillation/ atrial flutter with RVR: suspected cause of his decompensation.  Underwent TEE guided [facility] 03/23/2021 but quickly returned to afib. Loaded with amio and underwent repeat DCCV on 5/23 -Maintaining sinus rhythm, would continue amiodarone 200 mg BID -Continue eliquis.   -  Stopped digoxin  Hypotension: required transfer to ICU and pressor support on 5/20. No evidence of infection, procalcition <0.1.  Lactate 1.0.  Co-ox on 5/20 67%, not consistent with cardiogenic shock.  ?Overdiuresis in setting of taking heart failure meds and AF with RVR contributing, held HF meds/diuresis and underwent successful DCCV yesterday.  Hypotension has resolved  New onset CHF: EF 25-30%, new diagnosis.  Likely tachycardia mediated -He has been aggressively diuresed, net negative 29L on admission.  Converted to PO torsemide yesterday.  Difficult to assess volume status given body habitus, does have wheezing today, will check CXR -Developed hypotension earlier this admission on entresto 24-26 mg BID, aldactone 25 mg daily, metoprolol 25 mg qid.  BP has improved, will rechallenge with low dose losartan and toprol XL, if tolerating  can plan to transition to entresto as outpatient -Reassess EF after sinus rhythm has been maintained for several months   [name], MD

## 2021-04-08 ENCOUNTER — Other Ambulatory Visit (HOSPITAL_COMMUNITY): Payer: Self-pay

## 2021-04-08 ENCOUNTER — Inpatient Hospital Stay: Payer: Self-pay

## 2021-04-08 DIAGNOSIS — I4891 Unspecified atrial fibrillation: Secondary | ICD-10-CM | POA: Diagnosis not present

## 2021-04-08 DIAGNOSIS — I5041 Acute combined systolic (congestive) and diastolic (congestive) heart failure: Secondary | ICD-10-CM | POA: Diagnosis not present

## 2021-04-08 LAB — BASIC METABOLIC PANEL
Anion gap: 10 (ref 5–15)
BUN: 22 mg/dL (ref 8–23)
CO2: 39 mmol/L — ABNORMAL HIGH (ref 22–32)
Calcium: 8.4 mg/dL — ABNORMAL LOW (ref 8.9–10.3)
Chloride: 87 mmol/L — ABNORMAL LOW (ref 98–111)
Creatinine, Ser: 1.13 mg/dL (ref 0.61–1.24)
GFR, Estimated: >60 mL/min (ref >60)
Glucose, Bld: 103 mg/dL — ABNORMAL HIGH (ref 70–99)
Potassium: 4.1 mmol/L (ref 3.5–5.1)
Sodium: 136 mmol/L (ref 135–145)

## 2021-04-08 LAB — MAGNESIUM: Magnesium: 1.9 mg/dL (ref 1.7–2.4)

## 2021-04-08 MED ORDER — AMIODARONE HCL IN DEXTROSE 360-4.14 MG/200ML-% IV SOLN
60.0000 mg/h | INTRAVENOUS | Status: AC
  Administered 2021-04-08: 60 mg/h via INTRAVENOUS
  Filled 2021-04-08: qty 200

## 2021-04-08 MED ORDER — DIGOXIN 125 MCG PO TABS
0.1250 mg | ORAL_TABLET | Freq: Every day | ORAL | Status: DC
  Administered 2021-04-08 – 2021-04-09 (×2): 0.125 mg via ORAL
  Filled 2021-04-08 (×2): qty 1

## 2021-04-08 MED ORDER — METOPROLOL TARTRATE 25 MG PO TABS
25.0000 mg | ORAL_TABLET | Freq: Two times a day (BID) | ORAL | Status: DC
  Administered 2021-04-08 – 2021-04-12 (×9): 25 mg via ORAL
  Filled 2021-04-08 (×9): qty 1

## 2021-04-08 MED ORDER — MAGNESIUM SULFATE 2 GM/50ML IV SOLN
2.0000 g | Freq: Once | INTRAVENOUS | Status: AC
  Administered 2021-04-08: 2 g via INTRAVENOUS
  Filled 2021-04-08: qty 50

## 2021-04-08 MED ORDER — AMIODARONE HCL IN DEXTROSE 360-4.14 MG/200ML-% IV SOLN
30.0000 mg/h | INTRAVENOUS | Status: DC
  Administered 2021-04-08 – 2021-04-16 (×17): 30 mg/h via INTRAVENOUS
  Filled 2021-04-08 (×17): qty 200

## 2021-04-08 MED ORDER — SODIUM CHLORIDE 0.9% FLUSH: 10.0000 mL | INTRAVENOUS | Status: DC | PRN

## 2021-04-08 MED ORDER — SODIUM CHLORIDE 0.9% FLUSH
10.0000 mL | Freq: Two times a day (BID) | INTRAVENOUS | Status: DC
  Administered 2021-04-09: 10 mL
  Administered 2021-04-09: 20 mL
  Administered 2021-04-09 – 2021-04-17 (×15): 10 mL

## 2021-04-08 MED ORDER — CHLORHEXIDINE GLUCONATE CLOTH 2 % EX PADS
6.0000 | MEDICATED_PAD | Freq: Every day | CUTANEOUS | Status: DC
  Administered 2021-04-09 – 2021-04-17 (×9): 6 via TOPICAL

## 2021-04-08 NOTE — Progress Notes (Signed)
Heart Failure Stewardship Pharmacist Progress Note   PCP: Tamsen Roers, MD PCP-Cardiologist: Kirk Ruths, MD    HPI:  75 YO male with PMH significant for HTN, morbid obesity, possible alcohol abuse. Admitted 04/11/2021 with shortness of breath and acute decompensated systolic HF; also in atrial flutter and afib with RVR. Underwent TEE guided [facility], however afib returned. Repeat was successful and converted back to AFL 5/24. Newly diagnosed CHF with EF of 25-30%.   Current HF Medications: Furosemide 80 mg IV BID Metoprolol tartrate 25 mg BID Digoxin 0.125 mg daily   Prior to admission HF Medications: Metoprolol succinate 50 mg daily  Lisinopril-HCTZ 20-25 mg    Pertinent Lab Values: . Serum creatinine 1.13, BUN 22, Potassium 4.1, Sodium 136, BNP 77.5, Magnesium 1.9, Digoxin 0.3 (5/22)   Vital Signs: . Weight: 382.94 lbs (admission weight: 400 lbs) . Blood pressure: 110s/60-70s  . Heart rate: 120s    Medication Assistance / Insurance Benefits Check: Does the patient have prescription insurance?  Yes Type of insurance plan: [employer] Medicare  Does the patient qualify for medication assistance through manufacturers or grants?   Pending   Outpatient Pharmacy:  Prior to admission [facility] Is the patient willing to use [place] pharmacy at discharge? Pending Is the patient willing to transition their outpatient pharmacy to utilize a [facility] outpatient pharmacy?   Pending    Assessment: 1. Acute on chronic systolic CHF (EF 61-44%). NYHA class II/III symptoms. - Continue current diuresis regimen; difficult to assess volume status due to BMI; renal function stable, urine output documentation limited - Continue metoprolol tartrate 25 mg BID - consider transitioning to XL formulation prior to discharge  - Holding Entresto and spironolactone for now - developed hypotension earlier in admission after administration of these agents; consider restarting prior to discharge  as patient can tolerate; Please note that patient was taking lisinopril-hctz PTA - Holding off on SGLT2 for now - UA positive for E coli, may exacerbate. Consider starting in future if found appropriate  - Continue Digoxin 0.125 mg daily    Plan: 1) Medication changes recommended at this time: - No recommendations to make at this time   2) Patient assistance: - [place] Copay: $45/month - [place] Copay: $95/month - Unable to complete copay test for Entresto due to recent fill of lisinopril   3)  Education  - To be completed prior to discharge  Kerby Nora, PharmD, BCPS Heart Failure Stewardship Pharmacist Phone [phone]

## 2021-04-08 NOTE — Progress Notes (Signed)
PROGRESS NOTE    Jose Fitzpatrick  VZC:[id number] DOB: [birth year] DOA: 04/10/2021 PCP: Tamsen Roers, MD    Brief Narrative:  Jose Fitzpatrick is a 75 year old male with past medical history significant for essential hypertension, morbid obesity, peripheral neuropathy who presented to the ED on 5/10 with shortness of breath and worsening lower extremity edema.  Patient reports onset of symptoms over the past 2 days with associated difficulty lying flat and sleeping on incline.  He also describes weeping of fluids on his lower extremities.  Denies chest pain, no palpitations or chest pain.  No previous cardiac history.  Denies heavy alcohol use or illicit drug use.  In the ED, patient was noted to be in atrial fibrillation with RVR with rates of 280.  BNP 88, high sensitive troponin 12.  Chest x-ray with mild cardiomegaly and vascular congestion/pulmonary edema.  Patient was given 20 mg IV diltiazem bolus and started on diltiazem infusion.  Patient was also given 60 mg of IV Lasix with over 1 L output.  Cardiology was consulted.  TRH consulted for further evaluation and management of new onset atrial fibrillation with RVR.   Assessment & Plan:   Principal Problem:   Atrial fibrillation with RVR (HCC) Active Problems:   HTN (hypertension)   Acute CHF (congestive heart failure) (HCC)   Acute respiratory failure with hypoxia (HCC)   Elevated TSH   BMI 50.0-59.9, adult (HCC)   Hypercapnic respiratory failure (HCC)   Pressure injury of skin   Atrial fibrillation with RVR, new diagnosis Patient presenting to ED with progressive shortness of breath and lower extremity edema.  On arrival, patient was noted to be in A. fib with RVR.  TSH 4.870 on 5/11.  Underwent DCCV 5/16 and 5/23, unfortunately reconverted back to A. fib on 5/24. --Amiodarone drip --Metoprolol tartrate 25 mg p.o. twice daily. --Cardiology restarting digoxin 0.125 mg p.o. daily today --Eliquis 5 mg p.o. twice daily  New  onset acute systolic congestive heart failure Acute hypoxic respiratory failure, POA Patient presenting to ED with progressive shortness of breath and lower extremity edema.  Hypoxic on presentation.  Chest x-ray with interstitial pulmonary edema and small bilateral pleural effusions.  TTE with LVEF 25-30%.  Suspect possibly related to arrhythmia. --net positive 295mL past 24 hrs and net negative 28.9L since admission --Furosemide 80 mg IV every 12 hours --Holding Aldactone and Entresto due to borderline hypotension --UNNA boots --Strict I's and O's and daily weights --Continue supplemental oxygen, maintain SPO2 greater than 92%, currently on 2 L nasal cannula --BMP daily  Acute renal failure --Cr 1.02>>>1.44>1.22>>1.04>1.15>1.13 --BMP daily  Hx Essential hypertension BP 99/61 this morning.  Likely complicated by persistent A. fib with RVR.  Required transfer to ICU on pressure support on 5/20; possibly due to aggressive diuresis in the setting of taking heart failure medications and A. fib with RVR. --Metoprolol tartrate 25 mg p.o. twice daily --Ling Aldactone and Entresto --Hope BP will improve with better rate control --Continue monitor BP closely  Subclinical hypothyroidism TSH slightly elevated 4.870 with normal free T3/4.  EtOH use disorder Counseled on need for complete cessation. --Thiamine, B12, multivitamin, folic acid  Morbid obesity Body mass index is 47.86 kg/m.  Discussed with patient needs for aggressive lifestyle changes/weight loss as this complicates all facets of care.  Outpatient follow-up with PCP.  May benefit from bariatric evaluation outpatient.  Pressure injury bilateral ears, POA Pressure Injury 04/05/21 Ear Left Stage 2 -  Partial thickness loss of dermis presenting as  a shallow open injury with a red, pink wound bed without slough. from oxygen tubing (Active)  04/05/21 1312  Location: Ear  Location Orientation: Left  Staging: Stage 2 -  Partial  thickness loss of dermis presenting as a shallow open injury with a red, pink wound bed without slough.  Wound Description (Comments): from oxygen tubing  Present on Admission: Yes     Pressure Injury 04/05/21 Ear Right Stage 2 -  Partial thickness loss of dermis presenting as a shallow open injury with a red, pink wound bed without slough. from oxygen tubing (Active)  04/05/21 1317  Location: Ear  Location Orientation: Right  Staging: Stage 2 -  Partial thickness loss of dermis presenting as a shallow open injury with a red, pink wound bed without slough.  Wound Description (Comments): from oxygen tubing  Present on Admission: Yes  --Continue local wound care  Weakness/deconditioning/debility: -- Continue PT/OT efforts while inpatient   DVT prophylaxis:  apixaban (ELIQUIS) tablet 5 mg    Code Status: DNR Family Communication: No family present at bedside this morning  Disposition Plan:  Level of care: Progressive Status is: Inpatient  Remains inpatient appropriate because:Ongoing diagnostic testing needed not appropriate for outpatient work up, Unsafe d/c plan, IV treatments appropriate due to intensity of illness or inability to take PO and Inpatient level of care appropriate due to severity of illness   Dispo: The patient is from: Home              Anticipated d/c is to: SNF              Patient currently is not medically stable to d/c.   Difficult to place patient No  Consultants:   Cardiology  Procedures:   TTE 5/13: LVEF 35%  TEE/DCCV 5/16  TEE/DCCV 5/23  Antimicrobials:   none   Subjective: Patient seen examined at bedside, resting comfortably.  Continues with mild shortness of breath although much improved since initial presentation.  Continues on supple oxygen, down to 2 L nasal cannula.  Continues with lower extremity edema.  Heart rate remains poorly controlled on amiodarone drip.  Cardiology restarting digoxin today.  No other questions or concerns at  this time.  Denies headache, no fever/chills/night sweats, no nausea/vomiting/diarrhea, no active chest pain, no palpitations, no current shortness of breath, no abdominal pain.  No other acute events overnight besides poorly controlled HR per nursing staff.  Objective: Vitals:   04/08/21 0407 04/08/21 0600 04/08/21 1149 04/08/21 1606  BP: 111/65 99/61 118/68 129/74  Pulse: (!) 120 (!) 127 (!) 122 (!) 124  Resp: 20 20 17 16   Temp: 99.8 F (37.7 C)  98.7 F (37.1 C) 98.2 F (36.8 C)  TempSrc: Axillary  Oral Oral  SpO2: 95% 95% 94% 94%  Weight: (!) 173.7 kg     Height:        Intake/Output Summary (Last 24 hours) at 04/08/2021 4580 Last data filed at 04/08/2021 1811 Gross per 24 hour  Intake 924.54 ml  Output 2000 ml  Net -1075.46 ml   Filed Weights   03/18/2021 0839 04/07/21 0500 04/08/21 0407  Weight: (!) 166.7 kg (!) 171.8 kg (!) 173.7 kg    Examination:  General exam: Appears calm and comfortable, obese, chronically ill in appearance Respiratory system: Decreased breath sounds bilateral bases without crackles/wheezing, normal [place] effort, on 2 L nasal cannula with SPO2 96% Cardiovascular system: S1 & S2 heard, irregularly irregular rhythm, tachycardic. No JVD, murmurs, rubs, gallops or clicks. No  pedal edema. Gastrointestinal system: Abdomen is nondistended, soft and nontender. No organomegaly or masses felt. Normal bowel sounds heard. Central nervous system: Alert and oriented. No focal neurological deficits. Extremities: Symmetric 5 x 5 power. Skin: No rashes, lesions or ulcers Psychiatry: Judgement and insight appear poor. Mood & affect appropriate.     Data Reviewed: I have personally reviewed following labs and imaging studies  CBC: Recent Labs  Lab 04/02/21 0624 04/03/21 0601 04/04/21 0500 04/05/21 0355 03/27/2021 0302  WBC 7.8 7.4 7.6 8.3 7.3  HGB 15.9 16.4 17.8* 16.5 16.8  HCT 49.4 51.1 54.9* 49.4 51.9  MCV 102.9* 102.4* 100.9* 99.0 99.4  PLT 156 176  174 136* 785   Basic Metabolic Panel: Recent Labs  Lab 04/04/21 0500 04/05/21 0355 04/05/21 1509 03/29/2021 0302 04/07/21 0213 04/08/21 0459  NA 134*  --  132* 132* 136 136  K 3.9  --  4.3 4.1 4.5 4.1  CL 85*  --  83* 86* 86* 87*  CO2 39*  --  40* 40* 42* 39*  GLUCOSE 108*  --  208* 97 105* 103*  BUN 31*  --  29* 24* 20 22  CREATININE 1.04  --  1.12 1.11 1.15 1.13  CALCIUM 8.5*  --  8.0* 8.4* 8.6* 8.4*  MG 1.9 1.9 2.0 2.1  --  1.9  PHOS  --  2.8 3.1 3.0  --   --    GFR: Estimated Creatinine Clearance: 96 mL/min (by C-G formula based on SCr of 1.13 mg/dL). Liver Function Tests: No results for input(s): AST, ALT, ALKPHOS, BILITOT, PROT, ALBUMIN in the last 168 hours. No results for input(s): LIPASE, AMYLASE in the last 168 hours. No results for input(s): AMMONIA in the last 168 hours. Coagulation Profile: Recent Labs  Lab 03/27/2021 0734  INR 1.4*   Cardiac Enzymes: No results for input(s): CKTOTAL, CKMB, CKMBINDEX, TROPONINI in the last 168 hours. BNP (last 3 results) No results for input(s): PROBNP in the last 8760 hours. HbA1C: No results for input(s): HGBA1C in the last 72 hours. CBG: No results for input(s): GLUCAP in the last 168 hours. Lipid Profile: No results for input(s): CHOL, HDL, LDLCALC, TRIG, CHOLHDL, LDLDIRECT in the last 72 hours. Thyroid Function Tests: No results for input(s): TSH, T4TOTAL, FREET4, T3FREE, THYROIDAB in the last 72 hours. Anemia Panel: No results for input(s): VITAMINB12, FOLATE, FERRITIN, TIBC, IRON, RETICCTPCT in the last 72 hours. Sepsis Labs: Recent Labs  Lab 04/04/21 1118 04/04/21 1238 04/04/21 1441 04/05/21 0355 03/17/2021 0302  PROCALCITON  --  <0.10  --  <0.10 <0.10  LATICACIDVEN 1.0  --  1.0  --   --     Recent Results (from the past 240 hour(s))  Culture, blood (routine x 2)     Status: None (Preliminary result)   Collection Time: 04/04/21  1:27 PM   Specimen: BLOOD  Result Value Ref Range Status   Specimen  Description   Final    BLOOD LEFT ANTECUBITAL Performed at [facility] [address], [place], [place] [address]    Special Requests   Final    BOTTLES DRAWN AEROBIC ONLY Blood Culture results may not be optimal due to an excessive volume of blood received in culture bottles Performed at [facility] [address], [place], [place] [address]    Culture   Final    NO GROWTH 4 DAYS Performed at [facility], [place] [address], [place], [place] [address]    Report  Status PENDING  Incomplete  Culture, blood (routine x 2)     Status: None (Preliminary result)   Collection Time: 04/04/21  1:27 PM   Specimen: BLOOD  Result Value Ref Range Status   Specimen Description   Final    BLOOD LEFT ANTECUBITAL Performed at [facility] [address], [place], [place] [address]    Special Requests   Final    BOTTLES DRAWN AEROBIC ONLY Blood Culture results may not be optimal due to an excessive volume of blood received in culture bottles Performed at [facility] [address] [place], [place] [address]    Culture   Final    NO GROWTH 4 DAYS Performed at [facility], [place] [address], [place], [place] [address]    Report Status PENDING  Incomplete  Culture, Urine     Status: Abnormal   Collection Time: 04/04/21  6:43 PM   Specimen: Urine, Clean Catch  Result Value Ref Range Status   Specimen Description   Final    URINE, CLEAN CATCH Performed at [facility], [place] [address], [place], [place] [address]    Special Requests   Final    NONE Performed at [facility], [place] [address] [place], [place] [address]    Culture (A)  Final    >=100,000 COLONIES/mL ESCHERICHIA COLI 80,000 COLONIES/mL ENTEROCOCCUS FAECALIS Confirmed Extended Spectrum Beta-Lactamase Producer (ESBL).  In bloodstream infections from ESBL organisms, carbapenems are preferred over  piperacillin/tazobactam. They are shown to have a lower risk of mortality.    Report Status 04/07/2021 FINAL  Final   Organism ID, Bacteria ESCHERICHIA COLI (A)  Final   Organism ID, Bacteria ENTEROCOCCUS FAECALIS (A)  Final      Susceptibility   Escherichia coli - MIC*    AMPICILLIN >=32 RESISTANT Resistant     CEFAZOLIN >=64 RESISTANT Resistant     CEFEPIME 8 INTERMEDIATE Intermediate     CEFTRIAXONE >=64 RESISTANT Resistant     CIPROFLOXACIN <=0.25 SENSITIVE Sensitive     GENTAMICIN <=1 SENSITIVE Sensitive     IMIPENEM <=0.25 SENSITIVE Sensitive     NITROFURANTOIN <=16 SENSITIVE Sensitive     TRIMETH/SULFA <=20 SENSITIVE Sensitive     AMPICILLIN/SULBACTAM >=32 RESISTANT Resistant     PIP/TAZO <=4 SENSITIVE Sensitive     * >=100,000 COLONIES/mL ESCHERICHIA COLI   Enterococcus faecalis - MIC*    AMPICILLIN <=2 SENSITIVE Sensitive     NITROFURANTOIN <=16 SENSITIVE Sensitive     VANCOMYCIN 1 SENSITIVE Sensitive     * 80,000 COLONIES/mL ENTEROCOCCUS FAECALIS         Radiology Studies: DG CHEST PORT 1 VIEW  Result Date: 04/07/2021 CLINICAL DATA:  Dyspnea. EXAM: PORTABLE CHEST 1 VIEW COMPARISON:  Apr 04, 2021. FINDINGS: Stable cardiomediastinal silhouette. No pneumothorax is noted. Bibasilar interstitial densities are noted consistent with subsegmental atelectasis or edema with associated small pleural effusions. Bony thorax is unremarkable. IMPRESSION: Bibasilar subsegmental atelectasis or edema is noted with associated small pleural effusions. Electronically Signed   By: Marijo Conception M.D.   On: 04/07/2021 12:09   [place] EKG SITE RITE  Result Date: 04/08/2021 If Site Rite image not attached, placement could not be confirmed due to current cardiac rhythm.       Scheduled Meds: . apixaban  5 mg Oral BID  . bacitracin   Topical BID  . Chlorhexidine Gluconate Cloth  6 each Topical Daily  . digoxin  0.125 mg Oral Daily  .  folic acid  1 mg Oral Daily  . furosemide  80 mg  Intravenous BID  . gabapentin  600 mg Oral QPM  . mouth rinse  15 mL Mouth Rinse BID  . metoprolol tartrate  25 mg Oral BID  . multivitamin with minerals  1 tablet Oral Daily  . potassium chloride  20 mEq Oral Daily  . sodium chloride flush  10-40 mL Intracatheter Q12H  . testosterone cypionate  200 mg Intramuscular Q14 Days  . thiamine  100 mg Oral Daily   Or  . thiamine  100 mg Intravenous Daily  . vitamin B-12  1,000 mcg Oral Daily   Continuous Infusions: . sodium chloride Stopped (04/04/21 0949)  . sodium chloride    . amiodarone 30 mg/hr (04/08/21 1011)     LOS: 15 days    Time spent: 42 minutes spent on chart review, discussion with nursing staff, consultants, updating family and interview/physical exam; more than 50% of that time was spent in counseling and/or coordination of care.    Jaylinn Hellenbrand J [place] DO [facility] Available via Epic secure chat 7am-7pm After these hours, please refer to coverage provider listed on [email or web address] 04/08/2021, 6:22 PM

## 2021-04-08 NOTE — Progress Notes (Signed)
Peripherally Inserted Central Catheter Placement  The IV Nurse has discussed with the patient and/or persons authorized to consent for the patient, the purpose of this procedure and the potential benefits and risks involved with this procedure.  The benefits include less needle sticks, lab draws from the catheter, and the patient may be discharged home with the catheter. Risks include, but not limited to, infection, bleeding, blood clot (thrombus formation), and puncture of an artery; nerve damage and irregular heartbeat and possibility to perform a PICC exchange if needed/ordered by physician.  Alternatives to this procedure were also discussed.  Bard Power PICC patient education guide, fact sheet on infection prevention and patient information card has been provided to patient /or left at bedside.    PICC Placement Documentation  PICC Double Lumen 43/27/61 PICC Right Basilic 45 cm 0 cm (Active)  Indication for Insertion or Continuance of Line Chronic illness with exacerbations (CF, Sickle Cell, etc.) 04/08/21 1100  Exposed Catheter (cm) 0 cm 04/08/21 1100  Site Assessment Clean;Dry;Intact 04/08/21 1100  Lumen #1 Status Flushed;Saline locked;Blood return noted 04/08/21 1100  Lumen #2 Status Flushed;Saline locked;Blood return noted 04/08/21 1100  Dressing Type Transparent;Securing device 04/08/21 1100  Dressing Status Clean;Dry;Intact 04/08/21 1100  Antimicrobial disc in place? Yes 04/08/21 1100  Safety Lock Not Applicable 47/09/29 5747  Line Care Connections checked and tightened 04/08/21 1100  Dressing Intervention New dressing 04/08/21 1100  Dressing Change Due 04/15/21 04/08/21 [place], [place] 04/08/2021, 11:31 AM

## 2021-04-08 NOTE — Progress Notes (Signed)
HOSPITAL MEDICINE OVERNIGHT EVENT NOTE    Notified by nursing that patient is beginning to develop worsening rate control after the automated reduction in amiodarone infusion from 60 mg an hour to 30 mg an hour.  Will increase rate of amiodarone to 60 mg an hour for an additional 6 hours in an effort to better achieve rate control.  Continuing to monitor patient closely on telemetry.  Vernelle Emerald  MD [facility]

## 2021-04-08 NOTE — Progress Notes (Signed)
Progress Note   Subjective   Reports dyspnea improved  Inpatient Medications    Scheduled Meds: . apixaban  5 mg Oral BID  . bacitracin   Topical BID  . folic acid  1 mg Oral Daily  . furosemide  80 mg Intravenous BID  . gabapentin  600 mg Oral QPM  . mouth rinse  15 mL Mouth Rinse BID  . metoprolol tartrate  25 mg Oral BID  . multivitamin with minerals  1 tablet Oral Daily  . potassium chloride  20 mEq Oral Daily  . testosterone cypionate  200 mg Intramuscular Q14 Days  . thiamine  100 mg Oral Daily   Or  . thiamine  100 mg Intravenous Daily  . vitamin B-12  1,000 mcg Oral Daily   Continuous Infusions: . sodium chloride Stopped (04/04/21 0949)  . sodium chloride    . amiodarone 60 mg/hr (04/08/21 0401)   Followed by  . amiodarone     PRN Meds: sodium chloride, Place/Maintain arterial line **AND** sodium chloride, acetaminophen, docusate sodium, polyethylene glycol   Vital Signs    Vitals:   04/07/21 1957 04/07/21 2354 04/08/21 0407 04/08/21 0600  BP: 124/63 115/71 111/65 99/61  Pulse: (!) 124 (!) 121 (!) 120 (!) 127  Resp: 20 16 20 20   Temp: 98.4 F (36.9 C) 97.9 F (36.6 C) 99.8 F (37.7 C)   TempSrc: Oral Oral Axillary   SpO2: 93% 95% 95% 95%  Weight:   (!) 173.7 kg   Height:        Intake/Output Summary (Last 24 hours) at 04/08/2021 0841 Last data filed at 04/08/2021 0600 Gross per 24 hour  Intake 834.54 ml  Output 800 ml  Net 34.54 ml   Filed Weights   03/28/2021 0839 04/07/21 0500 04/08/21 0407  Weight: (!) 166.7 kg (!) 171.8 kg (!) 173.7 kg    Telemetry    AFL 120s- Personally Reviewed  Physical Exam  GEN: in no acute distress HEENT: normal Neck: no JVD appreciated but difficult to assess given habitus Cardiac: regular, tachycardic, no murmurs Respiratory:  Diminished breath sounds at bsases GI: soft, nontender, nondistended MS: venous stasis changes, 1+ edema Skin: warm and dry, no rash Neuro:  Alert and Oriented x 3 Psych: normal  affect    Labs    Chemistry Recent Labs  Lab 04/05/2021 0302 04/07/21 0213 04/08/21 0459  NA 132* 136 136  K 4.1 4.5 4.1  CL 86* 86* 87*  CO2 40* 42* 39*  GLUCOSE 97 105* 103*  BUN 24* 20 22  CREATININE 1.11 1.15 1.13  CALCIUM 8.4* 8.6* 8.4*  GFRNONAA >60 >60 >60  ANIONGAP 6 8 10      Hematology Recent Labs  Lab 04/04/21 0500 04/05/21 0355 04/14/2021 0302  WBC 7.6 8.3 7.3  RBC 5.44 4.99 5.22  HGB 17.8* 16.5 16.8  HCT 54.9* 49.4 51.9  MCV 100.9* 99.0 99.4  MCH 32.7 33.1 32.2  MCHC 32.4 33.4 32.4  RDW 13.1 12.8 12.9  PLT 174 136* 174     Patient ID  Jose Brow A Carpenteris a 75 y.o.malewith hypertension, morbid obesity, chronic venous insufficiency, possible alcohol abuse who was admitted on 03/23/2021 with shortness of breath and acute decompensated systolic heart failure. Found to be in atrial flutter as well as afib with RVR.   Assessment & Plan    Atrial fibrillation/ atrial flutter with RVR: Underwent TEE guided [facility] 03/23/2021 but quickly returned to afib. Loaded with amio and underwent repeat DCCV on  5/23.  Converted back to AFL on 5/24 -Continue amio gtt -Continue metprolol 25 mg BID, would not titrate further right now given soft pressures -Continue eliquis.   -Would restart digoxin, discussed with pharmacy: did not recommend reloading, recommend 0.125 mg daily and recheck level on 5/27  Hypotension: required transfer to ICU and pressor support on 5/20. No evidence of infection, procalcition <0.1.  Lactate 1.0.  Co-ox on 5/20 67%, not consistent with cardiogenic shock.  Possibly due to aggressive diuresis in setting of taking heart failure meds and AF with RVR contributing, held HF meds/diuresis and underwent successful DCCV 5/24.  Hypotension has resolved, but BP soft again since back in AFL  New onset CHF: EF 25-30%, new diagnosis.  Likely tachycardia mediated -He has been aggressively diuresed, net negative 29L on admission.  Currently on IV lasix 80 mg BID.   Difficult to assess volume status given habitus but suspect needs more diuresis, recommend PICC line placement to monitor CVP -Developed hypotension earlier this admission on entresto 24-26 mg BID, aldactone 25 mg daily, metoprolol 25 mg qid.  Holding aldactone and entresto.   Jose Heinz, MD

## 2021-04-08 NOTE — Progress Notes (Signed)
Rehab Admissions Coordinator Note:  Patient was screened by Cleatrice Burke for appropriateness for an Inpatient Acute Rehab Consult per PT recs. OT recs SNF. Evals limited today. .  At this time, we are recommending await further demonstration of tolerance for more intensive therapies. I will follow, but will not place rehab conuslt at this time.  Cleatrice Burke RN MSN 04/08/2021, 7:06 PM  I can be reached at [phone].

## 2021-04-08 NOTE — Progress Notes (Addendum)
Physical Therapy Treatment Patient Details Name: Jose Fitzpatrick MRN: [id number] DOB: 1946/06/15 Today's Date: 04/08/2021    History of Present Illness Pt is a 75 y.o. male admitted to [facility] on 04/12/2021 with BLE swelling, DOE, weaight gain. Workup for CHF exacerbation, aflutter/afib with RVR. S/p TEE guided DCCV 5/16 but quickly returned to afib. Pt with hypotension and transfer to [facility] on 5/21. S/p repeat DCCV 5/23. Converted back to AFL 5/24. 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   PT Comments    Pt progressing with mobility. Tolerated seated EOB activity, requiring mod-maxA+2 for bed mobility, progressing to seated balance with UE support and min guard. Pt adamantly declining attempts at standing or transfers this session, c/o dizziness and 'feeling poorly'; pt states, "I'll do anything you need me to do tomorrow... I just can't right now." Seated EOB BP 121/80, HR sustaining 120s. Pt remains limited by generalized weakness, decreased activity tolerance and impaired balance strategies. Pt will require post-acute rehab to maximize functional mobility and independence prior to return home; hopeful pt will be able to tolerate intensive CIR-level therapies;.   Follow Up Recommendations  CIR;Supervision for mobility/OOB (pending progress)     Equipment Recommendations   (TBD)    Recommendations for Other Services       Precautions / Restrictions Precautions Precautions: Fall;Other (comment) Precaution Comments: Watch HR (afib/aflutter in 120s at rest) Restrictions Weight Bearing Restrictions: No    Mobility  Bed Mobility Overal bed mobility: Needs Assistance Bed Mobility: Rolling;Supine to Sit;Sit to Supine Rolling: Max assist   Supine to sit: Mod assist;+2 for physical assistance;HOB elevated Sit to supine: Mod assist;+2 for physical assistance   General bed mobility comments: Pt assisting well with LLE and BUE support (on rail) to initiate rolling, requiring maxA to  achieve full pelvic rotation; heavy modA+2 to assist trunk elevation and scoot hips to EOB; modA+2 for BLE management and trunk control return to supine; pt able to scoot up with min-modA+2 with bUE support pulling on bed rails when bed flat and in trendelenberg position    Transfers                 General transfer comment:  (pt adamantly declining attempts at standing or transfers; reports "I need to lay down" secondary to 'feeling poorly' and 'dizziness')  Ambulation/Gait                 Stairs             Wheelchair Mobility    Modified Rankin (Stroke Patients Only)       Balance Overall balance assessment: Needs assistance Sitting-balance support: No upper extremity supported;Feet supported;Bilateral upper extremity supported Sitting balance-Leahy Scale: Fair Sitting balance - Comments: Initially requiring min-modA and UE support to maintain static sitting, progressing to min guard with and without UE support; quick to fatigue                                    Cognition Arousal/Alertness: Awake/alert Behavior During Therapy: WFL for tasks assessed/performed;Anxious Overall Cognitive Status: Within Functional Limits for tasks assessed                                 General Comments: WFL for tasks; pt appears anxious with mobility, adamantly stating, "This is all I can do today" despite encouragement/education  Exercises      General Comments General comments (skin integrity, edema, etc.): HR sustaining 125-129 during session (when telemonitor reading); seated BP 121/80; on 2L O2 [place]      Pertinent Vitals/Pain Pain Assessment: Faces Faces Pain Scale: Hurts a little bit Pain Location: RLE neuropathy Pain Descriptors / Indicators: Heaviness;Discomfort Pain Intervention(s): Monitored during session;Limited activity within patient's tolerance    Home Living                      Prior Function             PT Goals (current goals can now be found in the care plan section) Progress towards PT goals: Progressing toward goals (slowly)    Frequency    Min 3X/week      PT Plan Current plan remains appropriate    Co-evaluation PT/OT/SLP Co-Evaluation/Treatment: Yes Reason for Co-Treatment: For patient/therapist safety;To address functional/ADL transfers PT goals addressed during session: Mobility/safety with mobility;Balance        AM-PAC PT "6 Clicks" Mobility   Outcome Measure  Help needed turning from your back to your side while in a flat bed without using bedrails?: A Lot Help needed moving from lying on your back to sitting on the side of a flat bed without using bedrails?: Total Help needed moving to and from a bed to a chair (including a wheelchair)?: Total Help needed standing up from a chair using your arms (e.g., wheelchair or bedside chair)?: Total Help needed to walk in hospital room?: Total Help needed climbing 3-5 steps with a railing? : Total 6 Click Score: 7    End of Session Equipment Utilized During Treatment: Oxygen Activity Tolerance: Patient tolerated treatment well;Patient limited by fatigue Patient left: in bed;with call bell/phone within reach Nurse Communication: Mobility status;Need for lift equipment PT Visit Diagnosis: Difficulty in walking, not elsewhere classified (R26.2);Muscle weakness (generalized) (M62.81);Other abnormalities of gait and mobility (R26.89)     Time: 7414-2395 PT Time Calculation (min) (ACUTE ONLY): 40 min  Charges:  $Therapeutic Activity: 23-37 mins                     Mabeline Caras, PT, DPT Acute Rehabilitation Services  Pager [phone] Office [place] 04/08/2021, 3:26 PM

## 2021-04-08 NOTE — Progress Notes (Signed)
Referred pt to Dr Cyd Silence thru secured message, discussed about HR = 120-160 bpm and was noticed it after amiodarone infusion was decreased rate from 60 mg/hr to 30 mg/hr, pt still stable alert oriented , SBP=100-120 mmhg, no complain of pain on the chest nor SOB still with O2 via [place],   DR Shalhoub ordered to  increase rate of Amiodarone to 60 mg/hr for another 6 hrs and observe pt response - done

## 2021-04-08 NOTE — Progress Notes (Signed)
Occupational Therapy Treatment Patient Details Name: Jose Fitzpatrick MRN: [id number] DOB: [birth year] Today's Date: 04/08/2021    History of present illness Pt is a 75 y.o. male admitted to [facility] on 04/01/2021 with BLE swelling, DOE, weaight gain. Workup for CHF exacerbation, aflutter/afib with RVR. S/p TEE guided DCCV 5/16 but quickly returned to afib. Pt with hypotension and transfer to [facility] on 5/21. S/p repeat DCCV 5/23. Converted back to AFL 5/24. 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   OT comments  Pt making incremental progress towards OT goals. Pt limited OOB activities this session due to dizziness and overall feeling nervous and unwell. Pt was educated on the importance of mobility and encouraged to work on getting EOB multiple times a day to work on progressing to transferring [place]. Pt agreeable to education. Acute OT to continue following to assist with progressing pts functional mobility and ADL performance.    Follow Up Recommendations  SNF    Equipment Recommendations  None recommended by OT    Recommendations for Other Services      Precautions / Restrictions Precautions Precautions: Fall;Other (comment) Precaution Comments: Watch HR (afib/aflutter in 120s at rest) Restrictions Weight Bearing Restrictions: No       Mobility Bed Mobility Overal bed mobility: Needs Assistance Bed Mobility: Rolling;Supine to Sit;Sit to Supine Rolling: Max assist   Supine to sit: Mod assist;+2 for physical assistance;HOB elevated Sit to supine: Mod assist;+2 for physical assistance   General bed mobility comments: Pt assisting well with LLE and BUE support (on rail) to initiate rolling, requiring maxA to achieve full pelvic rotation; heavy modA+2 to assist trunk elevation and scoot hips to EOB; modA+2 for BLE management and trunk control return to supine; pt able to scoot up with min-modA+2 with bUE support pulling on bed rails when bed flat and in trendelenberg position     Transfers                 General transfer comment: pt adamantly declining attempts at standing or transfers; reports "I need to lay down" secondary to 'feeling poorly' and 'dizziness'    Balance Overall balance assessment: Needs assistance Sitting-balance support: No upper extremity supported;Feet supported;Bilateral upper extremity supported Sitting balance-Leahy Scale: Fair Sitting balance - Comments: Initially requiring min-modA and UE support to maintain static sitting, progressing to min guard with and without UE support; quick to fatigue                                   ADL either performed or assessed with clinical judgement   ADL Overall ADL's : Needs assistance/impaired     Grooming: Wash/dry face;Set up;Sitting Grooming Details (indicate cue type and reason): completed EOB     Lower Body Bathing: Maximal assistance;Bed level Lower Body Bathing Details (indicate cue type and reason): Pt required assistance rolling and pt was unable to complete pericare                       General ADL Comments: Pt deferred OOB this session due to dizziness and nervousness with ambulating     Vision       Perception     Praxis      Cognition Arousal/Alertness: Awake/alert Behavior During Therapy: WFL for tasks assessed/performed;Anxious Overall Cognitive Status: Within Functional Limits for tasks assessed  General Comments: WFL for tasks; pt appears anxious with mobility, adamantly stating, "This is all I can do today" despite encouragement/education        Exercises     Shoulder Instructions       General Comments HR sustaining 125-129 during session (when telemonitor reading); seated BP 121/80; on 2L O2 [place]    Pertinent Vitals/ Pain       Pain Assessment: Faces Faces Pain Scale: Hurts a little bit Pain Location: RLE neuropathy Pain Descriptors / Indicators: Heaviness;Discomfort Pain  Intervention(s): Monitored during session;Limited activity within patient's tolerance  Home Living                                          Prior Functioning/Environment              Frequency  Min 2X/week        Progress Toward Goals  OT Goals(current goals can now be found in the care plan section)  Progress towards OT goals: Progressing toward goals  Acute Rehab OT Goals Patient Stated Goal: "I want to go home" OT Goal Formulation: With patient Time For Goal Achievement: 04/15/21 Potential to Achieve Goals: Good ADL Goals Pt Will Perform Lower Body Dressing: with min assist;with adaptive equipment;sit to/from stand Pt Will Transfer to Toilet: with min assist;bedside commode;stand pivot transfer Pt Will Perform Toileting - Clothing Manipulation and hygiene: with min assist;sit to/from stand Pt/caregiver will Perform Home Exercise Program: Both right and left upper extremity;Increased strength Additional ADL Goal #1: Patient will stand at sink to perform grooming task as evidence of improving activity tolerance  Plan Discharge plan remains appropriate;Frequency remains appropriate    Co-evaluation    PT/OT/SLP Co-Evaluation/Treatment: Yes Reason for Co-Treatment: For patient/therapist safety;To address functional/ADL transfers PT goals addressed during session: Mobility/safety with mobility;Balance OT goals addressed during session: ADL's and self-care      AM-PAC OT "6 Clicks" Daily Activity     Outcome Measure   Help from another person eating meals?: None Help from another person taking care of personal grooming?: A Little Help from another person toileting, which includes using toliet, bedpan, or urinal?: Total Help from another person bathing (including washing, rinsing, drying)?: A Lot Help from another person to put on and taking off regular upper body clothing?: A Little Help from another person to put on and taking off regular lower  body clothing?: Total 6 Click Score: 14    End of Session Equipment Utilized During Treatment: Oxygen  OT Visit Diagnosis: History of falling (Z91.81);Muscle weakness (generalized) (M62.81)   Activity Tolerance Patient tolerated treatment well   Patient Left in bed;with call bell/phone within reach   Nurse Communication Mobility status        Time: 3419-6222 OT Time Calculation (min): 29 min  Charges: OT General Charges $OT Visit: 1 Visit OT Treatments $Self Care/Home Management : 8-22 mins  Maira Christon H., OTR/L Acute Rehabilitation  Norvell Caswell Elane Yolanda Bonine 04/08/2021, 4:58 PM

## 2021-04-08 NOTE — Progress Notes (Signed)
Updated  Dr Cyd Silence about the v/s of pt after Amiodarone infusion  Increased to 60 mg/hr, still pt stable alert oriented not in distress no complain of chest pain with SBp=95-120 mmhg, MAP above 65 mmhg, HR=120-140 bpm, advised to observe and no further order was made

## 2021-04-09 DIAGNOSIS — I4891 Unspecified atrial fibrillation: Secondary | ICD-10-CM | POA: Diagnosis not present

## 2021-04-09 DIAGNOSIS — I5041 Acute combined systolic (congestive) and diastolic (congestive) heart failure: Secondary | ICD-10-CM | POA: Diagnosis not present

## 2021-04-09 LAB — COMPREHENSIVE METABOLIC PANEL
ALT: 25 U/L (ref 0–44)
AST: 27 U/L (ref 15–41)
Albumin: 2.2 g/dL — ABNORMAL LOW (ref 3.5–5.0)
Alkaline Phosphatase: 48 U/L (ref 38–126)
Anion gap: 6 (ref 5–15)
BUN: 26 mg/dL — ABNORMAL HIGH (ref 8–23)
CO2: 43 mmol/L — ABNORMAL HIGH (ref 22–32)
Calcium: 8 mg/dL — ABNORMAL LOW (ref 8.9–10.3)
Chloride: 84 mmol/L — ABNORMAL LOW (ref 98–111)
Creatinine, Ser: 1.24 mg/dL (ref 0.61–1.24)
GFR, Estimated: >60 mL/min (ref >60)
Glucose, Bld: 108 mg/dL — ABNORMAL HIGH (ref 70–99)
Potassium: 3.8 mmol/L (ref 3.5–5.1)
Sodium: 133 mmol/L — ABNORMAL LOW (ref 135–145)
Total Bilirubin: 1.3 mg/dL — ABNORMAL HIGH (ref 0.3–1.2)
Total Protein: 5.7 g/dL — ABNORMAL LOW (ref 6.5–8.1)

## 2021-04-09 LAB — CULTURE, BLOOD (ROUTINE X 2)
Culture: NO GROWTH
Culture: NO GROWTH

## 2021-04-09 LAB — MAGNESIUM: Magnesium: 2.1 mg/dL (ref 1.7–2.4)

## 2021-04-09 MED ORDER — ACETAZOLAMIDE 250 MG PO TABS
250.0000 mg | ORAL_TABLET | Freq: Two times a day (BID) | ORAL | Status: DC
  Administered 2021-04-09 – 2021-04-16 (×16): 250 mg via ORAL
  Filled 2021-04-09 (×18): qty 1

## 2021-04-09 MED ORDER — POTASSIUM CHLORIDE CRYS ER 20 MEQ PO TBCR
20.0000 meq | EXTENDED_RELEASE_TABLET | Freq: Two times a day (BID) | ORAL | Status: DC
  Administered 2021-04-09 (×2): 20 meq via ORAL
  Filled 2021-04-09 (×2): qty 1

## 2021-04-09 MED ORDER — FUROSEMIDE 10 MG/ML IJ SOLN
120.0000 mg | Freq: Two times a day (BID) | INTRAVENOUS | Status: DC
  Administered 2021-04-09 – 2021-04-15 (×13): 120 mg via INTRAVENOUS
  Filled 2021-04-09 (×3): qty 10
  Filled 2021-04-09: qty 12
  Filled 2021-04-09: qty 10
  Filled 2021-04-09: qty 12
  Filled 2021-04-09: qty 10
  Filled 2021-04-09: qty 12
  Filled 2021-04-09: qty 2
  Filled 2021-04-09: qty 10
  Filled 2021-04-09: qty 2
  Filled 2021-04-09 (×3): qty 10
  Filled 2021-04-09: qty 12
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
## 2021-04-09 NOTE — Progress Notes (Signed)
PROGRESS NOTE    Jose Fitzpatrick  CHE:[id number] DOB: [birth year] DOA: 03/29/2021 PCP: Tamsen Roers, MD    Brief Narrative:  Jose Fitzpatrick is a 75 year old male with past medical history significant for essential hypertension, morbid obesity, peripheral neuropathy who presented to the ED on 5/10 with shortness of breath and worsening lower extremity edema.  Patient reports onset of symptoms over the past 2 days with associated difficulty lying flat and sleeping on incline.  He also describes weeping of fluids on his lower extremities.  Denies chest pain, no palpitations or chest pain.  No previous cardiac history.  Denies heavy alcohol use or illicit drug use.  In the ED, patient was noted to be in atrial fibrillation with RVR with rates of 280.  BNP 88, high sensitive troponin 12.  Chest x-ray with mild cardiomegaly and vascular congestion/pulmonary edema.  Patient was given 20 mg IV diltiazem bolus and started on diltiazem infusion.  Patient was also given 60 mg of IV Lasix with over 1 L output.  Cardiology was consulted.  TRH consulted for further evaluation and management of new onset atrial fibrillation with RVR.   Assessment & Plan:   Principal Problem:   Atrial fibrillation with RVR (HCC) Active Problems:   HTN (hypertension)   Acute CHF (congestive heart failure) (HCC)   Acute respiratory failure with hypoxia (HCC)   Elevated TSH   BMI 50.0-59.9, adult (HCC)   Hypercapnic respiratory failure (HCC)   Pressure injury of skin   Atrial fibrillation with RVR, new diagnosis Patient presenting to ED with progressive shortness of breath and lower extremity edema.  On arrival, patient was noted to be in A. fib with RVR.  TSH 4.870 on 5/11.  Underwent DCCV 5/16 and 5/23, unfortunately reconverted back to A. fib on 5/24. --Amiodarone drip --Metoprolol tartrate 25 mg p.o. twice daily. --Digoxin 0.125 mg p.o. daily today --Eliquis 5 mg p.o. twice daily --EP recommends repeat DCCV  once patient euvolemic  New onset acute systolic congestive heart failure Acute hypoxic respiratory failure, POA Patient presenting to ED with progressive shortness of breath and lower extremity edema.  Hypoxic on presentation.  Chest x-ray with interstitial pulmonary edema and small bilateral pleural effusions.  TTE with LVEF 25-30%.  Suspect possibly related to arrhythmia. --net negative 646mL past 24 hrs and net negative 29.6L since admission --Furosemide 120 mg IV every 12 hours --Diamox 250mg  PO BID --Holding Aldactone and Entresto due to borderline hypotension --UNNA boots --Strict I's and O's and daily weights --Continue supplemental oxygen, maintain SPO2 greater than 92%, currently on 4 L nasal cannula --BMP daily  Acute renal failure --Cr 1.02>>>1.44>1.22>>1.04>1.15>1.13>1.24 --BMP daily  Hx Essential hypertension BP 99/61 this morning.  Likely complicated by persistent A. fib with RVR.  Required transfer to ICU on pressure support on 5/20; possibly due to aggressive diuresis in the setting of taking heart failure medications and A. fib with RVR. --Metoprolol tartrate 25 mg p.o. twice daily --Holding Aldactone and Entresto --Hope BP will improve with better rate control --Continue monitor BP closely  Subclinical hypothyroidism TSH slightly elevated 4.870 with normal free T3/4.  EtOH use disorder Counseled on need for complete cessation. --Thiamine, B12, multivitamin, folic acid  Morbid obesity Body mass index is 48.69 kg/m.  Discussed with patient needs for aggressive lifestyle changes/weight loss as this complicates all facets of care.  Outpatient follow-up with PCP.  May benefit from bariatric evaluation outpatient.  Pressure injury bilateral ears, POA Pressure Injury 04/05/21 Ear Left Stage 2 -  Partial thickness loss of dermis presenting as a shallow open injury with a red, pink wound bed without slough. from oxygen tubing (Active)  04/05/21 1312  Location: Ear   Location Orientation: Left  Staging: Stage 2 -  Partial thickness loss of dermis presenting as a shallow open injury with a red, pink wound bed without slough.  Wound Description (Comments): from oxygen tubing  Present on Admission: Yes     Pressure Injury 04/05/21 Ear Right Stage 2 -  Partial thickness loss of dermis presenting as a shallow open injury with a red, pink wound bed without slough. from oxygen tubing (Active)  04/05/21 1317  Location: Ear  Location Orientation: Right  Staging: Stage 2 -  Partial thickness loss of dermis presenting as a shallow open injury with a red, pink wound bed without slough.  Wound Description (Comments): from oxygen tubing  Present on Admission: Yes  --Continue local wound care  Weakness/deconditioning/debility: -- Continue PT/OT efforts while inpatient   DVT prophylaxis:  apixaban (ELIQUIS) tablet 5 mg    Code Status: DNR Family Communication: No family present at bedside this morning  Disposition Plan:  Level of care: Progressive Status is: Inpatient  Remains inpatient appropriate because:Ongoing diagnostic testing needed not appropriate for outpatient work up, Unsafe d/c plan, IV treatments appropriate due to intensity of illness or inability to take PO and Inpatient level of care appropriate due to severity of illness   Dispo: The patient is from: Home              Anticipated d/c is to: SNF              Patient currently is not medically stable to d/c.   Difficult to place patient No  Consultants:   Cardiology  Procedures:   TTE 5/13: LVEF 35%  TEE/DCCV 5/16  TEE/DCCV 5/23  Antimicrobials:   none   Subjective: Patient seen examined at bedside, resting comfortably.  Continues with mild shortness of breath although much improved since initial presentation. Continues with lower extremity edema.  Heart rate remains poorly controlled on amiodarone drip.  Cardiology consulting electrophysiology today.  No other questions or  concerns at this time.  Denies headache, no fever/chills/night sweats, no nausea/vomiting/diarrhea, no active chest pain, no palpitations, no current shortness of breath, no abdominal pain.  No other acute events overnight per nursing staff.  Objective: Vitals:   04/08/21 2048 04/09/21 0012 04/09/21 0513 04/09/21 0801  BP: (!) 118/58 126/90 128/78 114/71  Pulse: (!) 124 (!) 121 (!) 122 (!) 124  Resp: 17 14 16 17   Temp: 98.4 F (36.9 C) 98.3 F (36.8 C)  98 F (36.7 C)  TempSrc: Oral Oral Oral Oral  SpO2: 91% 96% 97% 93%  Weight:   (!) 176.7 kg   Height:        Intake/Output Summary (Last 24 hours) at 04/09/2021 1443 Last data filed at 04/09/2021 1149 Gross per 24 hour  Intake 984.53 ml  Output 500 ml  Net 484.53 ml   Filed Weights   04/07/21 0500 04/08/21 0407 04/09/21 0513  Weight: (!) 171.8 kg (!) 173.7 kg (!) 176.7 kg    Examination:  General exam: Appears calm and comfortable, obese, chronically ill in appearance Respiratory system: Decreased breath sounds bilateral bases without crackles/wheezing, normal [place] effort, on 2 L nasal cannula with SPO2 96% Cardiovascular system: S1 & S2 heard, irregularly irregular rhythm, tachycardic. No JVD, murmurs, rubs, gallops or clicks. No pedal edema. Gastrointestinal system: Abdomen is nondistended, soft  and nontender. No organomegaly or masses felt. Normal bowel sounds heard. Central nervous system: Alert and oriented. No focal neurological deficits. Extremities: Symmetric 5 x 5 power. Skin: No rashes, lesions or ulcers Psychiatry: Judgement and insight appear poor. Mood & affect appropriate.     Data Reviewed: I have personally reviewed following labs and imaging studies  CBC: Recent Labs  Lab 04/03/21 0601 04/04/21 0500 04/05/21 0355 03/29/2021 0302  WBC 7.4 7.6 8.3 7.3  HGB 16.4 17.8* 16.5 16.8  HCT 51.1 54.9* 49.4 51.9  MCV 102.4* 100.9* 99.0 99.4  PLT 176 174 136* 569   Basic Metabolic Panel: Recent Labs  Lab  04/05/21 0355 04/05/21 1509 04/10/2021 0302 04/07/21 0213 04/08/21 0459 04/09/21 0410  NA  --  132* 132* 136 136 133*  K  --  4.3 4.1 4.5 4.1 3.8  CL  --  83* 86* 86* 87* 84*  CO2  --  40* 40* 42* 39* 43*  GLUCOSE  --  208* 97 105* 103* 108*  BUN  --  29* 24* 20 22 26*  CREATININE  --  1.12 1.11 1.15 1.13 1.24  CALCIUM  --  8.0* 8.4* 8.6* 8.4* 8.0*  MG 1.9 2.0 2.1  --  1.9 2.1  PHOS 2.8 3.1 3.0  --   --   --    GFR: Estimated Creatinine Clearance: 88.4 mL/min (by C-G formula based on SCr of 1.24 mg/dL). Liver Function Tests: Recent Labs  Lab 04/09/21 0410  AST 27  ALT 25  ALKPHOS 48  BILITOT 1.3*  PROT 5.7*  ALBUMIN 2.2*   No results for input(s): LIPASE, AMYLASE in the last 168 hours. No results for input(s): AMMONIA in the last 168 hours. Coagulation Profile: Recent Labs  Lab 04/07/2021 0734  INR 1.4*   Cardiac Enzymes: No results for input(s): CKTOTAL, CKMB, CKMBINDEX, TROPONINI in the last 168 hours. BNP (last 3 results) No results for input(s): PROBNP in the last 8760 hours. HbA1C: No results for input(s): HGBA1C in the last 72 hours. CBG: No results for input(s): GLUCAP in the last 168 hours. Lipid Profile: No results for input(s): CHOL, HDL, LDLCALC, TRIG, CHOLHDL, LDLDIRECT in the last 72 hours. Thyroid Function Tests: No results for input(s): TSH, T4TOTAL, FREET4, T3FREE, THYROIDAB in the last 72 hours. Anemia Panel: No results for input(s): VITAMINB12, FOLATE, FERRITIN, TIBC, IRON, RETICCTPCT in the last 72 hours. Sepsis Labs: Recent Labs  Lab 04/04/21 1118 04/04/21 1238 04/04/21 1441 04/05/21 0355 03/16/2021 0302  PROCALCITON  --  <0.10  --  <0.10 <0.10  LATICACIDVEN 1.0  --  1.0  --   --     Recent Results (from the past 240 hour(s))  Culture, blood (routine x 2)     Status: None   Collection Time: 04/04/21  1:27 PM   Specimen: BLOOD  Result Value Ref Range Status   Specimen Description   Final    BLOOD LEFT ANTECUBITAL Performed at [facility], [place] [address], [place], [place] [address]    Special Requests   Final    BOTTLES DRAWN AEROBIC ONLY Blood Culture results may not be optimal due to an excessive volume of blood received in culture bottles Performed at [place] [address], [place], [place] [address]    Culture   Final    NO GROWTH 5 DAYS Performed at [facility], [place] [address] [place], [place] [address]    Report Status 04/09/2021 FINAL  Final  Culture, blood (routine x 2)     Status: None   Collection Time: 04/04/21  1:27 PM   Specimen: BLOOD  Result Value Ref Range Status   Specimen Description   Final    BLOOD LEFT ANTECUBITAL Performed at [place] [address] [place], [place] [address]    Special Requests   Final    BOTTLES DRAWN AEROBIC ONLY Blood Culture results may not be optimal due to an excessive volume of blood received in culture bottles Performed at [place] [address], [place], [place] [address]    Culture   Final    NO GROWTH 5 DAYS Performed at [facility], [place] [address] [place], [place] [address]    Report Status 04/09/2021 FINAL  Final  Culture, Urine     Status: Abnormal   Collection Time: 04/04/21  6:43 PM   Specimen: Urine, Clean Catch  Result Value Ref Range Status   Specimen Description   Final    URINE, CLEAN CATCH Performed at [facility], [place] [address] [place], [place] [address]    Special Requests   Final    NONE Performed at [facility], [place] [address] [place],  [address]    Culture (A)  Final    >=100,000 COLONIES/mL ESCHERICHIA COLI 80,000 COLONIES/mL ENTEROCOCCUS FAECALIS Confirmed Extended Spectrum Beta-Lactamase Producer (ESBL).  In bloodstream infections from ESBL organisms, carbapenems are preferred over piperacillin/tazobactam. They are shown to have a lower risk of mortality.    Report Status  04/07/2021 FINAL  Final   Organism ID, Bacteria ESCHERICHIA COLI (A)  Final   Organism ID, Bacteria ENTEROCOCCUS FAECALIS (A)  Final      Susceptibility   Escherichia coli - MIC*    AMPICILLIN >=32 RESISTANT Resistant     CEFAZOLIN >=64 RESISTANT Resistant     CEFEPIME 8 INTERMEDIATE Intermediate     CEFTRIAXONE >=64 RESISTANT Resistant     CIPROFLOXACIN <=0.25 SENSITIVE Sensitive     GENTAMICIN <=1 SENSITIVE Sensitive     IMIPENEM <=0.25 SENSITIVE Sensitive     NITROFURANTOIN <=16 SENSITIVE Sensitive     TRIMETH/SULFA <=20 SENSITIVE Sensitive     AMPICILLIN/SULBACTAM >=32 RESISTANT Resistant     PIP/TAZO <=4 SENSITIVE Sensitive     * >=100,000 COLONIES/mL ESCHERICHIA COLI   Enterococcus faecalis - MIC*    AMPICILLIN <=2 SENSITIVE Sensitive     NITROFURANTOIN <=16 SENSITIVE Sensitive     VANCOMYCIN 1 SENSITIVE Sensitive     * 80,000 COLONIES/mL ENTEROCOCCUS FAECALIS         Radiology Studies: [place] EKG SITE RITE  Result Date: 04/08/2021 If Site Rite image not attached, placement could not be confirmed due to current cardiac rhythm.       Scheduled Meds: . acetaZOLAMIDE  250 mg Oral BID  . apixaban  5 mg Oral BID  . bacitracin   Topical BID  . Chlorhexidine Gluconate Cloth  6 each Topical Daily  . digoxin  0.125 mg Oral Daily  . folic acid  1 mg Oral Daily  . gabapentin  600 mg Oral QPM  . mouth rinse  15 mL Mouth Rinse BID  . metoprolol tartrate  25 mg Oral BID  . multivitamin with minerals  1 tablet Oral Daily  . potassium chloride  20 mEq Oral BID  . sodium chloride flush  10-40 mL Intracatheter Q12H  . testosterone cypionate  200 mg Intramuscular Q14 Days  . thiamine  100 mg Oral Daily   Or  . thiamine  100 mg Intravenous Daily  . vitamin B-12  1,000 mcg Oral Daily   Continuous Infusions: . sodium chloride Stopped (04/04/21 0949)  . sodium chloride    . amiodarone 30 mg/hr (04/09/21 1149)  . furosemide Stopped (04/09/21 1143)     LOS: 16 days    Time  spent: 39 minutes spent on chart review, discussion with nursing staff, consultants, updating family and interview/physical exam; more than 50% of that time was spent in counseling and/or coordination of care.    Mairany Bruno J [place] DO [facility] Available via Epic secure chat 7am-7pm After these hours, please refer to coverage provider listed on [email or web address] 04/09/2021, 2:43 PM

## 2021-04-09 NOTE — Progress Notes (Signed)
Amiodarone Drug - Drug Interaction Consult Note  Recommendations:  Monitor QTc, lytes  Amiodarone is metabolized by the cytochrome P450 system and therefore has the potential to cause many drug interactions. Amiodarone has an average plasma half-life of 50 days (range 20 to 100 days).   There is potential for drug interactions to occur several weeks or months after stopping treatment and the onset of drug interactions may be slow after initiating amiodarone.   []  Statins: Increased risk of myopathy. Simvastatin- restrict dose to 20mg  daily. Other statins: counsel patients to report any muscle pain or weakness immediately.  []  Anticoagulants: Amiodarone can increase anticoagulant effect. Consider warfarin dose reduction. Patients should be monitored closely and the dose of anticoagulant altered accordingly, remembering that amiodarone levels take several weeks to stabilize.  []  Antiepileptics: Amiodarone can increase plasma concentration of phenytoin, the dose should be reduced. Note that small changes in phenytoin dose can result in large changes in levels. Monitor patient and counsel on signs of toxicity.  [x]  Beta blockers: increased risk of bradycardia, AV block and myocardial depression. Sotalol - avoid concomitant use. - Metoprolol OK  []   Calcium channel blockers (diltiazem and verapamil): increased risk of bradycardia, AV block and myocardial depression.  []   Cyclosporine: Amiodarone increases levels of cyclosporine. Reduced dose of cyclosporine is recommended.  [x]  Digoxin dose should be halved when amiodarone is started. - Re-starting digoxin monitoring electrolytes and response to prevent toxicity  [x]  Diuretics: increased risk of cardiotoxicity if hypokalemia occurs. - Replacing K  []  Oral hypoglycemic agents (glyburide, glipizide, glimepiride): increased risk of hypoglycemia. Patient's glucose levels should be monitored closely when initiating amiodarone therapy.   []  Drugs  that prolong the QT interval:  Torsades de pointes risk may be increased with concurrent use - avoid if possible.  Monitor QTc, also keep magnesium/potassium WNL if concurrent therapy can't be avoided. [place] Kitchen Antibiotics: e.g. fluoroquinolones, erythromycin. . Antiarrhythmics: e.g. quinidine, procainamide, disopyramide, sotalol. . Antipsychotics: e.g. phenothiazines, haloperidol.  . Lithium, tricyclic antidepressants, and methadone.  Thank You,  Lorelei Pont, PharmD, [facility] Clinical Pharmacist 04/09/2021 9:16 AM

## 2021-04-09 NOTE — Progress Notes (Addendum)
Progress Note   Subjective   Denies any chest pain or dyspnea.  Reports lightheadedness.  Inpatient Medications    Scheduled Meds: . apixaban  5 mg Oral BID  . bacitracin   Topical BID  . Chlorhexidine Gluconate Cloth  6 each Topical Daily  . digoxin  0.125 mg Oral Daily  . folic acid  1 mg Oral Daily  . furosemide  80 mg Intravenous BID  . gabapentin  600 mg Oral QPM  . mouth rinse  15 mL Mouth Rinse BID  . metoprolol tartrate  25 mg Oral BID  . multivitamin with minerals  1 tablet Oral Daily  . potassium chloride  20 mEq Oral Daily  . sodium chloride flush  10-40 mL Intracatheter Q12H  . testosterone cypionate  200 mg Intramuscular Q14 Days  . thiamine  100 mg Oral Daily   Or  . thiamine  100 mg Intravenous Daily  . vitamin B-12  1,000 mcg Oral Daily   Continuous Infusions: . sodium chloride Stopped (04/04/21 0949)  . sodium chloride    . amiodarone 30 mg/hr (04/08/21 2222)   PRN Meds: sodium chloride, Place/Maintain arterial line **AND** sodium chloride, acetaminophen, docusate sodium, polyethylene glycol, sodium chloride flush   Vital Signs    Vitals:   04/08/21 2048 04/09/21 0012 04/09/21 0513 04/09/21 0801  BP: (!) 118/58 126/90 128/78 114/71  Pulse: (!) 124 (!) 121 (!) 122 (!) 124  Resp: 17 14 16 17   Temp: 98.4 F (36.9 C) 98.3 F (36.8 C)  98 F (36.7 C)  TempSrc: Oral Oral Oral Oral  SpO2: 91% 96% 97% 93%  Weight:   (!) 176.7 kg   Height:        Intake/Output Summary (Last 24 hours) at 04/09/2021 8338 Last data filed at 04/08/2021 2505 Gross per 24 hour  Intake 284.06 ml  Output 500 ml  Net -215.94 ml   Filed Weights   04/07/21 0500 04/08/21 0407 04/09/21 0513  Weight: (!) 171.8 kg (!) 173.7 kg (!) 176.7 kg    Telemetry    AFL 120s- Personally Reviewed  Physical Exam  GEN: in no acute distress HEENT: normal Neck: no JVD appreciated but difficult to assess given habitus Cardiac: regular, tachycardic, no murmurs Respiratory:   Diminished breath sounds at bsases GI: soft, nontender, nondistended MS: venous stasis changes, 1+ edema Skin: warm and dry, no rash Neuro:  Alert and Oriented x 3 Psych: normal affect    Labs    Chemistry Recent Labs  Lab 04/07/21 0213 04/08/21 0459 04/09/21 0410  NA 136 136 133*  K 4.5 4.1 3.8  CL 86* 87* 84*  CO2 42* 39* 43*  GLUCOSE 105* 103* 108*  BUN 20 22 26*  CREATININE 1.15 1.13 1.24  CALCIUM 8.6* 8.4* 8.0*  PROT  --   --  5.7*  ALBUMIN  --   --  2.2*  AST  --   --  27  ALT  --   --  25  ALKPHOS  --   --  48  BILITOT  --   --  1.3*  GFRNONAA >60 >60 >60  ANIONGAP 8 10 6      Hematology Recent Labs  Lab 04/04/21 0500 04/05/21 0355 04/13/2021 0302  WBC 7.6 8.3 7.3  RBC 5.44 4.99 5.22  HGB 17.8* 16.5 16.8  HCT 54.9* 49.4 51.9  MCV 100.9* 99.0 99.4  MCH 32.7 33.1 32.2  MCHC 32.4 33.4 32.4  RDW 13.1 12.8 12.9  PLT 174  136* 174     Patient ID  Jose Michel Carpenteris a 75 y.o.malewith hypertension, morbid obesity, chronic venous insufficiency, possible alcohol abuse who was admitted on 04/09/2021 with shortness of breath and acute decompensated systolic heart failure. Found to be in atrial flutter as well as afib with RVR.   Assessment & Plan    Atrial fibrillation/ atrial flutter with RVR: Underwent TEE guided [facility] 03/26/2021 but quickly returned to afib. Loaded with amio and underwent repeat DCCV on 5/23.  Converted back to AFL on 5/24 -Continue amio gtt -Continue metprolol 25 mg BID, titration limited by soft pressures -Continue eliquis.   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 No evidence of infection, procalcition <0.1.  Lactate 1.0.  Co-ox on  5/20 67%, not consistent with cardiogenic shock.  Possibly due to aggressive diuresis in setting of taking heart failure meds and AF with RVR contributing, held HF meds/diuresis and underwent DCCV 5/24.  Hypotension has resolved, but BP soft again since back in AFL.  Improved today, normotensive  New onset CHF: EF 25-30%, new diagnosis.  Likely tachycardia mediated.  Suspect volume overload is multifactorial, low albumin (2.2) also contributing -He has been aggressively diuresed, net negative 30L on admission.  Currently on IV lasix 80 mg BID.  Net negative 680cc yesterday, but I/Os incomplete.  Weights have been very labile but trending up. Stable creatinine (1.1 > 1.2).  CVP 11 yesterday, up to 17 this AM, will increase lasix to 120 mg BID -Developed hypotension earlier this admission on entresto 24-26 mg BID, aldactone 25 mg daily, metoprolol 25 mg qid.  Holding aldactone and entresto. -Developing metabolic alkalosis, will add diamox 250 mg BID (sulfa allergy listed, discussed with pharmacy, [place] to use)   [name], MD

## 2021-04-09 NOTE — Progress Notes (Addendum)
Heart Failure Stewardship Pharmacist Progress Note   PCP: Tamsen Roers, MD PCP-Cardiologist: Kirk Ruths, MD    HPI:  75 YO male with PMH significant for HTN, morbid obesity, possible alcohol abuse. Admitted 03/27/2021 with shortness of breath and acute decompensated systolic HF; also in atrial flutter and afib with RVR. Underwent TEE guided [facility], however afib returned. Repeat was successful and converted back to AFL 5/24. Newly diagnosed CHF with EF of 25-30%.   Current HF Medications: Furosemide 120 mg IV BID Metoprolol tartrate 25 mg BID Digoxin 0.125 mg daily   Prior to admission HF Medications: Metoprolol succinate 50 mg daily  Lisinopril-HCTZ 20-25 mg    Pertinent Lab Values: Serum creatinine 1.24<<1.13, BUN 26, Potassium 3.8, Sodium 133, BNP 77.5, Magnesium 2.1, Digoxin 0.3 (5/22)   Vital Signs: Weight: 382 lbs (admission weight: 400 lbs) Blood pressure: 110-120s/70s Heart rate: 120s    Medication Assistance / Insurance Benefits Check: Does the patient have prescription insurance?  Yes Type of insurance plan: [employer] Medicare  Does the patient qualify for medication assistance through manufacturers or grants?   Pending   Outpatient Pharmacy:  [facility] Is the patient willing to use [facility] at discharge? Pending Is the patient willing to transition their outpatient pharmacy to utilize a [facility] outpatient pharmacy?   Pending    Assessment: 1. Acute on chronic systolic CHF (EF 16-01%). NYHA class II/III symptoms. - Agree with increasing furosemide to 120 mg IV BID; difficult to assess volume status due to BMI, however, +1 pitting edema on MD exam this AM; renal function stable, urine output documentation limited (2L yesterday) - Continue metoprolol tartrate 25 mg BID - consider transitioning to XL formulation prior to discharge  - Holding Entresto and spironolactone for now - developed hypotension earlier in admission  after administration of these agents; consider restarting prior to discharge as patient can tolerate; Please note that patient was taking lisinopril-hctz PTA - Holding off on SGLT2i for now - UA positive for E coli, may exacerbate. Consider starting in future if found appropriate. - Continue Digoxin 0.125 mg daily - level scheduled for tomorrow   Plan: 1) Medication changes recommended at this time: - No recommendations to make at this time   2) Patient assistance: - Jardiance Copay: $45/month - Wilder Glade Copay: $95/month - Unable to complete copay test for Entresto due to recent fill of lisinopril   3)  Education  - To be completed prior to discharge  Harriet Pho, PharmD PGY-1 Community Pharmacy Resident  04/09/2021 11:19 AM   Kerby Nora, PharmD, BCPS Heart Failure Stewardship Pharmacist Phone [phone]

## 2021-04-10 DIAGNOSIS — I4891 Unspecified atrial fibrillation: Secondary | ICD-10-CM | POA: Diagnosis not present

## 2021-04-10 DIAGNOSIS — I5041 Acute combined systolic (congestive) and diastolic (congestive) heart failure: Secondary | ICD-10-CM | POA: Diagnosis not present

## 2021-04-10 LAB — BASIC METABOLIC PANEL WITH GFR
Anion gap: 6 (ref 5–15)
BUN: 24 mg/dL — ABNORMAL HIGH (ref 8–23)
CO2: 42 mmol/L — ABNORMAL HIGH (ref 22–32)
Calcium: 8.1 mg/dL — ABNORMAL LOW (ref 8.9–10.3)
Chloride: 84 mmol/L — ABNORMAL LOW (ref 98–111)
Creatinine, Ser: 1.16 mg/dL (ref 0.61–1.24)
GFR, Estimated: >60 mL/min
Glucose, Bld: 114 mg/dL — ABNORMAL HIGH (ref 70–99)
Potassium: 3.4 mmol/L — ABNORMAL LOW (ref 3.5–5.1)
Sodium: 132 mmol/L — ABNORMAL LOW (ref 135–145)

## 2021-04-10 LAB — DIGOXIN LEVEL: Digoxin Level: <0.2 ng/mL — ABNORMAL LOW (ref 0.8–2.0)

## 2021-04-10 LAB — MAGNESIUM: Magnesium: 2.1 mg/dL (ref 1.7–2.4)

## 2021-04-10 LAB — GLUCOSE, CAPILLARY: Glucose-Capillary: 121 mg/dL — ABNORMAL HIGH (ref 70–99)

## 2021-04-10 MED ORDER — POTASSIUM CHLORIDE CRYS ER 20 MEQ PO TBCR
40.0000 meq | EXTENDED_RELEASE_TABLET | Freq: Two times a day (BID) | ORAL | Status: DC
  Administered 2021-04-10 – 2021-04-16 (×14): 40 meq via ORAL
  Filled 2021-04-10 (×14): qty 2

## 2021-04-10 MED ORDER — POTASSIUM CHLORIDE CRYS ER 20 MEQ PO TBCR
20.0000 meq | EXTENDED_RELEASE_TABLET | Freq: Once | ORAL | Status: AC
  Administered 2021-04-10: 20 meq via ORAL
  Filled 2021-04-10: qty 1

## 2021-04-10 MED ORDER — DIGOXIN 250 MCG PO TABS
0.2500 mg | ORAL_TABLET | Freq: Every day | ORAL | Status: DC
  Administered 2021-04-10 – 2021-04-15 (×6): 0.25 mg via ORAL
  Filled 2021-04-10 (×7): qty 1

## 2021-04-10 NOTE — Progress Notes (Signed)
PROGRESS NOTE    Jose Fitzpatrick  UDJ:[id number] DOB: [birth year] DOA: 03/22/2021 PCP: Tamsen Roers, MD    Brief Narrative:  Jose Fitzpatrick is a 75 year old male with past medical history significant for essential hypertension, morbid obesity, peripheral neuropathy who presented to the ED on 5/10 with shortness of breath and worsening lower extremity edema.  Patient reports onset of symptoms over the past 2 days with associated difficulty lying flat and sleeping on incline.  He also describes weeping of fluids on his lower extremities.  Denies chest pain, no palpitations or chest pain.  No previous cardiac history.  Denies heavy alcohol use or illicit drug use.  In the ED, patient was noted to be in atrial fibrillation with RVR with rates of 280.  BNP 88, high sensitive troponin 12.  Chest x-ray with mild cardiomegaly and vascular congestion/pulmonary edema.  Patient was given 20 mg IV diltiazem bolus and started on diltiazem infusion.  Patient was also given 60 mg of IV Lasix with over 1 L output.  Cardiology was consulted.  TRH consulted for further evaluation and management of new onset atrial fibrillation with RVR.   Assessment & Plan:   Principal Problem:   Atrial fibrillation with RVR (HCC) Active Problems:   HTN (hypertension)   Acute CHF (congestive heart failure) (HCC)   Acute respiratory failure with hypoxia (HCC)   Elevated TSH   BMI 50.0-59.9, adult (HCC)   Hypercapnic respiratory failure (HCC)   Pressure injury of skin   Atrial fibrillation with RVR, new diagnosis Patient presenting to ED with progressive shortness of breath and lower extremity edema.  On arrival, patient was noted to be in A. fib with RVR.  TSH 4.870 on 5/11.  Underwent DCCV 5/16 and 5/23, unfortunately reconverted back to A. fib on 5/24. --Amiodarone drip --Metoprolol tartrate 25 mg p.o. twice daily. --Digoxin increased to 0.25 mg p.o. daily today --Eliquis 5 mg p.o. twice daily --EP recommends  repeat DCCV once patient euvolemic  New onset acute systolic congestive heart failure Acute hypoxic respiratory failure, POA Patient presenting to ED with progressive shortness of breath and lower extremity edema.  Hypoxic on presentation.  Chest x-ray with interstitial pulmonary edema and small bilateral pleural effusions.  TTE with LVEF 25-30%.  Suspect possibly related to arrhythmia. --net negative 3L past 24 hrs and net negative 32.7L since admission --wt 181.4>>184.7>>166.7>>176.7>175.3kg --Furosemide 120 mg IV every 12 hours --Diamox 250mg  PO BID --Holding Aldactone and Entresto due to borderline hypotension --UNNA boots --Strict I's and O's and daily weights --Continue supplemental oxygen, maintain SPO2 greater than 92%, currently on 4 L nasal cannula --BMP daily  Acute renal failure --Cr 1.02>>>1.44>1.22>>1.04>1.15>1.13>1.24>1.16 --BMP daily  Hx Essential hypertension BP 99/61 this morning.  Likely complicated by persistent A. fib with RVR.  Required transfer to ICU on pressure support on 5/20; possibly due to aggressive diuresis in the setting of taking heart failure medications and A. fib with RVR. --Metoprolol tartrate 25 mg p.o. twice daily --Holding Aldactone and Entresto --Hope BP will improve with better rate control --Continue monitor BP closely  Subclinical hypothyroidism TSH slightly elevated 4.870 with normal free T3/4.  EtOH use disorder Counseled on need for complete cessation. --Thiamine, B12, multivitamin, folic acid  Morbid obesity Body mass index is 48.3 kg/m.  Discussed with patient needs for aggressive lifestyle changes/weight loss as this complicates all facets of care.  Outpatient follow-up with PCP.  May benefit from bariatric evaluation outpatient.  Pressure injury bilateral ears, POA Pressure Injury 04/05/21  Ear Left Stage 2 -  Partial thickness loss of dermis presenting as a shallow open injury with a red, pink wound bed without slough. from  oxygen tubing (Active)  04/05/21 1312  Location: Ear  Location Orientation: Left  Staging: Stage 2 -  Partial thickness loss of dermis presenting as a shallow open injury with a red, pink wound bed without slough.  Wound Description (Comments): from oxygen tubing  Present on Admission: Yes     Pressure Injury 04/05/21 Ear Right Stage 2 -  Partial thickness loss of dermis presenting as a shallow open injury with a red, pink wound bed without slough. from oxygen tubing (Active)  04/05/21 1317  Location: Ear  Location Orientation: Right  Staging: Stage 2 -  Partial thickness loss of dermis presenting as a shallow open injury with a red, pink wound bed without slough.  Wound Description (Comments): from oxygen tubing  Present on Admission: Yes  --Continue local wound care  Weakness/deconditioning/debility: --Continue PT/OT efforts while inpatient   DVT prophylaxis:  apixaban (ELIQUIS) tablet 5 mg    Code Status: DNR Family Communication: No family present at bedside this morning  Disposition Plan:  Level of care: Progressive Status is: Inpatient  Remains inpatient appropriate because:Ongoing diagnostic testing needed not appropriate for outpatient work up, Unsafe d/c plan, IV treatments appropriate due to intensity of illness or inability to take PO and Inpatient level of care appropriate due to severity of illness   Dispo: The patient is from: Home              Anticipated d/c is to: SNF              Patient currently is not medically stable to d/c.   Difficult to place patient No  Consultants:   Cardiology  Procedures:   TTE 5/13: LVEF 35%  TEE/DCCV 5/16  TEE/DCCV 5/23  Antimicrobials:   none   Subjective: Patient seen examined at bedside, resting comfortably.  Dyspnea improved today.  Net -3 L past 24 hours and weight down 4 pounds as well.  Cardiology increasing digoxin today.  No other questions or concerns at this time.  Denies headache, no fever/chills/night  sweats, no nausea/vomiting/diarrhea, no active chest pain, no palpitations, no current shortness of breath, no abdominal pain.  No other acute events overnight per nursing staff.  Objective: Vitals:   04/10/21 0030 04/10/21 0431 04/10/21 0924 04/10/21 0943  BP:  111/64 (!) 127/51 110/74  Pulse:  72 94 (!) 138  Resp:  (!) 22 18   Temp:  97.7 F (36.5 C) 97.9 F (36.6 C)   TempSrc:  Oral Oral   SpO2: 95% 97%    Weight:  (!) 175.3 kg    Height:        Intake/Output Summary (Last 24 hours) at 04/10/2021 1349 Last data filed at 04/10/2021 0631 Gross per 24 hour  Intake 240 ml  Output 4025 ml  Net -3785 ml   Filed Weights   04/08/21 0407 04/09/21 0513 04/10/21 0431  Weight: (!) 173.7 kg (!) 176.7 kg (!) 175.3 kg    Examination:  General exam: Appears calm and comfortable, obese, chronically ill in appearance Respiratory system: Decreased breath sounds bilateral bases without crackles/wheezing, normal Respaire effort, on 3.5 L nasal cannula with SPO2 97% Cardiovascular system: S1 & S2 heard, irregularly irregular rhythm, tachycardic. No JVD, murmurs, rubs, gallops or clicks.  Significant pedal edema up to thighs with Unna boots in place Gastrointestinal system: Abdomen is nondistended, soft  and nontender. No organomegaly or masses felt. Normal bowel sounds heard. Central nervous system: Alert and oriented. No focal neurological deficits. Extremities: Symmetric 5 x 5 power.  Unna boots in place. Skin: No rashes, lesions or ulcers Psychiatry: Judgement and insight appear poor. Mood & affect appropriate.     Data Reviewed: I have personally reviewed following labs and imaging studies  CBC: Recent Labs  Lab 04/04/21 0500 04/05/21 0355 03/20/2021 0302  WBC 7.6 8.3 7.3  HGB 17.8* 16.5 16.8  HCT 54.9* 49.4 51.9  MCV 100.9* 99.0 99.4  PLT 174 136* 671   Basic Metabolic Panel: Recent Labs  Lab 04/05/21 0355 04/05/21 1509 04/07/2021 0302 04/07/21 0213 04/08/21 0459  04/09/21 0410 04/10/21 0434  NA  --  132* 132* 136 136 133* 132*  K  --  4.3 4.1 4.5 4.1 3.8 3.4*  CL  --  83* 86* 86* 87* 84* 84*  CO2  --  40* 40* 42* 39* 43* 42*  GLUCOSE  --  208* 97 105* 103* 108* 114*  BUN  --  29* 24* 20 22 26* 24*  CREATININE  --  1.12 1.11 1.15 1.13 1.24 1.16  CALCIUM  --  8.0* 8.4* 8.6* 8.4* 8.0* 8.1*  MG 1.9 2.0 2.1  --  1.9 2.1 2.1  PHOS 2.8 3.1 3.0  --   --   --   --    GFR: Estimated Creatinine Clearance: 94 mL/min (by C-G formula based on SCr of 1.16 mg/dL). Liver Function Tests: Recent Labs  Lab 04/09/21 0410  AST 27  ALT 25  ALKPHOS 48  BILITOT 1.3*  PROT 5.7*  ALBUMIN 2.2*   No results for input(s): LIPASE, AMYLASE in the last 168 hours. No results for input(s): AMMONIA in the last 168 hours. Coagulation Profile: Recent Labs  Lab 03/20/2021 0734  INR 1.4*   Cardiac Enzymes: No results for input(s): CKTOTAL, CKMB, CKMBINDEX, TROPONINI in the last 168 hours. BNP (last 3 results) No results for input(s): PROBNP in the last 8760 hours. HbA1C: No results for input(s): HGBA1C in the last 72 hours. CBG: No results for input(s): GLUCAP in the last 168 hours. Lipid Profile: No results for input(s): CHOL, HDL, LDLCALC, TRIG, CHOLHDL, LDLDIRECT in the last 72 hours. Thyroid Function Tests: No results for input(s): TSH, T4TOTAL, FREET4, T3FREE, THYROIDAB in the last 72 hours. Anemia Panel: No results for input(s): VITAMINB12, FOLATE, FERRITIN, TIBC, IRON, RETICCTPCT in the last 72 hours. Sepsis Labs: Recent Labs  Lab 04/04/21 1118 04/04/21 1238 04/04/21 1441 04/05/21 0355 04/02/2021 0302  PROCALCITON  --  <0.10  --  <0.10 <0.10  LATICACIDVEN 1.0  --  1.0  --   --     Recent Results (from the past 240 hour(s))  Culture, blood (routine x 2)     Status: None   Collection Time: 04/04/21  1:27 PM   Specimen: BLOOD  Result Value Ref Range Status   Specimen Description   Final    BLOOD LEFT ANTECUBITAL Performed at [facility], [place] [address] [place], [place] [address]    Special Requests   Final    BOTTLES DRAWN AEROBIC ONLY Blood Culture results may not be optimal due to an excessive volume of blood received in culture bottles Performed at [facility] [address] [place], [place] [address]    Culture   Final    NO GROWTH 5 DAYS Performed at [facility], [place],  [place] [address]    Report Status 04/09/2021 FINAL  Final  Culture, blood (routine x 2)     Status: None   Collection Time: 04/04/21  1:27 PM   Specimen: BLOOD  Result Value Ref Range Status   Specimen Description   Final    BLOOD LEFT ANTECUBITAL Performed at [place] [address], [place], [place] [address]    Special Requests   Final    BOTTLES DRAWN AEROBIC ONLY Blood Culture results may not be optimal due to an excessive volume of blood received in culture bottles Performed at [place] [address] [place], [place] [address]    Culture   Final    NO GROWTH 5 DAYS Performed at [facility], [place] [address] [place], [place] [address]    Report Status 04/09/2021 FINAL  Final  Culture, Urine     Status: Abnormal   Collection Time: 04/04/21  6:43 PM   Specimen: Urine, Clean Catch  Result Value Ref Range Status   Specimen Description   Final    URINE, CLEAN CATCH Performed at [facility], [place] [address], [place], [place] [address]    Special Requests   Final    NONE Performed at [facility], [place] [address] [place], [place] [address]    Culture (A)  Final    >=100,000 COLONIES/mL ESCHERICHIA COLI 80,000 COLONIES/mL ENTEROCOCCUS FAECALIS Confirmed Extended Spectrum Beta-Lactamase Producer (ESBL).  In bloodstream infections from ESBL organisms, carbapenems are preferred over piperacillin/tazobactam. They are shown to have a lower risk of mortality.    Report Status 04/07/2021 FINAL   Final   Organism ID, Bacteria ESCHERICHIA COLI (A)  Final   Organism ID, Bacteria ENTEROCOCCUS FAECALIS (A)  Final      Susceptibility   Escherichia coli - MIC*    AMPICILLIN >=32 RESISTANT Resistant     CEFAZOLIN >=64 RESISTANT Resistant     CEFEPIME 8 INTERMEDIATE Intermediate     CEFTRIAXONE >=64 RESISTANT Resistant     CIPROFLOXACIN <=0.25 SENSITIVE Sensitive     GENTAMICIN <=1 SENSITIVE Sensitive     IMIPENEM <=0.25 SENSITIVE Sensitive     NITROFURANTOIN <=16 SENSITIVE Sensitive     TRIMETH/SULFA <=20 SENSITIVE Sensitive     AMPICILLIN/SULBACTAM >=32 RESISTANT Resistant     PIP/TAZO <=4 SENSITIVE Sensitive     * >=100,000 COLONIES/mL ESCHERICHIA COLI   Enterococcus faecalis - MIC*    AMPICILLIN <=2 SENSITIVE Sensitive     NITROFURANTOIN <=16 SENSITIVE Sensitive     VANCOMYCIN 1 SENSITIVE Sensitive     * 80,000 COLONIES/mL ENTEROCOCCUS FAECALIS         Radiology Studies: No results found.      Scheduled Meds: . acetaZOLAMIDE  250 mg Oral BID  . apixaban  5 mg Oral BID  . bacitracin   Topical BID  . Chlorhexidine Gluconate Cloth  6 each Topical Daily  . digoxin  0.25 mg Oral Daily  . folic acid  1 mg Oral Daily  . gabapentin  600 mg Oral QPM  . mouth rinse  15 mL Mouth Rinse BID  . metoprolol tartrate  25 mg Oral BID  . multivitamin with minerals  1 tablet Oral Daily  . potassium chloride  40 mEq Oral BID  . sodium chloride flush  10-40 mL Intracatheter Q12H  . testosterone cypionate  200 mg Intramuscular Q14 Days  . thiamine  100 mg Oral Daily   Or  . thiamine  100 mg Intravenous Daily  . vitamin B-12  1,000 mcg Oral Daily   Continuous Infusions: . sodium chloride Stopped (04/04/21 0949)  . sodium chloride    . amiodarone 30 mg/hr (04/10/21 0945)  . furosemide 120 mg (04/10/21 0922)     LOS: 17 days    Time spent: 39 minutes spent on chart review, discussion with nursing staff, consultants, updating family and interview/physical exam; more than 50%  of that time was spent in counseling and/or coordination of care.    Jose Dettman J [place] DO [facility] Available via Epic secure chat 7am-7pm After these hours, please refer to coverage provider listed on [email or web address] 04/10/2021, 1:49 PM

## 2021-04-10 NOTE — NC FL2 (Signed)
[place] LEVEL OF CARE SCREENING TOOL     IDENTIFICATION  Patient Name: Jose Fitzpatrick Birthdate: [birth year] Sex: male Admission Date (Current Location): 04/12/2021  [facility] and [place] Number:  Herbalist and Address:  [facility] [place] [place] [facility], [place] [address] [place], [place] [address]      Provider Number: [id number]  Attending Physician Name and Address:  [place] Donnamarie Poag, DO  Relative Name and Phone Number:       Current Level of Care: Hospital Recommended Level of Care: [place] Prior Approval Number:    Date Approved/Denied:   PASRR Number: [id number]  Discharge Plan: SNF    Current Diagnoses: Patient Active Problem List   Diagnosis Date Noted  . Pressure injury of skin 04/05/2021  . Hypercapnic respiratory failure ([place]) 04/04/2021  . Acute CHF (congestive heart failure) ([place]) 03/25/2021  . Acute respiratory failure with hypoxia ([place]) 03/25/2021  . Elevated TSH 03/25/2021  . BMI 50.0-59.9, adult ([place]) 03/25/2021  . Atrial fibrillation with RVR ([place]) 04/02/2021  . Lymphedema 01/13/2021  . Chronic venous insufficiency 01/13/2021  . Peripheral neuropathy 02/18/2016  . Low back pain 02/02/2016  . Abnormality of gait 02/02/2016  . HTN (hypertension) 06/12/2014  . Overweight 06/12/2014    Orientation RESPIRATION BLADDER Height & Weight     Self,Time,Situation,Place  O2 (3-4L [place]) Continent Weight: (!) 386 lb 7.5 oz (175.3 kg) Height:  6\' 3"  (190.5 cm)  BEHAVIORAL SYMPTOMS/MOOD NEUROLOGICAL BOWEL NUTRITION STATUS      Incontinent (external cath)    AMBULATORY STATUS COMMUNICATION OF NEEDS Skin   Extensive Assist Verbally Normal                       Personal Care Assistance Level of Assistance  Bathing,Dressing Bathing Assistance: Limited assistance   Dressing Assistance: Limited assistance     Functional Limitations Info             SPECIAL CARE FACTORS FREQUENCY  PT (By licensed  PT),OT (By licensed OT)                    Contractures Contractures Info: Not present    Additional Factors Info  Code Status Code Status Info: DNR             Current Medications (04/10/2021):  This is the current hospital active medication list Current Facility-Administered Medications  Medication Dose Route Frequency Provider Last Rate Last Admin  . 0.9 %  sodium chloride infusion   Intravenous PRN Lelon Perla, MD   Stopped at 04/04/21 [phone]  . 0.9 %  sodium chloride infusion   Intra-arterial PRN Lelon Perla, MD      . acetaminophen (TYLENOL) tablet 650 mg  650 mg Oral Q6H PRN Lavina Hamman, MD   650 mg at 04/11/2021 1631  . acetaZOLAMIDE (DIAMOX) tablet 250 mg  250 mg Oral BID [name], MD   250 mg at 04/10/21 0946  . amiodarone (NEXTERONE PREMIX) 360-4.14 MG/200ML-% (1.8 mg/mL) IV infusion  30 mg/hr Intravenous Continuous Lavina Hamman, MD 16.67 mL/hr at 04/10/21 0945 30 mg/hr at 04/10/21 0945  . apixaban (ELIQUIS) tablet 5 mg  5 mg Oral BID Lelon Perla, MD   5 mg at 04/10/21 0945  . bacitracin ointment   Topical BID Lelon Perla, MD   Given at 04/10/21 [phone]  . Chlorhexidine Gluconate Cloth 2 % PADS 6 each  6 each Topical  Daily [place] Eric J, DO   6 each at 04/10/21 [phone]  . digoxin (LANOXIN) tablet 0.25 mg  0.25 mg Oral Daily [name], MD   0.25 mg at 04/10/21 0946  . docusate sodium (COLACE) capsule 100 mg  100 mg Oral BID PRN Lelon Perla, MD      . folic acid (FOLVITE) tablet 1 mg  1 mg Oral Daily Lelon Perla, MD   1 mg at 04/10/21 0943  . furosemide (LASIX) 120 mg in dextrose 5 % 50 mL IVPB  120 mg Intravenous BID [name], MD 62 mL/hr at 04/10/21 0922 120 mg at 04/10/21 0922  . gabapentin (NEURONTIN) capsule 600 mg  600 mg Oral QPM Lelon Perla, MD   600 mg at 04/09/21 1705  . MEDLINE mouth rinse  15 mL Mouth Rinse BID Lelon Perla, MD   15 mL at 04/08/21 1004  . metoprolol  tartrate (LOPRESSOR) tablet 25 mg  25 mg Oral BID [name], MD   25 mg at 04/10/21 0943  . multivitamin with minerals tablet 1 tablet  1 tablet Oral Daily Lelon Perla, MD   1 tablet at 04/10/21 [phone]  . polyethylene glycol (MIRALAX / GLYCOLAX) packet 17 g  17 g Oral Daily PRN Lelon Perla, MD      . potassium chloride SA (KLOR-CON) CR tablet 40 mEq  40 mEq Oral BID [name], MD   40 mEq at 04/10/21 0946  . sodium chloride flush (NS) 0.9 % injection 10-40 mL  10-40 mL Intracatheter Q12H [place] Donnamarie Poag, DO   10 mL at 04/10/21 0947  . sodium chloride flush (NS) 0.9 % injection 10-40 mL  10-40 mL Intracatheter PRN [place] Eric J, DO      . testosterone cypionate (DEPOTESTOSTERONE CYPIONATE) injection 200 mg  200 mg Intramuscular Q14 Days Lelon Perla, MD   200 mg at 03/31/21 1120  . thiamine tablet 100 mg  100 mg Oral Daily Lelon Perla, MD   100 mg at 04/10/21 4825   Or  . thiamine (B-1) injection 100 mg  100 mg Intravenous Daily Lelon Perla, MD      . vitamin B-12 (CYANOCOBALAMIN) tablet 1,000 mcg  1,000 mcg Oral Daily Lelon Perla, MD   1,000 mcg at 04/10/21 0037     Discharge Medications: Please see discharge summary for a list of discharge medications.  Relevant Imaging Results:  Relevant Lab Results:   Additional Information SS# [id number]  [name], [place]

## 2021-04-10 NOTE — Progress Notes (Signed)
Inpatient Rehab Admissions Coordinator:   Pt. rescreened for appropriateness for CIR admit. OT is currently recommending SNF and Pt. Has not attempted OOB. He does not appear able to tolerate CIR level therapies at this time. [facility] team will follow to monitor progress and participation with therapies.   Clemens Catholic, [place], [place] Admissions Coordinator  [phone] ([place]) [phone] (office)

## 2021-04-10 NOTE — Progress Notes (Signed)
Progress Note   Subjective   Net negative 3L yesterday on IV lasix 120 mg BID.  Cr stable at 1.2.  Weight down 4 lbs. Denies any chest pain or dyspnea.   Inpatient Medications    Scheduled Meds: . acetaZOLAMIDE  250 mg Oral BID  . apixaban  5 mg Oral BID  . bacitracin   Topical BID  . Chlorhexidine Gluconate Cloth  6 each Topical Daily  . digoxin  0.125 mg Oral Daily  . folic acid  1 mg Oral Daily  . gabapentin  600 mg Oral QPM  . mouth rinse  15 mL Mouth Rinse BID  . metoprolol tartrate  25 mg Oral BID  . multivitamin with minerals  1 tablet Oral Daily  . potassium chloride  40 mEq Oral BID  . sodium chloride flush  10-40 mL Intracatheter Q12H  . testosterone cypionate  200 mg Intramuscular Q14 Days  . thiamine  100 mg Oral Daily   Or  . thiamine  100 mg Intravenous Daily  . vitamin B-12  1,000 mcg Oral Daily   Continuous Infusions: . sodium chloride Stopped (04/04/21 0949)  . sodium chloride    . amiodarone 30 mg/hr (04/09/21 2123)  . furosemide 120 mg (04/09/21 1707)   PRN Meds: sodium chloride, Place/Maintain arterial line **AND** sodium chloride, acetaminophen, docusate sodium, polyethylene glycol, sodium chloride flush   Vital Signs    Vitals:   04/09/21 2300 04/10/21 0022 04/10/21 0030 04/10/21 0431  BP:  100/72  111/64  Pulse:  (!) 125  72  Resp:  17  (!) 22  Temp:    97.7 F (36.5 C)  TempSrc:    Oral  SpO2: 95%  95% 97%  Weight:    (!) 175.3 kg  Height:        Intake/Output Summary (Last 24 hours) at 04/10/2021 0906 Last data filed at 04/10/2021 0631 Gross per 24 hour  Intake 940.47 ml  Output 4025 ml  Net -3084.53 ml   Filed Weights   04/08/21 0407 04/09/21 0513 04/10/21 0431  Weight: (!) 173.7 kg (!) 176.7 kg (!) 175.3 kg    Telemetry    AFL120-130s- Personally Reviewed  Physical Exam  GEN: in no acute distress HEENT: normal Neck: no JVD appreciated but difficult to assess given habitus Cardiac: regular, tachycardic, no  murmurs Respiratory:  Diminished breath sounds at bases, expiratory wheezing GI: soft, nontender, nondistended MS: venous stasis changes, 1+ edema Skin: warm and dry, no rash Neuro:  Alert and Oriented x 3 Psych: normal affect    Labs    Chemistry Recent Labs  Lab 04/08/21 0459 04/09/21 0410 04/10/21 0434  NA 136 133* 132*  K 4.1 3.8 3.4*  CL 87* 84* 84*  CO2 39* 43* 42*  GLUCOSE 103* 108* 114*  BUN 22 26* 24*  CREATININE 1.13 1.24 1.16  CALCIUM 8.4* 8.0* 8.1*  PROT  --  5.7*  --   ALBUMIN  --  2.2*  --   AST  --  27  --   ALT  --  25  --   ALKPHOS  --  48  --   BILITOT  --  1.3*  --   GFRNONAA >60 >60 >60  ANIONGAP 10 6 6      Hematology Recent Labs  Lab 04/04/21 0500 04/05/21 0355 04/10/2021 0302  WBC 7.6 8.3 7.3  RBC 5.44 4.99 5.22  HGB 17.8* 16.5 16.8  HCT 54.9* 49.4 51.9  MCV 100.9* 99.0 99.4  MCH 32.7 33.1 32.2  MCHC 32.4 33.4 32.4  RDW 13.1 12.8 12.9  PLT 174 136* 174     Patient ID  Rayvon Char Carpenteris a 74 y.o.malewith hypertension, morbid obesity, chronic venous insufficiency, possible alcohol abuse who was admitted on 03/27/2021 with shortness of breath and acute decompensated systolic heart failure. Found to be in atrial flutter as well as afib with RVR.   Assessment & Plan    Atrial fibrillation/ atrial flutter with RVR: Underwent TEE guided [facility] 04/12/2021 but quickly returned to afib. Loaded with amio and underwent repeat DCCV on 5/23.  Converted back to AFL on 5/24 -Continue amio gtt -Continue metprolol 25 mg BID, titration has been limited by soft pressures -Continue eliquis. -Continue digoxin, level undetectable today.  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 No evidence of infection, procalcition <0.1.  Lactate 1.0.  Co-ox on 5/20 67%, not consistent with cardiogenic shock.  Possibly due to aggressive diuresis in setting of taking heart failure meds and AF with RVR contributing, held HF meds/diuresis and underwent DCCV 5/24.  Hypotension has resolved, but BP soft again since back in AFL.  Improved, now normotensive  New onset CHF: EF 25-30%, new diagnosis.  Likely tachycardia mediated.  Suspect volume overload is multifactorial, low albumin (2.2) also contributing -He has been aggressively diuresed, net negative 33L on admission.  Currently on IV lasix 120 mg BID.   CVP 17 today, continue to monitor and will continue lasix 120 mg BID  -Developed hypotension earlier this admission on entresto 24-26 mg BID, aldactone 25 mg daily, metoprolol 25 mg qid.  Holding aldactone and entresto. -Developing metabolic alkalosis, added diamox 250 mg BID    [name], MD

## 2021-04-10 NOTE — Progress Notes (Signed)
Occupational Therapy Treatment Patient Details Name: Jose Fitzpatrick MRN: [id number] DOB: 01-21-1946 Today's Date: 04/10/2021    History of present illness Pt is a 75 y.o. male admitted to [facility] on 04/05/2021 with BLE swelling, DOE, weaight gain. Workup for CHF exacerbation, aflutter/afib with RVR. S/p TEE guided DCCV 5/16 but quickly returned to afib. Pt with hypotension and transfer to [facility] on 5/21. S/p repeat DCCV 5/23. Converted back to aflutter 5/24. 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   OT comments  Pt making incremental progress with OT goals. This session pt transferred OOB to recliner with maximove, he tolerated transfer well. For maximove pad placement, pt required max A +2 for bedmobility, as well as pt was dependent for pericare/LB bathing. Pt continues to be flat affect during sessions, no speaking much, however he seemed glad to be OOB in chair, asking if his daughters were here to see him. Acute OT will continue to follow to address OT/pt goals.    Follow Up Recommendations  SNF    Equipment Recommendations  None recommended by OT    Recommendations for Other Services      Precautions / Restrictions Precautions Precautions: Fall;Other (comment) Precaution Comments: Watch HR (afib/aflutter in 120s at rest); bowel/bladder incontinence Restrictions Weight Bearing Restrictions: No       Mobility Bed Mobility Overal bed mobility: Needs Assistance Bed Mobility: Rolling Rolling: Max assist;+2 for physical assistance         General bed mobility comments: MaxA+2 to roll R/L in bed for maximove pad placement and pericare, heavy reliance to pull on bed rail; maxA+2 for additional rolling R/L in recliner for pericare and linen change; totalA for scooting up in recliner    Transfers Overall transfer level: Needs assistance               General transfer comment: Use of maximove lift to transfer from bed to recliner, pt tolerated well, increased fatigue  post-treatment; bowel incontinence while in lift, dependent for pericare    Balance Overall balance assessment: Needs assistance Sitting-balance support: No upper extremity supported;Feet supported;Bilateral upper extremity supported Sitting balance-Leahy Scale: Fair                                     ADL either performed or assessed with clinical judgement   ADL Overall ADL's : Needs assistance/impaired             Lower Body Bathing: Maximal assistance;+2 for physical assistance;+2 for safety/equipment;Bed level Lower Body Bathing Details (indicate cue type and reason): Pt required assistance rolling and pt was unable to complete pericare                     Functional mobility during ADLs: Maximal assistance;+2 for physical assistance;+2 for safety/equipment (Maximove) General ADL Comments: Pt transferrred to recliner this session with maximove, required max assist +2 to roll for pad placement.     Vision       Perception     Praxis      Cognition Arousal/Alertness: Awake/alert Behavior During Therapy: Flat affect Overall Cognitive Status: Within Functional Limits for tasks assessed                                 General Comments: WFL for tasks; flat affect with minimal conversation during session, but seemed pleased he was able  to transfer to recliner this session        Exercises     Shoulder Instructions       General Comments HR maintained above 120's to a max seen HR of 140.    Pertinent Vitals/ Pain       Pain Assessment: Faces Faces Pain Scale: Hurts a little bit Pain Location: RLE neuropathy Pain Descriptors / Indicators: Heaviness;Discomfort Pain Intervention(s): Monitored during session  Home Living                                          Prior Functioning/Environment              Frequency  Min 2X/week        Progress Toward Goals  OT Goals(current goals can now be  found in the care plan section)  Progress towards OT goals: Progressing toward goals  Acute Rehab OT Goals Patient Stated Goal: "I want to go home" OT Goal Formulation: With patient Time For Goal Achievement: 04/15/21 Potential to Achieve Goals: Good ADL Goals Pt Will Perform Lower Body Dressing: with min assist;with adaptive equipment;sit to/from stand Pt Will Transfer to Toilet: with min assist;bedside commode;stand pivot transfer Pt Will Perform Toileting - Clothing Manipulation and hygiene: with min assist;sit to/from stand Pt/caregiver will Perform Home Exercise Program: Both right and left upper extremity;Increased strength Additional ADL Goal #1: Patient will stand at sink to perform grooming task as evidence of improving activity tolerance  Plan Discharge plan remains appropriate;Frequency remains appropriate    Co-evaluation    PT/OT/SLP Co-Evaluation/Treatment: Yes Reason for Co-Treatment: To address functional/ADL transfers;For patient/therapist safety PT goals addressed during session: Mobility/safety with mobility;Balance OT goals addressed during session: ADL's and self-care      AM-PAC OT "6 Clicks" Daily Activity     Outcome Measure   Help from another person eating meals?: None Help from another person taking care of personal grooming?: A Little Help from another person toileting, which includes using toliet, bedpan, or urinal?: Total Help from another person bathing (including washing, rinsing, drying)?: A Lot Help from another person to put on and taking off regular upper body clothing?: A Little Help from another person to put on and taking off regular lower body clothing?: Total 6 Click Score: 14    End of Session Equipment Utilized During Treatment: Oxygen  OT Visit Diagnosis: History of falling (Z91.81);Muscle weakness (generalized) (M62.81)   Activity Tolerance Patient tolerated treatment well   Patient Left in chair;with call bell/phone within  reach   Nurse Communication Mobility status;Need for lift equipment        Time: 1457-1539 OT Time Calculation (min): 42 min  Charges: OT General Charges $OT Visit: 1 Visit OT Treatments $Self Care/Home Management : 8-22 mins  Avleen Bordwell H., OTR/L Acute Rehabilitation  [place] Albea Elane Yolanda Bonine 04/10/2021, 4:52 PM

## 2021-04-10 NOTE — Progress Notes (Signed)
Heart Failure Stewardship Pharmacist Progress Note   PCP: Tamsen Roers, MD PCP-Cardiologist: Kirk Ruths, MD    HPI:  75 YO male with PMH significant for HTN, morbid obesity, possible alcohol abuse. Admitted 04/08/2021 with shortness of breath and acute decompensated systolic HF; also in atrial flutter and afib with RVR. Underwent TEE guided [facility], however afib returned. Repeat was successful and converted back to AFL 5/24. Newly diagnosed CHF with EF of 25-30%.   Current HF Medications: Diamox 250 mg BID Furosemide 120 mg IV BID Metoprolol tartrate 25 mg BID Digoxin 0.25 mg daily   Prior to admission HF Medications: Metoprolol succinate 50 mg daily  Lisinopril-HCTZ 20-25 mg    Pertinent Lab Values: . Serum creatinine 1.16, BUN 24, Potassium 3.4, Sodium 132, BNP 77.5, Magnesium 2.1, Digoxin 0.3 (5/22), Digoxin <0.2 (5/27)  Vital Signs: . Weight: 386 lbs (admission weight: 400 lbs) . Blood pressure: 110-120s/70s . Heart rate: 120s    Medication Assistance / Insurance Benefits Check: Does the patient have prescription insurance?  Yes Type of insurance plan: [employer]  Does the patient qualify for medication assistance through manufacturers or grants?   Pending   Outpatient Pharmacy:  Prior to admission outpatient [facility] Is the patient willing to use [place] pharmacy at discharge? Pending Is the patient willing to transition their outpatient pharmacy to utilize a [facility] outpatient pharmacy?   Pending    Assessment: 1. Acute on chronic systolic CHF (EF 96-04%). NYHA class II/III symptoms. - Agree with starting diamox 250 mg BID. CO2 up to 42. - Continue furosemide 120 mg IV BID. Weight down additional 3 lbs today. 3.1L out yesterday. - Continue metoprolol tartrate 25 mg BID - consider transitioning to XL formulation prior to discharge  - Holding Entresto and spironolactone for now - developed hypotension earlier in admission after administration of these  agents; consider restarting prior to discharge as patient can tolerate; Please note that he was taking lisinopril-hctz PTA - Holding off on SGLT2i for now - UA positive for E coli, may exacerbate. Consider starting in future if found appropriate. - Agree with increasing digoxin from 0.125 mg to 0.25 daily - level <0.2 today   Plan: 1) Medication changes recommended at this time: - Continue IV diuresis   2) Patient assistance: - Jardiance Copay: $45/month - Farxiga Copay: $95/month - Unable to complete copay test for Entresto due to recent fill of lisinopril  - Dispo: possible CIR  3)  Education  - To be completed prior to discharge  Kerby Nora, PharmD, BCPS Heart Failure Stewardship Pharmacist Phone [phone]

## 2021-04-10 NOTE — Progress Notes (Signed)
Physical Therapy Treatment Patient Details Name: Jose Fitzpatrick MRN: [id number] DOB: 1945-12-28 Today's Date: 04/10/2021    History of Present Illness Pt is a 75 y.o. male admitted to [facility] on 03/15/2021 with BLE swelling, DOE, weaight gain. Workup for CHF exacerbation, aflutter/afib with RVR. S/p TEE guided DCCV 5/16 but quickly returned to afib. Pt with hypotension and transfer to [facility] on 5/21. S/p repeat DCCV 5/23. Converted back to aflutter 5/24. 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   PT Comments    Pt progressing with mobility. Performed first transfer to recliner this session; pt tolerated well with use of maximove lift. Pt requires maxA+2 for bed mobility, including rolling; dependent for pericare. Pt with increased flat affect this session, but following commands appropriately and seems pleased to be sitting in recliner. Based on current functional status, do not expect pt to tolerate intensive CIR-level therapies; therefore, recommend SNF-level therapies to maximize functional mobility and independence. Will continue to follow acutely to address established goals.  HR 120s-140s   Follow Up Recommendations  SNF;Supervision for mobility/OOB (pending progress)     Equipment Recommendations   (TBD)    Recommendations for Other Services       Precautions / Restrictions Precautions Precautions: Fall;Other (comment) Precaution Comments: Watch HR (afib/aflutter in 120s at rest); bowel/bladder incontinence Restrictions Weight Bearing Restrictions: No    Mobility  Bed Mobility Overal bed mobility: Needs Assistance Bed Mobility: Rolling Rolling: Max assist;+2 for physical assistance         General bed mobility comments: MaxA+2 to roll R/L in bed for maximove pad placement and pericare, heavy reliance to pull on bed rail; maxA+2 for additional rolling R/L in recliner for pericare and linen change; totalA for scooting up in recliner    Transfers Overall transfer  level: Needs assistance               General transfer comment: Use of maximove lift to transfer from bed to recliner, pt tolerated well, increased fatigue post-treatment; bowel incontinence while in lift, dependent for pericare  Ambulation/Gait                 Stairs             Wheelchair Mobility    Modified Rankin (Stroke Patients Only)       Balance Overall balance assessment: Needs assistance                                          Cognition Arousal/Alertness: Awake/alert Behavior During Therapy: Flat affect Overall Cognitive Status: Within Functional Limits for tasks assessed                                 General Comments: WFL for tasks; flat affect with minimal conversation during session, but seemed pleased he was able to transfer to recliner this session      Exercises      General Comments General comments (skin integrity, edema, etc.): HR 120s-140s during session      Pertinent Vitals/Pain Pain Assessment: Faces Faces Pain Scale: Hurts a little bit Pain Location: RLE neuropathy Pain Descriptors / Indicators: Heaviness;Discomfort Pain Intervention(s): Monitored during session    Home Living                      Prior Function  PT Goals (current goals can now be found in the care plan section) Acute Rehab PT Goals Patient Stated Goal: "I want to go home" PT Goal Formulation: With patient Time For Goal Achievement: 04/24/21 Potential to Achieve Goals: Fair Progress towards PT goals: Progressing toward goals    Frequency    Min 3X/week      PT Plan Current plan remains appropriate    Co-evaluation PT/OT/SLP Co-Evaluation/Treatment: Yes Reason for Co-Treatment: To address functional/ADL transfers;For patient/therapist safety PT goals addressed during session: Mobility/safety with mobility;Balance        AM-PAC PT "6 Clicks" Mobility   Outcome Measure  Help  needed turning from your back to your side while in a flat bed without using bedrails?: A Lot Help needed moving from lying on your back to sitting on the side of a flat bed without using bedrails?: Total Help needed moving to and from a bed to a chair (including a wheelchair)?: Total Help needed standing up from a chair using your arms (e.g., wheelchair or bedside chair)?: Total Help needed to walk in hospital room?: Total Help needed climbing 3-5 steps with a railing? : Total 6 Click Score: 7    End of Session Equipment Utilized During Treatment: Oxygen Activity Tolerance: Patient tolerated treatment well Patient left: in chair;with call bell/phone within reach Nurse Communication: Mobility status;Need for lift equipment PT Visit Diagnosis: Difficulty in walking, not elsewhere classified (R26.2);Muscle weakness (generalized) (M62.81);Other abnormalities of gait and mobility (R26.89)     Time: 7948-0165 PT Time Calculation (min) (ACUTE ONLY): 42 min  Charges:  $Therapeutic Activity: 23-37 mins                     Mabeline Caras, PT, DPT Acute Rehabilitation Services  Pager [phone] Office [place] 04/10/2021, 4:33 PM

## 2021-04-10 NOTE — TOC Progression Note (Signed)
Transition of Care [facility]) - Progression Note    Patient Details  Name: EDDRICK DILONE MRN: [id number] Date of Birth: 1946/05/11  Transition of Care [facility]) CM/SW Contact  Wandra Feinstein [place], [place] Phone Number: 04/10/2021, 3:10 PM  Clinical Narrative:  Spoke with pt's granddtr Loma Sousa re CIR vs SNF. CIR continuing to follow and assess pt's ability to participate at IRF level of care. Pt's granddtr reports family prefers CIR but if pt not accepted or insurance denies, pt will need to dc to a SNF. Sent for SNF offers as back up plan but will hold off on auth request until CIR makes determination. Will follow and assist as indicated.   Wandra Feinstein, MSW, LCSW [phone] (coverage)        Expected Discharge Plan: Home/Self Care Barriers to Discharge: Continued Medical Work up  Expected Discharge Plan and Services Expected Discharge Plan: Home/Self Care   Discharge Planning Services: CM Consult   Living arrangements for the past 2 months: Single Family Home                                       Social Determinants of Health (SDOH) Interventions    Readmission Risk Interventions No flowsheet data found.

## 2021-04-11 DIAGNOSIS — I4891 Unspecified atrial fibrillation: Secondary | ICD-10-CM | POA: Diagnosis not present

## 2021-04-11 DIAGNOSIS — I5021 Acute systolic (congestive) heart failure: Secondary | ICD-10-CM | POA: Diagnosis not present

## 2021-04-11 LAB — BASIC METABOLIC PANEL
Anion gap: 8 (ref 5–15)
BUN: 25 mg/dL — ABNORMAL HIGH (ref 8–23)
CO2: 39 mmol/L — ABNORMAL HIGH (ref 22–32)
Calcium: 8 mg/dL — ABNORMAL LOW (ref 8.9–10.3)
Chloride: 88 mmol/L — ABNORMAL LOW (ref 98–111)
Creatinine, Ser: 1.1 mg/dL (ref 0.61–1.24)
GFR, Estimated: >60 mL/min (ref >60)
Glucose, Bld: 101 mg/dL — ABNORMAL HIGH (ref 70–99)
Potassium: 3.5 mmol/L (ref 3.5–5.1)
Sodium: 135 mmol/L (ref 135–145)

## 2021-04-11 LAB — MAGNESIUM: Magnesium: 2.1 mg/dL (ref 1.7–2.4)

## 2021-04-11 MED ORDER — SPIRONOLACTONE 12.5 MG HALF TABLET
12.5000 mg | ORAL_TABLET | Freq: Every day | ORAL | Status: DC
  Administered 2021-04-11 – 2021-04-12 (×2): 12.5 mg via ORAL
  Filled 2021-04-11 (×2): qty 1

## 2021-04-11 NOTE — Progress Notes (Signed)
Progress Note  Patient Name: Jose Fitzpatrick Date of Encounter: 04/11/2021  [facility] Cardiologist: Kirk Ruths, MD   Subjective   Pt denies CP or dyspnea  Inpatient Medications    Scheduled Meds: . acetaZOLAMIDE  250 mg Oral BID  . apixaban  5 mg Oral BID  . bacitracin   Topical BID  . Chlorhexidine Gluconate Cloth  6 each Topical Daily  . digoxin  0.25 mg Oral Daily  . folic acid  1 mg Oral Daily  . gabapentin  600 mg Oral QPM  . mouth rinse  15 mL Mouth Rinse BID  . metoprolol tartrate  25 mg Oral BID  . multivitamin with minerals  1 tablet Oral Daily  . potassium chloride  40 mEq Oral BID  . sodium chloride flush  10-40 mL Intracatheter Q12H  . testosterone cypionate  200 mg Intramuscular Q14 Days  . thiamine  100 mg Oral Daily   Or  . thiamine  100 mg Intravenous Daily  . vitamin B-12  1,000 mcg Oral Daily   Continuous Infusions: . sodium chloride 500 mL (04/10/21 2133)  . sodium chloride    . amiodarone 30 mg/hr (04/10/21 2207)  . furosemide 120 mg (04/10/21 1738)   PRN Meds: sodium chloride, Place/Maintain arterial line **AND** sodium chloride, acetaminophen, docusate sodium, polyethylene glycol, sodium chloride flush   Vital Signs    Vitals:   04/10/21 0943 04/10/21 2005 04/11/21 0001 04/11/21 0432  BP: 110/74 108/68 101/69 107/64  Pulse: (!) 138 (!) 116 (!) 113 (!) 112  Resp:  19 17 17   Temp:  97.8 F (36.6 C) 97.9 F (36.6 C)   TempSrc:  Oral Oral   SpO2:   95% 99%  Weight:    (!) 172.2 kg  Height:        Intake/Output Summary (Last 24 hours) at 04/11/2021 0602 Last data filed at 04/11/2021 0435 Gross per 24 hour  Intake --  Output 2250 ml  Net -2250 ml   Last 3 Weights 04/11/2021 04/10/2021 04/09/2021  Weight (lbs) 379 lb 10.1 oz 386 lb 7.5 oz 389 lb 8.9 oz  Weight (kg) 172.2 kg 175.3 kg 176.7 kg      Telemetry    Atrial fibrillation; rate mildly elevated- Personally Reviewed   Physical Exam   GEN: No acute distress.   Obese Neck: supple Cardiac: irregular and tachycardic Respiratory: Clear to auscultation bilaterally. GI: Soft, nontender, non-distended  MS: chronic skin changes; 3+ edema Neuro:  Nonfocal  Psych: Normal affect   Labs    High Sensitivity Troponin:   Recent Labs  Lab 03/29/2021 2023 04/08/2021 2147  TROPONINIHS 12 12      Chemistry Recent Labs  Lab 04/08/21 0459 04/09/21 0410 04/10/21 0434  NA 136 133* 132*  K 4.1 3.8 3.4*  CL 87* 84* 84*  CO2 39* 43* 42*  GLUCOSE 103* 108* 114*  BUN 22 26* 24*  CREATININE 1.13 1.24 1.16  CALCIUM 8.4* 8.0* 8.1*  PROT  --  5.7*  --   ALBUMIN  --  2.2*  --   AST  --  27  --   ALT  --  25  --   ALKPHOS  --  48  --   BILITOT  --  1.3*  --   GFRNONAA >60 >60 >60  ANIONGAP 10 6 6      Hematology Recent Labs  Lab 04/05/21 0355 04/03/2021 0302  WBC 8.3 7.3  RBC 4.99 5.22  HGB 16.5 16.8  HCT 49.4 51.9  MCV 99.0 99.4  MCH 33.1 32.2  MCHC 33.4 32.4  RDW 12.8 12.9  PLT 136* 174    BNP Recent Labs  Lab 04/03/2021 0302  BNP 77.5     Patient Profile     Standley Bargo Carpenteris a 75 y.o.malewith hypertension, morbid obesity, chronic venous insufficiency, possible alcohol abuse who was admitted on 03/18/2021 with acute decompensated systolic heart failure. Found to be in atrial flutter as well as afib with RVR.Patient had TEE guided cardioversion on May 16 (ejection fraction 25 to 30%, moderate RV dysfunction, moderate left atrial enlargement, moderate right atrial enlargement, mild mitral regurgitation).  He did not hold sinus rhythm.  Had repeat cardioversion May 23 but atrial fibrillation again recurred.  Assessment & Plan    1 acute systolic congestive heart failure-[id number] since admission. Wt 172.2 kg.  Patient remains volume overloaded.  We will continue Lasix at present dose.  Add spironolactone 12.5 mg daily.  Follow renal function closely.  Note Diamox added yesterday for possible contraction alkalosis.  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  He became hypotensive on Entresto previously and we are therefore holding.  Also not clear to me that he will tolerate ARB at this point but we may try losartan in the next 24 to 48 hours.  We will change metoprolol to Toprol.  Will need echocardiogram 3 months after heart rate controlled to see if LV function has improved.  If not would need ischemia evaluation.  3 atrial fibrillation/flutter-patient remains in atrial fibrillation and heart rate elevated.  Sinus was reestablished previously on 2 separate occasions with cardioversion but atrial fibrillation recurred.  We will continue amiodarone.  Hopefully as more amiodarone is in his system and CHF improves he will hold sinus rhythm.  We will likely plan reattempted cardioversion late next week.  Continue metoprolol and digoxin.  Will need to be careful with digoxin dosing given amiodarone interaction.  4 hypertension-blood pressure has improved.  Now borderline.  5 obesity-he will need an outpatient sleep study and also needs weight loss.  For questions or updates, please contact [place] Please consult [email or web address] for contact info under        Signed, Kirk Ruths, MD  04/11/2021, 6:02 AM

## 2021-04-11 NOTE — Progress Notes (Signed)
PROGRESS NOTE    [name]  QPR:[id number] DOB: December 08, 1945 DOA: 04/02/2021 PCP: Tamsen Roers, MD    Brief Narrative:  Jose Fitzpatrick is a 75 year old male with past medical history significant for essential hypertension, morbid obesity, peripheral neuropathy who presented to the ED on 5/10 with shortness of breath and worsening lower extremity edema.  Patient reports onset of symptoms over the past 2 days with associated difficulty lying flat and sleeping on incline.  He also describes weeping of fluids on his lower extremities.  Denies chest pain, no palpitations or chest pain.  No previous cardiac history.  Denies heavy alcohol use or illicit drug use.  In the ED, patient was noted to be in atrial fibrillation with RVR with rates of 280.  BNP 88, high sensitive troponin 12.  Chest x-ray with mild cardiomegaly and vascular congestion/pulmonary edema.  Patient was given 20 mg IV diltiazem bolus and started on diltiazem infusion.  Patient was also given 60 mg of IV Lasix with over 1 L output.  Cardiology was consulted.  TRH consulted for further evaluation and management of new onset atrial fibrillation with RVR.   Assessment & Plan:   Principal Problem:   Atrial fibrillation with RVR (HCC) Active Problems:   HTN (hypertension)   Acute CHF (congestive heart failure) (HCC)   Acute respiratory failure with hypoxia (HCC)   Elevated TSH   BMI 50.0-59.9, adult (HCC)   Hypercapnic respiratory failure (HCC)   Pressure injury of skin   Atrial fibrillation with RVR, new diagnosis Patient presenting to ED with progressive shortness of breath and lower extremity edema.  On arrival, patient was noted to be in A. fib with RVR.  TSH 4.870 on 5/11.  Underwent DCCV 5/16 and 5/23, unfortunately reconverted back to A. fib on 5/24. --Amiodarone drip --Metoprolol tartrate 25 mg p.o. twice daily. --Digoxin increased to 0.25 mg p.o. daily --Eliquis 5 mg p.o. twice daily --EP recommends repeat  DCCV once patient euvolemic  New onset acute systolic congestive heart failure Acute hypoxic respiratory failure, POA Patient presenting to ED with progressive shortness of breath and lower extremity edema.  Hypoxic on presentation.  Chest x-ray with interstitial pulmonary edema and small bilateral pleural effusions.  TTE with LVEF 25-30%.  Suspect possibly related to arrhythmia. --net negative 3L past 24 hrs and net negative 32.7L since admission --wt 181.4>>184.7>>166.7>>176.7>175.3kg --Furosemide 120 mg IV every 12 hours --Diamox 250mg  PO BID --Holding Aldactone and Entresto due to borderline hypotension --UNNA boots --Strict I's and O's and daily weights --Continue supplemental oxygen, maintain SPO2 greater than 92%, currently on 3.5 L nasal cannula --BMP daily  Acute renal failure --Cr 1.02>>>1.44>1.22>>1.04>1.15>1.13>1.24>1.16>1.10 --BMP daily  Hx Essential hypertension BP 99/61 this morning.  Likely complicated by persistent A. fib with RVR.  Required transfer to ICU on pressure support on 5/20; possibly due to aggressive diuresis in the setting of taking heart failure medications and A. fib with RVR. --Metoprolol tartrate 25 mg p.o. twice daily --Holding Aldactone and Entresto --Hope BP will improve with better rate control --Continue monitor BP closely  Subclinical hypothyroidism TSH slightly elevated 4.870 with normal free T3/4.  EtOH use disorder Counseled on need for complete cessation. --Thiamine, B12, multivitamin, folic acid  Morbid obesity Body mass index is 47.45 kg/m.  Discussed with patient needs for aggressive lifestyle changes/weight loss as this complicates all facets of care.  Outpatient follow-up with PCP.  May benefit from bariatric evaluation outpatient.  Pressure injury bilateral ears, POA Pressure Injury 04/05/21 Ear  Left Stage 2 -  Partial thickness loss of dermis presenting as a shallow open injury with a red, pink wound bed without slough. from  oxygen tubing (Active)  04/05/21 1312  Location: Ear  Location Orientation: Left  Staging: Stage 2 -  Partial thickness loss of dermis presenting as a shallow open injury with a red, pink wound bed without slough.  Wound Description (Comments): from oxygen tubing  Present on Admission: Yes     Pressure Injury 04/05/21 Ear Right Stage 2 -  Partial thickness loss of dermis presenting as a shallow open injury with a red, pink wound bed without slough. from oxygen tubing (Active)  04/05/21 1317  Location: Ear  Location Orientation: Right  Staging: Stage 2 -  Partial thickness loss of dermis presenting as a shallow open injury with a red, pink wound bed without slough.  Wound Description (Comments): from oxygen tubing  Present on Admission: Yes  --Continue local wound care  Weakness/deconditioning/debility: --Continue PT/OT efforts while inpatient   DVT prophylaxis:  apixaban (ELIQUIS) tablet 5 mg    Code Status: DNR Family Communication: No family present at bedside this morning  Disposition Plan:  Level of care: Progressive Status is: Inpatient  Remains inpatient appropriate because:Ongoing diagnostic testing needed not appropriate for outpatient work up, Unsafe d/c plan, IV treatments appropriate due to intensity of illness or inability to take PO and Inpatient level of care appropriate due to severity of illness   Dispo: The patient is from: Home              Anticipated d/c is to: SNF              Patient currently is not medically stable to d/c.   Difficult to place patient No  Consultants:   Cardiology  Procedures:   TTE 5/13: LVEF 35%  TEE/DCCV 5/16  TEE/DCCV 5/23  Antimicrobials:   none   Subjective: Patient seen examined at bedside, resting comfortably.  Good diuresis overnight.  Son present at bedside.  Dyspnea continues to slowly improve daily.  No other questions or concerns at this time.  Denies headache, no fever/chills/night sweats, no  nausea/vomiting/diarrhea, no active chest pain, no palpitations, no current shortness of breath, no abdominal pain.  No other acute events overnight per nursing staff.  Objective: Vitals:   04/10/21 2005 04/11/21 0001 04/11/21 0432 04/11/21 0925  BP: 108/68 101/69 107/64 108/73  Pulse: (!) 116 (!) 113 (!) 112 (!) 129  Resp: 19 17 17    Temp: 97.8 F (36.6 C) 97.9 F (36.6 C)    TempSrc: Oral Oral    SpO2:  95% 99%   Weight:   (!) 172.2 kg   Height:        Intake/Output Summary (Last 24 hours) at 04/11/2021 1147 Last data filed at 04/11/2021 1045 Gross per 24 hour  Intake --  Output 1950 ml  Net -1950 ml   Filed Weights   04/09/21 0513 04/10/21 0431 04/11/21 0432  Weight: (!) 176.7 kg (!) 175.3 kg (!) 172.2 kg    Examination:  General exam: Appears calm and comfortable, obese, chronically ill in appearance Respiratory system: Decreased breath sounds bilateral bases without crackles/wheezing, normal Respaire effort, on 3.5 L nasal cannula with SPO2 97% Cardiovascular system: S1 & S2 heard, irregularly irregular rhythm, tachycardic. No JVD, murmurs, rubs, gallops or clicks.  Significant pedal edema up to thighs with Unna boots in place Gastrointestinal system: Abdomen is nondistended, soft and nontender. No organomegaly or masses felt. Normal  bowel sounds heard. Central nervous system: Alert and oriented. No focal neurological deficits. Extremities: Symmetric 5 x 5 power.  Unna boots in place. Skin: No rashes, lesions or ulcers Psychiatry: Judgement and insight appear poor. Mood & affect appropriate.     Data Reviewed: I have personally reviewed following labs and imaging studies  CBC: Recent Labs  Lab 04/05/21 0355 04/12/2021 0302  WBC 8.3 7.3  HGB 16.5 16.8  HCT 49.4 51.9  MCV 99.0 99.4  PLT 136* 676   Basic Metabolic Panel: Recent Labs  Lab 04/05/21 0355 04/05/21 0355 04/05/21 1509 03/23/2021 0302 04/07/21 0213 04/08/21 0459 04/09/21 0410 04/10/21 0434  04/11/21 0542  NA  --    < > 132* 132* 136 136 133* 132* 135  K  --    < > 4.3 4.1 4.5 4.1 3.8 3.4* 3.5  CL  --    < > 83* 86* 86* 87* 84* 84* 88*  CO2  --    < > 40* 40* 42* 39* 43* 42* 39*  GLUCOSE  --    < > 208* 97 105* 103* 108* 114* 101*  BUN  --    < > 29* 24* 20 22 26* 24* 25*  CREATININE  --    < > 1.12 1.11 1.15 1.13 1.24 1.16 1.10  CALCIUM  --    < > 8.0* 8.4* 8.6* 8.4* 8.0* 8.1* 8.0*  MG 1.9  --  2.0 2.1  --  1.9 2.1 2.1 2.1  PHOS 2.8  --  3.1 3.0  --   --   --   --   --    < > = values in this interval not displayed.   GFR: Estimated Creatinine Clearance: 98.2 mL/min (by C-G formula based on SCr of 1.1 mg/dL). Liver Function Tests: Recent Labs  Lab 04/09/21 0410  AST 27  ALT 25  ALKPHOS 48  BILITOT 1.3*  PROT 5.7*  ALBUMIN 2.2*   No results for input(s): LIPASE, AMYLASE in the last 168 hours. No results for input(s): AMMONIA in the last 168 hours. Coagulation Profile: Recent Labs  Lab 03/22/2021 0734  INR 1.4*   Cardiac Enzymes: No results for input(s): CKTOTAL, CKMB, CKMBINDEX, TROPONINI in the last 168 hours. BNP (last 3 results) No results for input(s): PROBNP in the last 8760 hours. HbA1C: No results for input(s): HGBA1C in the last 72 hours. CBG: Recent Labs  Lab 04/10/21 2129  GLUCAP 121*   Lipid Profile: No results for input(s): CHOL, HDL, LDLCALC, TRIG, CHOLHDL, LDLDIRECT in the last 72 hours. Thyroid Function Tests: No results for input(s): TSH, T4TOTAL, FREET4, T3FREE, THYROIDAB in the last 72 hours. Anemia Panel: No results for input(s): VITAMINB12, FOLATE, FERRITIN, TIBC, IRON, RETICCTPCT in the last 72 hours. Sepsis Labs: Recent Labs  Lab 04/04/21 1238 04/04/21 1441 04/05/21 0355 04/09/2021 0302  PROCALCITON <0.10  --  <0.10 <0.10  LATICACIDVEN  --  1.0  --   --     Recent Results (from the past 240 hour(s))  Culture, blood (routine x 2)     Status: None   Collection Time: 04/04/21  1:27 PM   Specimen: BLOOD  Result Value Ref  Range Status   Specimen Description   Final    BLOOD LEFT ANTECUBITAL Performed at [facility] [address] [place], [place] [address]    Special Requests   Final    BOTTLES DRAWN AEROBIC ONLY Blood Culture results may not be optimal due to an excessive  volume of blood received in culture bottles Performed at [facility] [address], [place], [place] [address]    Culture   Final    NO GROWTH 5 DAYS Performed at [facility], [place] [address], [place], [place] [address]    Report Status 04/09/2021 FINAL  Final  Culture, blood (routine x 2)     Status: None   Collection Time: 04/04/21  1:27 PM   Specimen: BLOOD  Result Value Ref Range Status   Specimen Description   Final    BLOOD LEFT ANTECUBITAL Performed at [facility] [address] [place], [place] [address]    Special Requests   Final    BOTTLES DRAWN AEROBIC ONLY Blood Culture results may not be optimal due to an excessive volume of blood received in culture bottles Performed at [facility] [address], [place], [place] [address]    Culture   Final    NO GROWTH 5 DAYS Performed at [facility], [place] [address], [place], [place] [address]    Report Status 04/09/2021 FINAL  Final  Culture, Urine     Status: Abnormal   Collection Time: 04/04/21  6:43 PM   Specimen: Urine, Clean Catch  Result Value Ref Range Status   Specimen Description   Final    URINE, CLEAN CATCH Performed at [facility], [place] [address] [place], [place] [address]    Special Requests   Final    NONE Performed at [facility], [place] [address] [place],  [address]    Culture (A)  Final    >=100,000 COLONIES/mL ESCHERICHIA COLI 80,000 COLONIES/mL ENTEROCOCCUS FAECALIS Confirmed Extended Spectrum Beta-Lactamase Producer (ESBL).  In bloodstream infections from ESBL organisms, carbapenems are preferred over  piperacillin/tazobactam. They are shown to have a lower risk of mortality.    Report Status 04/07/2021 FINAL  Final   Organism ID, Bacteria ESCHERICHIA COLI (A)  Final   Organism ID, Bacteria ENTEROCOCCUS FAECALIS (A)  Final      Susceptibility   Escherichia coli - MIC*    AMPICILLIN >=32 RESISTANT Resistant     CEFAZOLIN >=64 RESISTANT Resistant     CEFEPIME 8 INTERMEDIATE Intermediate     CEFTRIAXONE >=64 RESISTANT Resistant     CIPROFLOXACIN <=0.25 SENSITIVE Sensitive     GENTAMICIN <=1 SENSITIVE Sensitive     IMIPENEM <=0.25 SENSITIVE Sensitive     NITROFURANTOIN <=16 SENSITIVE Sensitive     TRIMETH/SULFA <=20 SENSITIVE Sensitive     AMPICILLIN/SULBACTAM >=32 RESISTANT Resistant     PIP/TAZO <=4 SENSITIVE Sensitive     * >=100,000 COLONIES/mL ESCHERICHIA COLI   Enterococcus faecalis - MIC*    AMPICILLIN <=2 SENSITIVE Sensitive     NITROFURANTOIN <=16 SENSITIVE Sensitive     VANCOMYCIN 1 SENSITIVE Sensitive     * 80,000 COLONIES/mL ENTEROCOCCUS FAECALIS         Radiology Studies: No results found.      Scheduled Meds: . acetaZOLAMIDE  250 mg Oral BID  . apixaban  5 mg Oral BID  . bacitracin   Topical BID  . Chlorhexidine Gluconate Cloth  6 each Topical Daily  . digoxin  0.25 mg Oral Daily  . folic acid  1 mg Oral Daily  . gabapentin  600 mg Oral QPM  . mouth rinse  15 mL Mouth Rinse BID  . metoprolol tartrate  25 mg Oral BID  . multivitamin with minerals  1 tablet  Oral Daily  . potassium chloride  40 mEq Oral BID  . sodium chloride flush  10-40 mL Intracatheter Q12H  . spironolactone  12.5 mg Oral Daily  . testosterone cypionate  200 mg Intramuscular Q14 Days  . thiamine  100 mg Oral Daily   Or  . thiamine  100 mg Intravenous Daily  . vitamin B-12  1,000 mcg Oral Daily   Continuous Infusions: . sodium chloride 500 mL (04/10/21 2133)  . sodium chloride    . amiodarone 30 mg/hr (04/11/21 1121)  . furosemide 120 mg (04/11/21 0935)     LOS: 18 days     Time spent: 37 minutes spent on chart review, discussion with nursing staff, consultants, updating family and interview/physical exam; more than 50% of that time was spent in counseling and/or coordination of care.    Tyreonna Czaplicki J [place] DO [facility] Available via Epic secure chat 7am-7pm After these hours, please refer to coverage provider listed on [email or web address] 04/11/2021, 11:47 AM

## 2021-04-12 DIAGNOSIS — I4891 Unspecified atrial fibrillation: Secondary | ICD-10-CM | POA: Diagnosis not present

## 2021-04-12 LAB — BASIC METABOLIC PANEL
Anion gap: 8 (ref 5–15)
BUN: 25 mg/dL — ABNORMAL HIGH (ref 8–23)
CO2: 37 mmol/L — ABNORMAL HIGH (ref 22–32)
Calcium: 8.2 mg/dL — ABNORMAL LOW (ref 8.9–10.3)
Chloride: 90 mmol/L — ABNORMAL LOW (ref 98–111)
Creatinine, Ser: 1.24 mg/dL (ref 0.61–1.24)
GFR, Estimated: >60 mL/min (ref >60)
Glucose, Bld: 124 mg/dL — ABNORMAL HIGH (ref 70–99)
Potassium: 3.6 mmol/L (ref 3.5–5.1)
Sodium: 135 mmol/L (ref 135–145)

## 2021-04-12 LAB — GLUCOSE, CAPILLARY: Glucose-Capillary: 107 mg/dL — ABNORMAL HIGH (ref 70–99)

## 2021-04-12 MED ORDER — METOPROLOL SUCCINATE ER 25 MG PO TB24
25.0000 mg | ORAL_TABLET | Freq: Every day | ORAL | Status: DC
  Administered 2021-04-13 – 2021-04-16 (×4): 25 mg via ORAL
  Filled 2021-04-12 (×4): qty 1

## 2021-04-12 MED ORDER — LOSARTAN POTASSIUM 25 MG PO TABS
25.0000 mg | ORAL_TABLET | Freq: Every day | ORAL | Status: DC
  Administered 2021-04-12 – 2021-04-16 (×5): 25 mg via ORAL
  Filled 2021-04-12 (×5): qty 1

## 2021-04-12 MED ORDER — SPIRONOLACTONE 25 MG PO TABS
25.0000 mg | ORAL_TABLET | Freq: Every day | ORAL | Status: DC
  Administered 2021-04-13 – 2021-04-16 (×4): 25 mg via ORAL
  Filled 2021-04-12 (×4): qty 1

## 2021-04-12 MED ORDER — METOPROLOL SUCCINATE ER 25 MG PO TB24: 25.0000 mg | ORAL_TABLET | Freq: Every day | ORAL | Status: DC

## 2021-04-12 NOTE — Progress Notes (Signed)
PROGRESS NOTE    Jose Fitzpatrick  VEL:[id number] DOB: 07-Jun-1946 DOA: 04/10/2021 PCP: Tamsen Roers, MD    Brief Narrative:  Jose Fitzpatrick is a 75 year old male with past medical history significant for essential hypertension, morbid obesity, peripheral neuropathy who presented to the ED on 5/10 with shortness of breath and worsening lower extremity edema.  Patient reports onset of symptoms over the past 2 days with associated difficulty lying flat and sleeping on incline.  He also describes weeping of fluids on his lower extremities.  Denies chest pain, no palpitations or chest pain.  No previous cardiac history.  Denies heavy alcohol use or illicit drug use.  In the ED, patient was noted to be in atrial fibrillation with RVR with rates of 280.  BNP 88, high sensitive troponin 12.  Chest x-ray with mild cardiomegaly and vascular congestion/pulmonary edema.  Patient was given 20 mg IV diltiazem bolus and started on diltiazem infusion.  Patient was also given 60 mg of IV Lasix with over 1 L output.  Cardiology was consulted.  TRH consulted for further evaluation and management of new onset atrial fibrillation with RVR.   Assessment & Plan:   Principal Problem:   Atrial fibrillation with RVR (HCC) Active Problems:   HTN (hypertension)   Acute CHF (congestive heart failure) (HCC)   Acute respiratory failure with hypoxia (HCC)   Elevated TSH   BMI 50.0-59.9, adult (HCC)   Hypercapnic respiratory failure (HCC)   Pressure injury of skin   Atrial fibrillation with RVR, new diagnosis Patient presenting to ED with progressive shortness of breath and lower extremity edema.  On arrival, patient was noted to be in A. fib with RVR.  TSH 4.870 on 5/11.  Underwent DCCV 5/16 and 5/23, unfortunately reconverted back to A. fib on 5/24. --Amiodarone drip --Metoprolol succinate 25 mg p.o. daily. --Digoxin 0.25 mg p.o. daily --Eliquis 5 mg p.o. twice daily --EP recommends repeat DCCV once patient  euvolemic  New onset acute systolic congestive heart failure Acute hypoxic respiratory failure, POA Patient presenting to ED with progressive shortness of breath and lower extremity edema.  Hypoxic on presentation.  Chest x-ray with interstitial pulmonary edema and small bilateral pleural effusions.  TTE with LVEF 25-30%.  Suspect possibly related to arrhythmia. --net negative 3.3L past 24 hrs and net negative 37.4L since admission --wt 181.4>>184.7>>166.7>>176.7>175.3>172.2kg --Furosemide 120 mg IV every 12 hours --Diamox 250mg  PO BID --Metoprolol succinate 25 mg p.o. daily --Losartan 25 mg p.o. daily --UNNA boots --Strict I's and O's and daily weights --Continue supplemental oxygen, maintain SPO2 greater than 92%, currently on 3.5 L nasal cannula --BMP daily  Acute renal failure --Cr 1.02>>>1.44>1.22>>1.04>1.15>1.13>1.24>1.16>1.10>1.24 --BMP daily  Hx Essential hypertension BP 99/61 this morning.  Likely complicated by persistent A. fib with RVR.  Required transfer to ICU on pressure support on 5/20; possibly due to aggressive diuresis in the setting of taking heart failure medications and A. fib with RVR. --Metoprolol succinate 25 mg p.o. daily --Holding Aldactone and Entresto --Hope BP will improve with better rate control --Continue monitor BP closely  Subclinical hypothyroidism TSH slightly elevated 4.870 with normal free T3/4.  EtOH use disorder Counseled on need for complete cessation. --Thiamine, B12, multivitamin, folic acid  Morbid obesity Body mass index is 46.73 kg/m.  Discussed with patient needs for aggressive lifestyle changes/weight loss as this complicates all facets of care.  Outpatient follow-up with PCP.  May benefit from bariatric evaluation outpatient.  Pressure injury bilateral ears, POA Pressure Injury 04/05/21 Ear Left  Stage 2 -  Partial thickness loss of dermis presenting as a shallow open injury with a red, pink wound bed without slough. from oxygen  tubing (Active)  04/05/21 1312  Location: Ear  Location Orientation: Left  Staging: Stage 2 -  Partial thickness loss of dermis presenting as a shallow open injury with a red, pink wound bed without slough.  Wound Description (Comments): from oxygen tubing  Present on Admission: Yes     Pressure Injury 04/05/21 Ear Right Stage 2 -  Partial thickness loss of dermis presenting as a shallow open injury with a red, pink wound bed without slough. from oxygen tubing (Active)  04/05/21 1317  Location: Ear  Location Orientation: Right  Staging: Stage 2 -  Partial thickness loss of dermis presenting as a shallow open injury with a red, pink wound bed without slough.  Wound Description (Comments): from oxygen tubing  Present on Admission: Yes  --Continue local wound care  Weakness/deconditioning/debility: --Continue PT/OT efforts while inpatient   DVT prophylaxis:  apixaban (ELIQUIS) tablet 5 mg    Code Status: DNR Family Communication: No family present at bedside this morning, updated patient's son at bedside yesterday afternoon.  Disposition Plan:  Level of care: Progressive Status is: Inpatient  Remains inpatient appropriate because:Ongoing diagnostic testing needed not appropriate for outpatient work up, Unsafe d/c plan, IV treatments appropriate due to intensity of illness or inability to take PO and Inpatient level of care appropriate due to severity of illness   Dispo: The patient is from: Home              Anticipated d/c is to: SNF              Patient currently is not medically stable to d/c.   Difficult to place patient No  Consultants:   Cardiology  Procedures:   TTE 5/13: LVEF 35%  TEE/DCCV 5/16  TEE/DCCV 5/23  Antimicrobials:   none   Subjective: Patient seen examined at bedside, resting comfortably.  Continues with shortness of breath at rest.  Continues with good urine output with IV furosemide.  No family present at bedside this morning.  No other  questions or concerns at this time.  Denies headache, no fever/chills/night sweats, no nausea/vomiting/diarrhea, no active chest pain, no palpitations, no current shortness of breath, no abdominal pain.  No other acute events overnight per nursing staff.  Objective: Vitals:   04/12/21 0030 04/12/21 0337 04/12/21 0750 04/12/21 0942  BP: 118/81 115/71 111/75   Pulse: (!) 118 68 67 (!) 133  Resp: 19 16 16    Temp: 97.9 F (36.6 C) 98 F (36.7 C) 98 F (36.7 C)   TempSrc: Oral Oral Oral   SpO2: 94% 98% 94%   Weight:  (!) 169.6 kg    Height:        Intake/Output Summary (Last 24 hours) at 04/12/2021 1156 Last data filed at 04/12/2021 0434 Gross per 24 hour  Intake 370 ml  Output 3100 ml  Net -2730 ml   Filed Weights   04/10/21 0431 04/11/21 0432 04/12/21 0337  Weight: (!) 175.3 kg (!) 172.2 kg (!) 169.6 kg    Examination:  General exam: Appears calm and comfortable, obese, chronically ill in appearance Respiratory system: Decreased breath sounds bilateral bases without crackles/wheezing, normal Respaire effort, on 3.5 L nasal cannula with SPO2 97% Cardiovascular system: S1 & S2 heard, irregularly irregular rhythm, tachycardic. No JVD, murmurs, rubs, gallops or clicks.  Significant pedal edema up to thighs with Louretta Parma  boots in place Gastrointestinal system: Abdomen is nondistended, soft and nontender. No organomegaly or masses felt. Normal bowel sounds heard. Central nervous system: Alert and oriented. No focal neurological deficits. Extremities: Symmetric 5 x 5 power.  Unna boots in place. Skin: No rashes, lesions or ulcers Psychiatry: Judgement and insight appear poor. Mood & affect appropriate.     Data Reviewed: I have personally reviewed following labs and imaging studies  CBC: Recent Labs  Lab 03/20/2021 0302  WBC 7.3  HGB 16.8  HCT 51.9  MCV 99.4  PLT 195   Basic Metabolic Panel: Recent Labs  Lab 04/05/21 1509 04/11/2021 0302 04/07/21 0213 04/08/21 0459  04/09/21 0410 04/10/21 0434 04/11/21 0542 04/12/21 0459  NA 132* 132*   < > 136 133* 132* 135 135  K 4.3 4.1   < > 4.1 3.8 3.4* 3.5 3.6  CL 83* 86*   < > 87* 84* 84* 88* 90*  CO2 40* 40*   < > 39* 43* 42* 39* 37*  GLUCOSE 208* 97   < > 103* 108* 114* 101* 124*  BUN 29* 24*   < > 22 26* 24* 25* 25*  CREATININE 1.12 1.11   < > 1.13 1.24 1.16 1.10 1.24  CALCIUM 8.0* 8.4*   < > 8.4* 8.0* 8.1* 8.0* 8.2*  MG 2.0 2.1  --  1.9 2.1 2.1 2.1  --   PHOS 3.1 3.0  --   --   --   --   --   --    < > = values in this interval not displayed.   GFR: Estimated Creatinine Clearance: 86.3 mL/min (by C-G formula based on SCr of 1.24 mg/dL). Liver Function Tests: Recent Labs  Lab 04/09/21 0410  AST 27  ALT 25  ALKPHOS 48  BILITOT 1.3*  PROT 5.7*  ALBUMIN 2.2*   No results for input(s): LIPASE, AMYLASE in the last 168 hours. No results for input(s): AMMONIA in the last 168 hours. Coagulation Profile: Recent Labs  Lab 04/08/2021 0734  INR 1.4*   Cardiac Enzymes: No results for input(s): CKTOTAL, CKMB, CKMBINDEX, TROPONINI in the last 168 hours. BNP (last 3 results) No results for input(s): PROBNP in the last 8760 hours. HbA1C: No results for input(s): HGBA1C in the last 72 hours. CBG: Recent Labs  Lab 04/10/21 2129 04/12/21 0745  GLUCAP 121* 107*   Lipid Profile: No results for input(s): CHOL, HDL, LDLCALC, TRIG, CHOLHDL, LDLDIRECT in the last 72 hours. Thyroid Function Tests: No results for input(s): TSH, T4TOTAL, FREET4, T3FREE, THYROIDAB in the last 72 hours. Anemia Panel: No results for input(s): VITAMINB12, FOLATE, FERRITIN, TIBC, IRON, RETICCTPCT in the last 72 hours. Sepsis Labs: Recent Labs  Lab 03/21/2021 0302  PROCALCITON <0.10    Recent Results (from the past 240 hour(s))  Culture, blood (routine x 2)     Status: None   Collection Time: 04/04/21  1:27 PM   Specimen: BLOOD  Result Value Ref Range Status   Specimen Description   Final    BLOOD LEFT  ANTECUBITAL Performed at [facility] [address], [place], [place] [address]    Special Requests   Final    BOTTLES DRAWN AEROBIC ONLY Blood Culture results may not be optimal due to an excessive volume of blood received in culture bottles Performed at [facility] [address] [place], [place] [address]    Culture   Final    NO GROWTH 5 DAYS Performed at [facility], [place] [address], [place], [place] [address]    Report Status 04/09/2021 FINAL  Final  Culture, blood (routine x 2)     Status: None   Collection Time: 04/04/21  1:27 PM   Specimen: BLOOD  Result Value Ref Range Status   Specimen Description   Final    BLOOD LEFT ANTECUBITAL Performed at [facility] [address] [place], [place] [address]    Special Requests   Final    BOTTLES DRAWN AEROBIC ONLY Blood Culture results may not be optimal due to an excessive volume of blood received in culture bottles Performed at [facility] [address], [place], [place] [address]    Culture   Final    NO GROWTH 5 DAYS Performed at [facility], [place] [address], [place], [place] [address]    Report Status 04/09/2021 FINAL  Final  Culture, Urine     Status: Abnormal   Collection Time: 04/04/21  6:43 PM   Specimen: Urine, Clean Catch  Result Value Ref Range Status   Specimen Description   Final    URINE, CLEAN CATCH Performed at [facility], [place] [address], [place], [place] [address]    Special Requests   Final    NONE Performed at [facility], [place] [address], [place], [place] [address]    Culture (A)  Final    >=100,000 COLONIES/mL ESCHERICHIA COLI 80,000 COLONIES/mL ENTEROCOCCUS FAECALIS Confirmed Extended Spectrum Beta-Lactamase Producer (ESBL).  In bloodstream infections from ESBL organisms, carbapenems are preferred over piperacillin/tazobactam. They are shown to have a lower risk  of mortality.    Report Status 04/07/2021 FINAL  Final   Organism ID, Bacteria ESCHERICHIA COLI (A)  Final   Organism ID, Bacteria ENTEROCOCCUS FAECALIS (A)  Final      Susceptibility   Escherichia coli - MIC*    AMPICILLIN >=32 RESISTANT Resistant     CEFAZOLIN >=64 RESISTANT Resistant     CEFEPIME 8 INTERMEDIATE Intermediate     CEFTRIAXONE >=64 RESISTANT Resistant     CIPROFLOXACIN <=0.25 SENSITIVE Sensitive     GENTAMICIN <=1 SENSITIVE Sensitive     IMIPENEM <=0.25 SENSITIVE Sensitive     NITROFURANTOIN <=16 SENSITIVE Sensitive     TRIMETH/SULFA <=20 SENSITIVE Sensitive     AMPICILLIN/SULBACTAM >=32 RESISTANT Resistant     PIP/TAZO <=4 SENSITIVE Sensitive     * >=100,000 COLONIES/mL ESCHERICHIA COLI   Enterococcus faecalis - MIC*    AMPICILLIN <=2 SENSITIVE Sensitive     NITROFURANTOIN <=16 SENSITIVE Sensitive     VANCOMYCIN 1 SENSITIVE Sensitive     * 80,000 COLONIES/mL ENTEROCOCCUS FAECALIS         Radiology Studies: No results found.      Scheduled Meds: . acetaZOLAMIDE  250 mg Oral BID  . apixaban  5 mg Oral BID  . bacitracin   Topical BID  . Chlorhexidine Gluconate Cloth  6 each Topical Daily  . digoxin  0.25 mg Oral Daily  . folic acid  1 mg Oral Daily  . gabapentin  600 mg Oral QPM  . losartan  25 mg Oral Daily  . mouth rinse  15 mL Mouth Rinse BID  . [START ON 04/13/2021] metoprolol succinate  25 mg Oral Daily  . multivitamin with minerals  1 tablet Oral Daily  . potassium chloride  40 mEq Oral BID  . sodium chloride flush  10-40 mL Intracatheter Q12H  . [START ON 04/13/2021] spironolactone  25 mg Oral Daily  . testosterone cypionate  200 mg Intramuscular Q14 Days  . thiamine  100 mg Oral Daily   Or  . thiamine  100 mg Intravenous Daily  . vitamin B-12  1,000 mcg Oral Daily   Continuous Infusions: . sodium chloride 500 mL (04/10/21 2133)  . sodium chloride    . amiodarone 30 mg/hr (04/11/21 2311)  . furosemide 120 mg (04/12/21 1115)     LOS:  19 days    Time spent: 37 minutes spent on chart review, discussion with nursing staff, consultants, updating family and interview/physical exam; more than 50% of that time was spent in counseling and/or coordination of care.    Kaynan Klonowski J [place] DO [facility] Available via Epic secure chat 7am-7pm After these hours, please refer to coverage provider listed on [email or web address] 04/12/2021, 11:56 AM

## 2021-04-12 NOTE — Progress Notes (Signed)
Progress Note  Patient Name: Jose Fitzpatrick Date of Encounter: 04/12/2021  [facility] Cardiologist: Kirk Ruths, MD   Subjective   Pt denies CP or dyspnea; no new complaints  Inpatient Medications    Scheduled Meds: . acetaZOLAMIDE  250 mg Oral BID  . apixaban  5 mg Oral BID  . bacitracin   Topical BID  . Chlorhexidine Gluconate Cloth  6 each Topical Daily  . digoxin  0.25 mg Oral Daily  . folic acid  1 mg Oral Daily  . gabapentin  600 mg Oral QPM  . mouth rinse  15 mL Mouth Rinse BID  . metoprolol tartrate  25 mg Oral BID  . multivitamin with minerals  1 tablet Oral Daily  . potassium chloride  40 mEq Oral BID  . sodium chloride flush  10-40 mL Intracatheter Q12H  . spironolactone  12.5 mg Oral Daily  . testosterone cypionate  200 mg Intramuscular Q14 Days  . thiamine  100 mg Oral Daily   Or  . thiamine  100 mg Intravenous Daily  . vitamin B-12  1,000 mcg Oral Daily   Continuous Infusions: . sodium chloride 500 mL (04/10/21 2133)  . sodium chloride    . amiodarone 30 mg/hr (04/11/21 2311)  . furosemide 120 mg (04/11/21 1835)   PRN Meds: sodium chloride, Place/Maintain arterial line **AND** sodium chloride, acetaminophen, docusate sodium, polyethylene glycol, sodium chloride flush   Vital Signs    Vitals:   04/12/21 0030 04/12/21 0337 04/12/21 0750 04/12/21 0942  BP: 118/81 115/71 111/75   Pulse: (!) 118 68 67 (!) 133  Resp: 19 16 16    Temp: 97.9 F (36.6 C) 98 F (36.7 C) 98 F (36.7 C)   TempSrc: Oral Oral Oral   SpO2: 94% 98% 94%   Weight:  (!) 169.6 kg    Height:        Intake/Output Summary (Last 24 hours) at 04/12/2021 0949 Last data filed at 04/12/2021 0434 Gross per 24 hour  Intake 370 ml  Output 3700 ml  Net -3330 ml   Last 3 Weights 04/12/2021 04/11/2021 04/10/2021  Weight (lbs) 373 lb 14.4 oz 379 lb 10.1 oz 386 lb 7.5 oz  Weight (kg) 169.6 kg 172.2 kg 175.3 kg      Telemetry    Atrial fibrillation; rate elevated- Personally  Reviewed   Physical Exam   GEN: NAD.  Obese Neck: supple, JVP difficult to assess Cardiac: irregular and tachycardic; no gallop Respiratory: CTA anteriorly GI: Soft, obese MS: chronic skin changes; 2+ edema Neuro:  grossly intact Psych: Normal affect   Labs    High Sensitivity Troponin:   Recent Labs  Lab 03/26/2021 2023 04/10/2021 2147  TROPONINIHS 12 12      Chemistry Recent Labs  Lab 04/09/21 0410 04/10/21 0434 04/11/21 0542 04/12/21 0459  NA 133* 132* 135 135  K 3.8 3.4* 3.5 3.6  CL 84* 84* 88* 90*  CO2 43* 42* 39* 37*  GLUCOSE 108* 114* 101* 124*  BUN 26* 24* 25* 25*  CREATININE 1.24 1.16 1.10 1.24  CALCIUM 8.0* 8.1* 8.0* 8.2*  PROT 5.7*  --   --   --   ALBUMIN 2.2*  --   --   --   AST 27  --   --   --   ALT 25  --   --   --   ALKPHOS 48  --   --   --   BILITOT 1.3*  --   --   --  GFRNONAA >60 >60 >60 >60  ANIONGAP 6 6 8 8      Hematology Recent Labs  Lab 03/20/2021 0302  WBC 7.3  RBC 5.22  HGB 16.8  HCT 51.9  MCV 99.4  MCH 32.2  MCHC 32.4  RDW 12.9  PLT 174    BNP Recent Labs  Lab 04/05/2021 0302  BNP 77.5     Patient Profile     Ranbir Chew Carpenteris a 75 y.o.malewith hypertension, morbid obesity, chronic venous insufficiency, possible alcohol abuse who was admitted on 04/13/2021 with acute decompensated systolic heart failure. Found to be in atrial flutter as well as afib with RVR.Patient had TEE guided cardioversion on May 16 (ejection fraction 25 to 30%, moderate RV dysfunction, moderate left atrial enlargement, moderate right atrial enlargement, mild mitral regurgitation).  He did not hold sinus rhythm.  Had repeat cardioversion May 23 but atrial fibrillation again recurred.  Assessment & Plan    1 acute systolic congestive heart failure-I/O-3330. Wt 169.6 kg.  Volume status slowly improving.  Still significant amount of diuresis needed.  Continue Lasix and spironolactone.  Follow renal function.  Note Diamox added previously for  possible contraction alkalosis.  2 cardiomyopathy-potentially tachycardia mediated with possible contribution from alcohol.  He became hypotensive on Entresto previously.  Blood pressure remains borderline.  Change metoprolol to Toprol 25 mg daily.  Add losartan 25 mg daily to see if blood pressure tolerates.  Will need echocardiogram 3 months after heart rate controlled to see if LV function has improved.  If not would need ischemia evaluation.  3 atrial fibrillation/flutter-patient remains in atrial fibrillation and heart rate elevated.  Sinus was reestablished previously on 2 separate occasions with cardioversion but atrial fibrillation recurred.  We will continue amiodarone, beta-blocker and digoxin (will need to follow digoxin level particularly given amiodarone interaction).  Hopefully as more amiodarone is in his system and CHF improves he will hold sinus rhythm.  We will likely plan reattempt at cardioversion late next week.    4 hypertension-blood pressure has improved.   5 obesity-he will need an outpatient sleep study and also needs weight loss.  For questions or updates, please contact [facility] Please consult [email or web address] for contact info under        Signed, Kirk Ruths, MD  04/12/2021, 9:49 AM

## 2021-04-13 DIAGNOSIS — I4891 Unspecified atrial fibrillation: Secondary | ICD-10-CM | POA: Diagnosis not present

## 2021-04-13 LAB — BASIC METABOLIC PANEL
Anion gap: 8 (ref 5–15)
BUN: 25 mg/dL — ABNORMAL HIGH (ref 8–23)
CO2: 38 mmol/L — ABNORMAL HIGH (ref 22–32)
Calcium: 8.5 mg/dL — ABNORMAL LOW (ref 8.9–10.3)
Chloride: 90 mmol/L — ABNORMAL LOW (ref 98–111)
Creatinine, Ser: 1.08 mg/dL (ref 0.61–1.24)
GFR, Estimated: >60 mL/min (ref >60)
Glucose, Bld: 119 mg/dL — ABNORMAL HIGH (ref 70–99)
Potassium: 4 mmol/L (ref 3.5–5.1)
Sodium: 136 mmol/L (ref 135–145)

## 2021-04-13 LAB — MAGNESIUM: Magnesium: 2.5 mg/dL — ABNORMAL HIGH (ref 1.7–2.4)

## 2021-04-13 MED ORDER — ZINC OXIDE 12.8 % EX OINT
TOPICAL_OINTMENT | Freq: Three times a day (TID) | CUTANEOUS | Status: DC
  Administered 2021-04-14 – 2021-04-16 (×3): 1 via TOPICAL
  Filled 2021-04-13 (×2): qty 56.7

## 2021-04-13 NOTE — TOC Progression Note (Signed)
Transition of Care [facility]) - Progression Note    Patient Details  Name: Jose Fitzpatrick MRN: [id number] Date of Birth: 04-Jan-1946  Transition of Care [facility]) CM/SW [place], [place] Phone Number: 04/13/2021, 12:47 PM  Clinical Narrative:     CSW spoke with patients Granddaughter Cort[facility] and gave SNF bed offer for patient. [facility] accepted SNF bed offer with [facility].CSW spoke with Juliann Pulse with [facility] who confirmed they can accept patient for SNF placement when medically ready. CSW will start insurance authorization close to patient being medically ready. CSW will continue to follow and assist with discharge planning needs.   Expected Discharge Plan: Home/Self Care Barriers to Discharge: Continued Medical Work up  Expected Discharge Plan and Services Expected Discharge Plan: Home/Self Care   Discharge Planning Services: CM Consult   Living arrangements for the past 2 months: Single Family Home                                       Social Determinants of Health (SDOH) Interventions    Readmission Risk Interventions No flowsheet data found.

## 2021-04-13 NOTE — Progress Notes (Signed)
PROGRESS NOTE    Jose Fitzpatrick  LKG:[id number] DOB: 10/01/1946 DOA: 03/21/2021 PCP: Tamsen Roers, MD    Brief Narrative:  Jose Fitzpatrick is a 75 year old male with past medical history significant for essential hypertension, morbid obesity, peripheral neuropathy who presented to the ED on 5/10 with shortness of breath and worsening lower extremity edema.  Patient reports onset of symptoms over the past 2 days with associated difficulty lying flat and sleeping on incline.  He also describes weeping of fluids on his lower extremities.  Denies chest pain, no palpitations or chest pain.  No previous cardiac history.  Denies heavy alcohol use or illicit drug use.  In the ED, patient was noted to be in atrial fibrillation with RVR with rates of 280.  BNP 88, high sensitive troponin 12.  Chest x-ray with mild cardiomegaly and vascular congestion/pulmonary edema.  Patient was given 20 mg IV diltiazem bolus and started on diltiazem infusion.  Patient was also given 60 mg of IV Lasix with over 1 L output.  Cardiology was consulted.  TRH consulted for further evaluation and management of new onset atrial fibrillation with RVR.   Assessment & Plan:   Principal Problem:   Atrial fibrillation with RVR (HCC) Active Problems:   HTN (hypertension)   Acute CHF (congestive heart failure) (HCC)   Acute respiratory failure with hypoxia (HCC)   Elevated TSH   BMI 50.0-59.9, adult (HCC)   Hypercapnic respiratory failure (HCC)   Pressure injury of skin   Atrial fibrillation with RVR, new diagnosis Patient presenting to ED with progressive shortness of breath and lower extremity edema.  On arrival, patient was noted to be in A. fib with RVR.  TSH 4.870 on 5/11.  Underwent DCCV 5/16 and 5/23, unfortunately reconverted back to A. fib on 5/24. --Amiodarone drip --Metoprolol succinate 25 mg p.o. daily. --Digoxin 0.25 mg p.o. daily --Eliquis 5 mg p.o. twice daily --EP recommends repeat DCCV once patient  euvolemic  New onset acute systolic congestive heart failure Acute hypoxic respiratory failure, POA Patient presenting to ED with progressive shortness of breath and lower extremity edema.  Hypoxic on presentation.  Chest x-ray with interstitial pulmonary edema and small bilateral pleural effusions.  TTE with LVEF 25-30%.  Suspect possibly related to arrhythmia. --net positive 1.3L past 24 hrs with 1 unmeasured urinary recurrence and net negative 36.1L since admission --wt 181.4>>184.7>>166.7>>176.7>175.3>172.2>168.4kg --Furosemide 120 mg IV every 12 hours --Diamox 250mg  PO BID --Metoprolol succinate 25 mg p.o. daily --Losartan 25 mg p.o. daily --UNNA boots --Strict I's and O's and daily weights --Continue supplemental oxygen, maintain SPO2 greater than 92%, currently on 3.5 L nasal cannula --BMP daily  Acute renal failure --Cr 1.02>>>1.44>1.22>>1.04>1.15>1.13>1.24>>1.24>1.08 --BMP daily  Hx Essential hypertension BP 99/61 this morning.  Likely complicated by persistent A. fib with RVR.  Required transfer to ICU on pressure support on 5/20; possibly due to aggressive diuresis in the setting of taking heart failure medications and A. fib with RVR. --Metoprolol succinate 25 mg p.o. daily --Holding Aldactone and Entresto --Hope BP will improve with better rate control --Continue monitor BP closely  Subclinical hypothyroidism TSH slightly elevated 4.870 with normal free T3/4.  EtOH use disorder Counseled on need for complete cessation. --Thiamine, B12, multivitamin, folic acid  Morbid obesity Body mass index is 46.4 kg/m.  Discussed with patient needs for aggressive lifestyle changes/weight loss as this complicates all facets of care.  Outpatient follow-up with PCP.  May benefit from bariatric evaluation outpatient.  Pressure injury bilateral ears, POA  Pressure Injury 04/05/21 Ear Left Stage 2 -  Partial thickness loss of dermis presenting as a shallow open injury with a red, pink  wound bed without slough. from oxygen tubing (Active)  04/05/21 1312  Location: Ear  Location Orientation: Left  Staging: Stage 2 -  Partial thickness loss of dermis presenting as a shallow open injury with a red, pink wound bed without slough.  Wound Description (Comments): from oxygen tubing  Present on Admission: Yes     Pressure Injury 04/05/21 Ear Right Stage 2 -  Partial thickness loss of dermis presenting as a shallow open injury with a red, pink wound bed without slough. from oxygen tubing (Active)  04/05/21 1317  Location: Ear  Location Orientation: Right  Staging: Stage 2 -  Partial thickness loss of dermis presenting as a shallow open injury with a red, pink wound bed without slough.  Wound Description (Comments): from oxygen tubing  Present on Admission: Yes     Pressure Injury 04/13/21 Sacrum Right;Left;Mid Deep Tissue Pressure Injury - Purple or maroon localized area of discolored intact skin or blood-filled blister due to damage of underlying soft tissue from pressure and/or shear. (Active)  04/13/21 0400  Location: Sacrum  Location Orientation: Right;Left;Mid  Staging: Deep Tissue Pressure Injury - Purple or maroon localized area of discolored intact skin or blood-filled blister due to damage of underlying soft tissue from pressure and/or shear.  Wound Description (Comments):   Present on Admission:   --Continue local wound care  Weakness/deconditioning/debility: --Continue PT/OT efforts while inpatient --Accepted ot [facility] SN[facility] when medically ready for discharge  DVT prophylaxis:  apixaban (ELIQUIS) tablet 5 mg    Code Status: DNR Family Communication: No family present at bedside this morning, updated patient's son at bedside yesterday afternoon.  Disposition Plan:  Level of care: Progressive Status is: Inpatient  Remains inpatient appropriate because:Ongoing diagnostic testing needed not appropriate for outpatient work up, Unsafe d/c plan, IV treatments  appropriate due to intensity of illness or inability to take PO and Inpatient level of care appropriate due to severity of illness   Dispo: The patient is from: Home              Anticipated d/c is to: SNF [facility]              Patient currently is not medically stable to d/c.   Difficult to place patient No  Consultants:   Cardiology  Procedures:   TTE 5/13: LVEF 35%  TEE/DCCV 5/16  TEE/DCCV 5/23  Antimicrobials:   none   Subjective: Patient seen examined at bedside, resting comfortably.  Continues with shortness of breath at rest.  No complaints, questions or concerns at this time.  Denies headache, no fever/chills/night sweats, no nausea/vomiting/diarrhea, no active chest pain, no palpitations, no current shortness of breath, no abdominal pain.  No other acute events overnight per nursing staff.  Objective: Vitals:   04/12/21 2004 04/12/21 2347 04/13/21 0347 04/13/21 0940  BP: 105/63 122/63  108/67  Pulse:  (!) 114 (!) 119 (!) 110  Resp: 15 18 15 16   Temp: (!) 97.5 F (36.4 C) 97.6 F (36.4 C) 97.9 F (36.6 C) 97.8 F (36.6 C)  TempSrc: Oral Axillary Oral Oral  SpO2: 96% 96% 97%   Weight:   (!) 168.4 kg   Height:        Intake/Output Summary (Last 24 hours) at 04/13/2021 1308 Last data filed at 04/13/2021 0349 Gross per 24 hour  Intake 2016.33 ml  Output 700 ml  Net 1316.33 ml   Filed Weights   04/11/21 0432 04/12/21 0337 04/13/21 0347  Weight: (!) 172.2 kg (!) 169.6 kg (!) 168.4 kg    Examination:  General exam: Appears calm and comfortable, obese, chronically ill in appearance Respiratory system: Decreased breath sounds bilateral bases without crackles/wheezing, normal Respaire effort, on 3.5 L nasal cannula with SPO2 97% Cardiovascular system: S1 & S2 heard, irregularly irregular rhythm, tachycardic. No JVD, murmurs, rubs, gallops or clicks.  Significant pedal edema up to thighs with Unna boots in place Gastrointestinal system: Abdomen is  nondistended, soft and nontender. No organomegaly or masses felt. Normal bowel sounds heard. Central nervous system: Alert and oriented. No focal neurological deficits. Extremities: Symmetric 5 x 5 power.  Unna boots in place. Skin: No rashes, lesions or ulcers Psychiatry: Judgement and insight appear poor. Mood & affect appropriate.     Data Reviewed: I have personally reviewed following labs and imaging studies  CBC: No results for input(s): WBC, NEUTROABS, HGB, HCT, MCV, PLT in the last 168 hours. Basic Metabolic Panel: Recent Labs  Lab 04/08/21 0459 04/09/21 0410 04/10/21 0434 04/11/21 0542 04/12/21 0459 04/13/21 0403  NA 136 133* 132* 135 135 136  K 4.1 3.8 3.4* 3.5 3.6 4.0  CL 87* 84* 84* 88* 90* 90*  CO2 39* 43* 42* 39* 37* 38*  GLUCOSE 103* 108* 114* 101* 124* 119*  BUN 22 26* 24* 25* 25* 25*  CREATININE 1.13 1.24 1.16 1.10 1.24 1.08  CALCIUM 8.4* 8.0* 8.1* 8.0* 8.2* 8.5*  MG 1.9 2.1 2.1 2.1  --  2.5*   GFR: Estimated Creatinine Clearance: 98.7 mL/min (by C-G formula based on SCr of 1.08 mg/dL). Liver Function Tests: Recent Labs  Lab 04/09/21 0410  AST 27  ALT 25  ALKPHOS 48  BILITOT 1.3*  PROT 5.7*  ALBUMIN 2.2*   No results for input(s): LIPASE, AMYLASE in the last 168 hours. No results for input(s): AMMONIA in the last 168 hours. Coagulation Profile: No results for input(s): INR, PROTIME in the last 168 hours. Cardiac Enzymes: No results for input(s): CKTOTAL, CKMB, CKMBINDEX, TROPONINI in the last 168 hours. BNP (last 3 results) No results for input(s): PROBNP in the last 8760 hours. HbA1C: No results for input(s): HGBA1C in the last 72 hours. CBG: Recent Labs  Lab 04/10/21 2129 04/12/21 0745  GLUCAP 121* 107*   Lipid Profile: No results for input(s): CHOL, HDL, LDLCALC, TRIG, CHOLHDL, LDLDIRECT in the last 72 hours. Thyroid Function Tests: No results for input(s): TSH, T4TOTAL, FREET4, T3FREE, THYROIDAB in the last 72 hours. Anemia  Panel: No results for input(s): VITAMINB12, FOLATE, FERRITIN, TIBC, IRON, RETICCTPCT in the last 72 hours. Sepsis Labs: No results for input(s): PROCALCITON, LATICACIDVEN in the last 168 hours.  Recent Results (from the past 240 hour(s))  Culture, blood (routine x 2)     Status: None   Collection Time: 04/04/21  1:27 PM   Specimen: BLOOD  Result Value Ref Range Status   Specimen Description   Final    BLOOD LEFT ANTECUBITAL Performed at [facility] [address] [place], [place] [address]    Special Requests   Final    BOTTLES DRAWN AEROBIC ONLY Blood Culture results may not be optimal due to an excessive volume of blood received in culture bottles Performed at [facility] [address], [place], [place] [address]    Culture   Final    NO GROWTH 5 DAYS Performed  at [facility], [place] [address] [place], [place] [address]    Report Status 04/09/2021 FINAL  Final  Culture, blood (routine x 2)     Status: None   Collection Time: 04/04/21  1:27 PM   Specimen: BLOOD  Result Value Ref Range Status   Specimen Description   Final    BLOOD LEFT ANTECUBITAL Performed at [facility] [address], [place], [place] [address]    Special Requests   Final    BOTTLES DRAWN AEROBIC ONLY Blood Culture results may not be optimal due to an excessive volume of blood received in culture bottles Performed at [facility] [address] [place], [place] [address]    Culture   Final    NO GROWTH 5 DAYS Performed at [facility], [place] [address] [place], [place] [address]    Report Status 04/09/2021 FINAL  Final  Culture, Urine     Status: Abnormal   Collection Time: 04/04/21  6:43 PM   Specimen: Urine, Clean Catch  Result Value Ref Range Status   Specimen Description   Final    URINE, CLEAN CATCH Performed at [facility], [place] [address] [place], [place] [address]    Special  Requests   Final    NONE Performed at [facility], [place] [address] [place], [place] [address]    Culture (A)  Final    >=100,000 COLONIES/mL ESCHERICHIA COLI 80,000 COLONIES/mL ENTEROCOCCUS FAECALIS Confirmed Extended Spectrum Beta-Lactamase Producer (ESBL).  In bloodstream infections from ESBL organisms, carbapenems are preferred over piperacillin/tazobactam. They are shown to have a lower risk of mortality.    Report Status 04/07/2021 FINAL  Final   Organism ID, Bacteria ESCHERICHIA COLI (A)  Final   Organism ID, Bacteria ENTEROCOCCUS FAECALIS (A)  Final      Susceptibility   Escherichia coli - MIC*    AMPICILLIN >=32 RESISTANT Resistant     CEFAZOLIN >=64 RESISTANT Resistant     CEFEPIME 8 INTERMEDIATE Intermediate     CEFTRIAXONE >=64 RESISTANT Resistant     CIPROFLOXACIN <=0.25 SENSITIVE Sensitive     GENTAMICIN <=1 SENSITIVE Sensitive     IMIPENEM <=0.25 SENSITIVE Sensitive     NITROFURANTOIN <=16 SENSITIVE Sensitive     TRIMETH/SULFA <=20 SENSITIVE Sensitive     AMPICILLIN/SULBACTAM >=32 RESISTANT Resistant     PIP/TAZO <=4 SENSITIVE Sensitive     * >=100,000 COLONIES/mL ESCHERICHIA COLI   Enterococcus faecalis - MIC*    AMPICILLIN <=2 SENSITIVE Sensitive     NITROFURANTOIN <=16 SENSITIVE Sensitive     VANCOMYCIN 1 SENSITIVE Sensitive     * 80,000 COLONIES/mL ENTEROCOCCUS FAECALIS         Radiology Studies: No results found.      Scheduled Meds: . acetaZOLAMIDE  250 mg Oral BID  . apixaban  5 mg Oral BID  . bacitracin   Topical BID  . Chlorhexidine Gluconate Cloth  6 each Topical Daily  . digoxin  0.25 mg Oral Daily  . folic acid  1 mg Oral Daily  . gabapentin  600 mg Oral QPM  . losartan  25 mg Oral Daily  . mouth rinse  15 mL Mouth Rinse BID  . metoprolol succinate  25 mg Oral Daily  . multivitamin with minerals  1 tablet Oral Daily  . potassium chloride  40 mEq Oral BID  . sodium chloride flush  10-40 mL Intracatheter Q12H  .  spironolactone  25  mg Oral Daily  . testosterone cypionate  200 mg Intramuscular Q14 Days  . thiamine  100 mg Oral Daily   Or  . thiamine  100 mg Intravenous Daily  . vitamin B-12  1,000 mcg Oral Daily  . Zinc Oxide   Topical TID   Continuous Infusions: . sodium chloride 500 mL (04/10/21 2133)  . sodium chloride    . amiodarone 30 mg/hr (04/13/21 1259)  . furosemide 120 mg (04/13/21 1101)     LOS: 20 days    Time spent: 37 minutes spent on chart review, discussion with nursing staff, consultants, updating family and interview/physical exam; more than 50% of that time was spent in counseling and/or coordination of care.    Nelson Julson J [place] DO [facility] Available via Epic secure chat 7am-7pm After these hours, please refer to coverage provider listed on [email or web address] 04/13/2021, 1:08 PM

## 2021-04-13 NOTE — Progress Notes (Signed)
   04/13/21 2055  Assess: MEWS Score  Temp 98 F (36.7 C)  BP (!) 95/59  Pulse Rate (!) 117  ECG Heart Rate (!) 110  Resp 15  Level of Consciousness Alert  Assess: MEWS Score  MEWS Temp 0  MEWS Systolic 1  MEWS Pulse 1  MEWS RR 0  MEWS LOC 0  MEWS Score 2  MEWS Score Color Yellow  Assess: if the MEWS score is Yellow or Red  Were vital signs taken at a resting state? Yes  Focused Assessment No change from prior assessment  Early Detection of Sepsis Score *See Row Information* Low  MEWS guidelines implemented *See Row Information* No, previously yellow, continue vital signs every 4 hours  Assess: SIRS CRITERIA  SIRS Temperature  0  SIRS Pulse 1  SIRS Respirations  0  SIRS WBC 0  SIRS Score Sum  1

## 2021-04-13 NOTE — Consult Note (Addendum)
[place] Nurse Consult Note: Patient receiving care in [place]. Patient requires 3 persons to turn in bed. Reason for Consult: DTPI to sacrum Wound type: When asked, the patient tells me he has been sitting in his recliner 24 hours a day for 2 months prior to coming to the hospital. There is purple discoloration to the buttocks consistent with Chronic Tissue Injury (CTI). There is also a darker purple area on the left buttock that measures 4 cm x 7 cm.  It is difficult at this stage to say whether or not this was the same when he was admitted 19 days ago. Pressure Injury POA: Yes/No/NA Measurement: 4 cm x 7 cm  Wound bed: purple Drainage (amount, consistency, odor) none Periwound: consistent with CTI Dressing procedure/placement/frequency: A sacral foam dressing was in place; continue the use of this approach. I have added an order for a bariatric bed with an air mattress.  The patient has Prevalon heel lift boots, and the staff need to use these. I have also added an order for the staff to turn the patient every 2 hours. There is a MASD to the intergluteal cleft for which I have order TID Triple paste.  Monitor the wound area(s) for worsening of condition such as: Signs/symptoms of infection,  Increase in size,  Development of or worsening of odor, Development of pain, or increased pain at the affected locations.  Notify the medical team if any of these develop.  Thank you for the consult.  Discussed plan of care with the patient and bedside nurse.  [place] nurse will not follow at this time.  Please re-consult the [facility] team if needed.  Val Riles, RN, MSN, CWOCN, CNS-BC, pager [phone]

## 2021-04-13 NOTE — Care Management Important Message (Signed)
Important Message  Patient Details  Name: TERRAL COOKS MRN: [id number] Date of Birth: 10/02/1946   Medicare Important Message Given:  Yes     Shelda Altes 04/13/2021, 9:41 AM

## 2021-04-14 DIAGNOSIS — J810 Acute pulmonary edema: Secondary | ICD-10-CM | POA: Diagnosis not present

## 2021-04-14 DIAGNOSIS — J9601 Acute respiratory failure with hypoxia: Secondary | ICD-10-CM | POA: Diagnosis not present

## 2021-04-14 DIAGNOSIS — I4891 Unspecified atrial fibrillation: Secondary | ICD-10-CM | POA: Diagnosis not present

## 2021-04-14 DIAGNOSIS — I5021 Acute systolic (congestive) heart failure: Secondary | ICD-10-CM | POA: Diagnosis not present

## 2021-04-14 LAB — BASIC METABOLIC PANEL
Anion gap: 7 (ref 5–15)
BUN: 28 mg/dL — ABNORMAL HIGH (ref 8–23)
CO2: 36 mmol/L — ABNORMAL HIGH (ref 22–32)
Calcium: 8.1 mg/dL — ABNORMAL LOW (ref 8.9–10.3)
Chloride: 90 mmol/L — ABNORMAL LOW (ref 98–111)
Creatinine, Ser: 1.03 mg/dL (ref 0.61–1.24)
GFR, Estimated: >60 mL/min (ref >60)
Glucose, Bld: 111 mg/dL — ABNORMAL HIGH (ref 70–99)
Potassium: 4.3 mmol/L (ref 3.5–5.1)
Sodium: 133 mmol/L — ABNORMAL LOW (ref 135–145)

## 2021-04-14 LAB — CBC
HCT: 49.6 % (ref 39.0–52.0)
Hemoglobin: 16 g/dL (ref 13.0–17.0)
MCH: 32.7 pg (ref 26.0–34.0)
MCHC: 32.3 g/dL (ref 30.0–36.0)
MCV: 101.4 fL — ABNORMAL HIGH (ref 80.0–100.0)
Platelets: 289 10*3/uL (ref 150–400)
RBC: 4.89 MIL/uL (ref 4.22–5.81)
RDW: 13.2 % (ref 11.5–15.5)
WBC: 8.1 10*3/uL (ref 4.0–10.5)
nRBC: 0 % (ref 0.0–0.2)

## 2021-04-14 LAB — MAGNESIUM: Magnesium: 2.3 mg/dL (ref 1.7–2.4)

## 2021-04-14 NOTE — Progress Notes (Signed)
PROGRESS NOTE    Jose Fitzpatrick  SNK:[id number] DOB: 17-Aug-1946 DOA: 03/21/2021 PCP: Tamsen Roers, MD    No chief complaint on file.   Brief Narrative: 75 year old gentleman prior history of hypertension, morbid obesity, alcohol abuse, chronic venous insufficiency admitted for acute decompensated heart failure was also found to be in atrial flutter/atrial fibrillation with RVR patient underwent TEE guided cardioversion on Mar 30, 2021, had repeat cardioversion May 23 but his atrial fibrillation reoccurred.   Assessment & Plan:   Principal Problem:   Atrial fibrillation with RVR (HCC) Active Problems:   HTN (hypertension)   Acute CHF (congestive heart failure) (HCC)   Acute respiratory failure with hypoxia (HCC)   Elevated TSH   BMI 50.0-59.9, adult (HCC)   Hypercapnic respiratory failure (HCC)   Pressure injury of skin    Atrial fibrillation with RVR Underwent DCCV cardioversion on May 16 and 23 with recurrence Continue with Eliquis for anticoagulation and gtt., dig and metoprolol 25 mg daily   Acute systolic congestive heart failure Diuresis with IV Lasix, Diamox. Continue strict intake and output and daily weights Nasal cannula oxygen to keep sats greater than 90%.    AKI probably secondary to volume overload Creatinine at 1   . Essential hypertension Blood pressure parameters appear to be optimal    Subclinical hypothyroidism     EtOH use disorder Counseled on cessation TOC for resources   Body mass index is 43.37 kg/m. Morbid obesity. Recommend outpatient follow-up with PCP and may benefit from bariatric evaluation as outpatient.   Pressure injury present on admission. Pressure Injury 04/05/21 Ear Left Stage 2 -  Partial thickness loss of dermis presenting as a shallow open injury with a red, pink wound bed without slough. from oxygen tubing (Active)  04/05/21 1312  Location: Ear  Location Orientation: Left  Staging: Stage 2 -  Partial  thickness loss of dermis presenting as a shallow open injury with a red, pink wound bed without slough.  Wound Description (Comments): from oxygen tubing  Present on Admission: Yes     Pressure Injury 04/05/21 Ear Right Stage 2 -  Partial thickness loss of dermis presenting as a shallow open injury with a red, pink wound bed without slough. from oxygen tubing (Active)  04/05/21 1317  Location: Ear  Location Orientation: Right  Staging: Stage 2 -  Partial thickness loss of dermis presenting as a shallow open injury with a red, pink wound bed without slough.  Wound Description (Comments): from oxygen tubing  Present on Admission: Yes     Pressure Injury 04/13/21 Sacrum Right;Left;Mid Deep Tissue Pressure Injury - Purple or maroon localized area of discolored intact skin or blood-filled blister due to damage of underlying soft tissue from pressure and/or shear. (Active)  04/13/21 0400  Location: Sacrum  Location Orientation: Right;Left;Mid  Staging: Deep Tissue Pressure Injury - Purple or maroon localized area of discolored intact skin or blood-filled blister due to damage of underlying soft tissue from pressure and/or shear.  Wound Description (Comments):   Present on Admission:    Wound care consult ordered, continue with local wound care.    Deconditioning and debility Therapy evaluations recommending SNF       DVT prophylaxis: Eliquis  code Status: DNR  family Communication: None at bedside Disposition:   Status is: Inpatient  Remains inpatient appropriate because:Ongoing diagnostic testing needed not appropriate for outpatient work up, Unsafe d/c plan and IV treatments appropriate due to intensity of illness or inability to take PO  Dispo: The patient is from: Home              Anticipated d/c is to: SNF              Patient currently is not medically stable to d/c.   Difficult to place patient No       Consultants:   Cardiology  Procedures:    Antimicrobials: None   Subjective: No new complaints  Objective: Vitals:   04/13/21 2055 04/13/21 2354 04/14/21 0454 04/14/21 0735  BP: (!) 95/59 114/77 108/87 110/74  Pulse: (!) 117 (!) 102 (!) 118 (!) 103  Resp: 15 16 18 18   Temp: 98 F (36.7 C) 98.2 F (36.8 C) 98.4 F (36.9 C) 97.6 F (36.4 C)  TempSrc:  Oral Oral Oral  SpO2:  97% 95% 94%  Weight:   (!) 157.4 kg   Height:        Intake/Output Summary (Last 24 hours) at 04/14/2021 1701 Last data filed at 04/14/2021 1559 Gross per 24 hour  Intake 837.51 ml  Output 800 ml  Net 37.51 ml   Filed Weights   04/12/21 0337 04/13/21 0347 04/14/21 0454  Weight: (!) 169.6 kg (!) 168.4 kg (!) 157.4 kg    Examination:  General exam: Appears calm and comfortable  Respiratory system: Clear to auscultation. Respiratory effort normal. Cardiovascular system: S1 & S2 heard, RRR. No JVD, 3+ leg edema Gastrointestinal system: Abdomen is nondistended, soft and nontender.  Normal bowel sounds heard. Central nervous system: Alert and oriented. No focal neurological deficits. Extremities: Symmetric 5 x 5 power. Skin: No rashes, lesions or ulcers Psychiatry: Mood & affect appropriate.     Data Reviewed: I have personally reviewed following labs and imaging studies  CBC: Recent Labs  Lab 04/14/21 0516  WBC 8.1  HGB 16.0  HCT 49.6  MCV 101.4*  PLT 956    Basic Metabolic Panel: Recent Labs  Lab 04/09/21 0410 04/10/21 0434 04/11/21 0542 04/12/21 0459 04/13/21 0403 04/14/21 0516  NA 133* 132* 135 135 136 133*  K 3.8 3.4* 3.5 3.6 4.0 4.3  CL 84* 84* 88* 90* 90* 90*  CO2 43* 42* 39* 37* 38* 36*  GLUCOSE 108* 114* 101* 124* 119* 111*  BUN 26* 24* 25* 25* 25* 28*  CREATININE 1.24 1.16 1.10 1.24 1.08 1.03  CALCIUM 8.0* 8.1* 8.0* 8.2* 8.5* 8.1*  MG 2.1 2.1 2.1  --  2.5* 2.3    GFR: Estimated Creatinine Clearance: 99.7 mL/min (by C-G formula based on SCr of 1.03 mg/dL).  Liver Function Tests: Recent Labs  Lab  04/09/21 0410  AST 27  ALT 25  ALKPHOS 48  BILITOT 1.3*  PROT 5.7*  ALBUMIN 2.2*    CBG: Recent Labs  Lab 04/10/21 2129 04/12/21 0745  GLUCAP 121* 107*     Recent Results (from the past 240 hour(s))  Culture, Urine     Status: Abnormal   Collection Time: 04/04/21  6:43 PM   Specimen: Urine, Clean Catch  Result Value Ref Range Status   Specimen Description   Final    URINE, CLEAN CATCH Performed at [facility], [place] [address] [place], [place] [address]    Special Requests   Final    NONE Performed at [facility], [place] [address] [place], [place] [address]    Culture (A)  Final    >=100,000 COLONIES/mL ESCHERICHIA COLI 80,000 COLONIES/mL ENTEROCOCCUS FAECALIS Confirmed Extended Spectrum Beta-Lactamase Producer (ESBL).  In bloodstream infections from  ESBL organisms, carbapenems are preferred over piperacillin/tazobactam. They are shown to have a lower risk of mortality.    Report Status 04/07/2021 FINAL  Final   Organism ID, Bacteria ESCHERICHIA COLI (A)  Final   Organism ID, Bacteria ENTEROCOCCUS FAECALIS (A)  Final      Susceptibility   Escherichia coli - MIC*    AMPICILLIN >=32 RESISTANT Resistant     CEFAZOLIN >=64 RESISTANT Resistant     CEFEPIME 8 INTERMEDIATE Intermediate     CEFTRIAXONE >=64 RESISTANT Resistant     CIPROFLOXACIN <=0.25 SENSITIVE Sensitive     GENTAMICIN <=1 SENSITIVE Sensitive     IMIPENEM <=0.25 SENSITIVE Sensitive     NITROFURANTOIN <=16 SENSITIVE Sensitive     TRIMETH/SULFA <=20 SENSITIVE Sensitive     AMPICILLIN/SULBACTAM >=32 RESISTANT Resistant     PIP/TAZO <=4 SENSITIVE Sensitive     * >=100,000 COLONIES/mL ESCHERICHIA COLI   Enterococcus faecalis - MIC*    AMPICILLIN <=2 SENSITIVE Sensitive     NITROFURANTOIN <=16 SENSITIVE Sensitive     VANCOMYCIN 1 SENSITIVE Sensitive     * 80,000 COLONIES/mL ENTEROCOCCUS FAECALIS         Radiology Studies: No results found.      Scheduled  Meds: . acetaZOLAMIDE  250 mg Oral BID  . apixaban  5 mg Oral BID  . bacitracin   Topical BID  . Chlorhexidine Gluconate Cloth  6 each Topical Daily  . digoxin  0.25 mg Oral Daily  . folic acid  1 mg Oral Daily  . gabapentin  600 mg Oral QPM  . losartan  25 mg Oral Daily  . mouth rinse  15 mL Mouth Rinse BID  . metoprolol succinate  25 mg Oral Daily  . multivitamin with minerals  1 tablet Oral Daily  . potassium chloride  40 mEq Oral BID  . sodium chloride flush  10-40 mL Intracatheter Q12H  . spironolactone  25 mg Oral Daily  . testosterone cypionate  200 mg Intramuscular Q14 Days  . thiamine  100 mg Oral Daily   Or  . thiamine  100 mg Intravenous Daily  . vitamin B-12  1,000 mcg Oral Daily  . Zinc Oxide   Topical TID   Continuous Infusions: . sodium chloride 500 mL (04/10/21 2133)  . sodium chloride    . amiodarone 30 mg/hr (04/14/21 1328)  . furosemide Stopped (04/14/21 0920)     LOS: 21 days        Hosie Poisson, MD [facility]   To contact the attending provider between 7A-7P or the covering provider during after hours 7P-7A, please log into the web site [email or web address] and access using universal [place] password for that web site. If you do not have the password, please call the hospital operator.  04/14/2021, 5:01 PM

## 2021-04-14 NOTE — Progress Notes (Signed)
Progress Note  Patient Name: Jose Fitzpatrick Date of Encounter: 04/14/2021  [facility] Cardiologist: Kirk Ruths, MD   Subjective   No CP or dyspnea  Inpatient Medications    Scheduled Meds: . acetaZOLAMIDE  250 mg Oral BID  . apixaban  5 mg Oral BID  . bacitracin   Topical BID  . Chlorhexidine Gluconate Cloth  6 each Topical Daily  . digoxin  0.25 mg Oral Daily  . folic acid  1 mg Oral Daily  . gabapentin  600 mg Oral QPM  . losartan  25 mg Oral Daily  . mouth rinse  15 mL Mouth Rinse BID  . metoprolol succinate  25 mg Oral Daily  . multivitamin with minerals  1 tablet Oral Daily  . potassium chloride  40 mEq Oral BID  . sodium chloride flush  10-40 mL Intracatheter Q12H  . spironolactone  25 mg Oral Daily  . testosterone cypionate  200 mg Intramuscular Q14 Days  . thiamine  100 mg Oral Daily   Or  . thiamine  100 mg Intravenous Daily  . vitamin B-12  1,000 mcg Oral Daily  . Zinc Oxide   Topical TID   Continuous Infusions: . sodium chloride 500 mL (04/10/21 2133)  . sodium chloride    . amiodarone 30 mg/hr (04/14/21 0032)  . furosemide 120 mg (04/13/21 1816)   PRN Meds: sodium chloride, Place/Maintain arterial line **AND** sodium chloride, acetaminophen, docusate sodium, polyethylene glycol, sodium chloride flush   Vital Signs    Vitals:   04/13/21 2020 04/13/21 2055 04/13/21 2354 04/14/21 0454  BP: (!) 97/56 (!) 95/59 114/77 108/87  Pulse: (!) 120 (!) 117 (!) 102 (!) 118  Resp: 15 15 16 18   Temp: 97.9 F (36.6 C) 98 F (36.7 C) 98.2 F (36.8 C) 98.4 F (36.9 C)  TempSrc: Oral  Oral Oral  SpO2: 94%  97% 95%  Weight:    (!) 157.4 kg  Height:       No intake or output data in the 24 hours ending 04/14/21 0714 Last 3 Weights 04/14/2021 04/13/2021 04/12/2021  Weight (lbs) 347 lb 371 lb 4.1 oz 373 lb 14.4 oz  Weight (kg) 157.398 kg 168.4 kg 169.6 kg      Telemetry    Atrial fibrillation; rate elevated- Personally Reviewed   Physical Exam    GEN: NAD.  WD Obese Neck: supple Cardiac: irregular and tachycardic; distant heart sounds Respiratory: CTA anteriorly; no wheeze GI: Soft, obese, no masses; presacral edema MS: chronic skin changes; 2+ edema Neuro:  no focal findings Psych: Normal affect   Labs    High Sensitivity Troponin:   Recent Labs  Lab 04/12/2021 2023 04/11/2021 2147  TROPONINIHS 12 12      Chemistry Recent Labs  Lab 04/09/21 0410 04/10/21 0434 04/12/21 0459 04/13/21 0403 04/14/21 0516  NA 133*   < > 135 136 133*  K 3.8   < > 3.6 4.0 4.3  CL 84*   < > 90* 90* 90*  CO2 43*   < > 37* 38* 36*  GLUCOSE 108*   < > 124* 119* 111*  BUN 26*   < > 25* 25* 28*  CREATININE 1.24   < > 1.24 1.08 1.03  CALCIUM 8.0*   < > 8.2* 8.5* 8.1*  PROT 5.7*  --   --   --   --   ALBUMIN 2.2*  --   --   --   --   AST  27  --   --   --   --   ALT 25  --   --   --   --   ALKPHOS 48  --   --   --   --   BILITOT 1.3*  --   --   --   --   GFRNONAA >60   < > >60 >60 >60  ANIONGAP 6   < > 8 8 7    < > = values in this interval not displayed.     Hematology Recent Labs  Lab 04/14/21 0516  WBC 8.1  RBC 4.89  HGB 16.0  HCT 49.6  MCV 101.4*  MCH 32.7  MCHC 32.3  RDW 13.2  PLT 289     Patient Profile     Jose Rafferty Carpenteris a 75 y.o.malewith hypertension, morbid obesity, chronic venous insufficiency, possible alcohol abuse who was admitted on 03/17/2021 with acute decompensated systolic heart failure. Found to be in atrial flutter as well as afib with RVR.Patient had TEE guided cardioversion on May 16 (ejection fraction 25 to 30%, moderate RV dysfunction, moderate left atrial enlargement, moderate right atrial enlargement, mild mitral regurgitation).  He did not hold sinus rhythm.  Had repeat cardioversion May 23 but atrial fibrillation again recurred.  Assessment & Plan    1 acute systolic congestive heart failure-Wt 157.4 kg.  Patient remains volume overloaded but slowly improving.  We will continue Lasix and  spironolactone at present dose.  Follow renal function.    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  There may also be a contribution from alcohol.  We will continue low-dose ARB as blood pressure is borderline.  Continue Toprol both for cardiomyopathy and to help with rate control.  Will need follow-up echocardiogram 3 months after heart rate controlled to see if LV function improved.  If not would need ischemia evaluation at that time.    3 atrial fibrillation/flutter-patient remains in atrial fibrillation and heart rate elevated.  Sinus was reestablished previously on 2 separate occasions with cardioversion but atrial fibrillation recurred.  We will continue amiodarone, beta-blocker and digoxin (will need to follow digoxin level particularly given amiodarone interaction).  Hopefully as more amiodarone is in his system and CHF improves he will hold sinus rhythm.  We will likely plan reattempt at cardioversion late this week.    4 hypertension-blood pressure has improved.   5 obesity-he will need an outpatient sleep study and also needs weight loss.  For questions or updates, please contact [place] Please consult [email or web address] for contact info under        Signed, Kirk Ruths, MD  04/14/2021, 7:14 AM

## 2021-04-14 NOTE — TOC Progression Note (Signed)
Transition of Care [facility]) - Progression Note    Patient Details  Name: Jose Fitzpatrick MRN: [id number] Date of Birth: 07-May-1946  [facility] CM/SW [place], [place] Phone Number: 04/14/2021, 2:57 PM  Clinical Narrative:     Patient has SNF bed at [facility]. CSW will start insurance authorization close to patient being medically ready. CSW will continue to follow and assist with discharge planning needs.   Expected Discharge Plan: Home/Self Care Barriers to Discharge: Continued Medical Work up  Expected Discharge Plan and Services Expected Discharge Plan: Home/Self Care   Discharge Planning Services: CM Consult   Living arrangements for the past 2 months: Single Family Home                                       Social Determinants of Health (SDOH) Interventions    Readmission Risk Interventions No flowsheet data found.

## 2021-04-14 NOTE — Progress Notes (Addendum)
Occupational Therapy Treatment Patient Details Name: Jose Fitzpatrick MRN: [id number] DOB: 1946/06/07 Today's Date: 04/14/2021    History of present illness Pt is a 75 y.o. male admitted to [facility] on 03/29/2021 with BLE swelling, DOE, weaight gain. Workup for CHF exacerbation, aflutter/afib with RVR. S/p TEE guided DCCV 5/16 but quickly returned to afib. Pt with hypotension and transfer to [facility] on 5/21. S/p repeat DCCV 5/23. Converted back to aflutter 5/24. 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   OT comments  On entry, pt requesting assistance off of bedpan. Pt overall Max A to roll to R side, Max A x 2 to roll to L side and Total A for peri care. Pt with flushed face during exertion but HR/O2 stable on 4 L O2. Coordinated with RN and PT for transfer plan to tilt bed to facilitate tolerance for standing activities. Pt with slow progress towards OT goals, downgraded goals accordingly. Will follow-up in next OT session for tilt bed activities. Continue to recommend SNF at DC.    Follow Up Recommendations  SNF    Equipment Recommendations  None recommended by OT    Recommendations for Other Services      Precautions / Restrictions Precautions Precautions: Fall;Other (comment) Precaution Comments: Watch HR (afib/aflutter in 120s at rest); bowel/bladder incontinence Restrictions Weight Bearing Restrictions: No       Mobility Bed Mobility Overal bed mobility: Needs Assistance Bed Mobility: Rolling Rolling: Max assist;+2 for physical assistance         General bed mobility comments: Max A x 1 to roll to R side, Max A x 2 to roll to L side for peri care    Transfers                      Balance                                           ADL either performed or assessed with clinical judgement   ADL Overall ADL's : Needs assistance/impaired                             Toileting- Clothing Manipulation and Hygiene: Total  assistance;Bed level Toileting - Clothing Manipulation Details (indicate cue type and reason): requesting assist off of bed pan on entry, Total A for cleanup       General ADL Comments: Planning to transition to tilt bed this session, awaiting staff training/assist. Request for toileting assist on entry     [place]?: No apparent visual deficits   Perception     Praxis      Cognition Arousal/Alertness: Awake/alert Behavior During Therapy: Flat affect Overall Cognitive Status: Within Functional Limits for tasks assessed                                 General Comments: WFL for tasks; flat affect with minimal conversation during session        Exercises     Shoulder Instructions       General Comments SpO2 with variable pleth but reads >90% on 4 L O2. HR stable with rolling though pt flushed with exertion    Pertinent Vitals/ Pain       Pain Assessment: Faces Faces Pain Scale: Hurts  a little bit Pain Location: legs when rolling in bed Pain Descriptors / Indicators: Sore Pain Intervention(s): Monitored during session;Limited activity within patient's tolerance  Home Living                                          Prior Functioning/Environment              Frequency  Min 2X/week        Progress Toward Goals  OT Goals(current goals can now be found in the care plan section)  Progress towards OT goals: Goals drowngraded-see care plan  Acute Rehab OT Goals Patient Stated Goal: "I want to go home" OT Goal Formulation: With patient Time For Goal Achievement: 04/28/21 Potential to Achieve Goals: Good ADL Goals Pt Will Perform Lower Body Dressing: with mod assist;with adaptive equipment;sit to/from stand;sitting/lateral leans Pt Will Transfer to Toilet: with max assist;stand pivot transfer;bedside commode Pt Will Perform Toileting - Clothing Manipulation and hygiene: with mod assist;sitting/lateral leans;sit  to/from stand  Plan Discharge plan remains appropriate;Frequency remains appropriate    Co-evaluation                 AM-PAC OT "6 Clicks" Daily Activity     Outcome Measure   Help from another person eating meals?: None Help from another person taking care of personal grooming?: A Little Help from another person toileting, which includes using toliet, bedpan, or urinal?: Total Help from another person bathing (including washing, rinsing, drying)?: A Lot Help from another person to put on and taking off regular upper body clothing?: A Little Help from another person to put on and taking off regular lower body clothing?: Total 6 Click Score: 14    End of Session Equipment Utilized During Treatment: Oxygen  OT Visit Diagnosis: History of falling (Z91.81);Muscle weakness (generalized) (M62.81)   Activity Tolerance Patient tolerated treatment well;No increased pain   Patient Left in bed;with call bell/phone within reach   Nurse Communication Mobility status;Need for lift equipment;Other (comment) (preparing for tilt bed transfer)        Time: 9191-6606 OT Time Calculation (min): 14 min  Charges: OT General Charges $OT Visit: 1 Visit OT Treatments $Self Care/Home Management : 8-22 mins  Malachy Chamber, OTR/L Acute Rehab Services Office: [phone]   Layla Maw 04/14/2021, 1:58 PM

## 2021-04-14 NOTE — Plan of Care (Signed)

## 2021-04-14 NOTE — Progress Notes (Signed)
   04/14/21 0454  Assess: MEWS Score  Temp 98.4 F (36.9 C)  BP 108/87  Pulse Rate (!) 118  ECG Heart Rate (!) 118  Resp 18  SpO2 95 %  O2 Device Nasal Cannula  O2 Flow Rate (L/min) 3.5 L/min  Assess: MEWS Score  MEWS Temp 0  MEWS Systolic 0  MEWS Pulse 2  MEWS RR 0  MEWS LOC 0  MEWS Score 2  MEWS Score Color Yellow  Assess: if the MEWS score is Yellow or Red  Were vital signs taken at a resting state? Yes  Focused Assessment No change from prior assessment  Early Detection of Sepsis Score *See Row Information* Low  MEWS guidelines implemented *See Row Information* No, previously yellow, continue vital signs every 4 hours  Assess: SIRS CRITERIA  SIRS Temperature  0  SIRS Pulse 1  SIRS Respirations  0  SIRS WBC 0  SIRS Score Sum  1

## 2021-04-14 NOTE — Plan of Care (Signed)
  Problem: Clinical Measurements: Goal: Ability to maintain clinical measurements within normal limits will improve Outcome: Progressing   

## 2021-04-14 NOTE — Progress Notes (Addendum)
Heart Failure Stewardship Pharmacist Progress Note   PCP: Tamsen Roers, MD PCP-Cardiologist: Kirk Ruths, MD    HPI:  75 YO male with PMH significant for HTN, morbid obesity, possible alcohol abuse. Admitted 04/01/2021 with shortness of breath and acute decompensated systolic HF; also in atrial flutter and afib with RVR. Underwent TEE guided [facility], however afib returned. Repeat was successful and converted back to AFL 5/24. Newly diagnosed CHF with EF of 25-30%.   Current HF Medications: Diamox 250 mg BID Furosemide 120 mg IV BID Metoprolol XL 25 mg daily Losartan 25 mg daily Spironolactone 25 mg daily Digoxin 0.25 mg daily   Prior to admission HF Medications: Metoprolol succinate 50 mg daily  Lisinopril-HCTZ 20-25 mg    Pertinent Lab Values: . Serum creatinine 1.03, BUN 28, Potassium 4.3, Sodium 133, BNP 77.5, Magnesium 2.3, Digoxin 0.3 (5/22), Digoxin <0.2 (5/27)  Vital Signs: . Weight: 347 (was 371 yesterday - likely erroneous entry) lbs (admission weight: 400 lbs) . Blood pressure: 110-130/70s . Heart rate: 120s    Medication Assistance / Insurance Benefits Check: Does the patient have prescription insurance?  Yes Type of insurance plan: [employer] Medicare  Does the patient qualify for medication assistance through manufacturers or grants?   Pending   Outpatient Pharmacy:  Prior to admission [facility] Is the patient willing to use [facility] pharmacy at discharge? Pending Is the patient willing to transition their outpatient pharmacy to utilize a [facility] outpatient pharmacy?   Pending    Assessment: 1. Acute on chronic systolic CHF (EF 09-32%). NYHA class II/III symptoms. - Continue diamox 250 mg BID. CO2 down from 40s>36. - Continue furosemide 120 mg IV BID. Weight down to 370s, output 755mL recorded yesterday - Continue metoprolol XL 25 mg daily - Continue losartan 25 mg daily - consider optimizing to [facility] prior to discharge. Please note that he  was taking lisinopril-hctz PTA - Continue spironolactone 25 mg daily - Holding off on SGLT2i for now - UA positive for E coli, may exacerbate. Consider starting in future if found appropriate. - Continue digoxin 0.25 daily - level <0.2 on 5/27.   Plan: 1) Medication changes recommended at this time: - Continue current regimen   2) Patient assistance: - Jardiance Copay: $45/month - Farxiga Copay: $95/month - Unable to complete copay test for Entresto due to recent fill of lisinopril  - Dispo: possible CIR vs SNF  3)  Education  - To be completed prior to discharge  Kerby Nora, PharmD, BCPS Heart Failure Stewardship Pharmacist Phone [phone]

## 2021-04-15 DIAGNOSIS — I4891 Unspecified atrial fibrillation: Secondary | ICD-10-CM | POA: Diagnosis not present

## 2021-04-15 DIAGNOSIS — I5021 Acute systolic (congestive) heart failure: Secondary | ICD-10-CM | POA: Diagnosis not present

## 2021-04-15 LAB — DIGOXIN LEVEL: Digoxin Level: 0.8 ng/mL (ref 0.8–2.0)

## 2021-04-15 MED ORDER — FUROSEMIDE 10 MG/ML IJ SOLN
120.0000 mg | Freq: Three times a day (TID) | INTRAVENOUS | Status: DC
  Administered 2021-04-15 (×2): 120 mg via INTRAVENOUS
  Filled 2021-04-15 (×2): qty 12
  Filled 2021-04-15: qty 10
  Filled 2021-04-15: qty 12
  Filled 2021-04-15: qty 10

## 2021-04-15 NOTE — Progress Notes (Signed)
Heart Failure Stewardship Pharmacist Progress Note   PCP: Tamsen Roers, MD PCP-Cardiologist: Kirk Ruths, MD    HPI:  75 YO male with PMH significant for HTN, morbid obesity, possible alcohol abuse. Admitted 03/16/2021 with shortness of breath and acute decompensated systolic HF; also in atrial flutter and afib with RVR. Underwent TEE guided [facility] on 5/16, however afib returned. Repeated cardioversion on 5/23 but he remains in afib with elevated heart rate. Planning repeat cardioversion on Friday this week. Newly diagnosed CHF with EF of 25-30%.   Current HF Medications: Diamox 250 mg BID Furosemide 120 mg IV TID Metoprolol XL 25 mg daily Losartan 25 mg daily Spironolactone 25 mg daily Digoxin 0.25 mg daily   Prior to admission HF Medications: Metoprolol succinate 50 mg daily  Lisinopril-HCTZ 20-25 mg   Pertinent Lab Values: . As of 5/31: Serum creatinine 1.03, BUN 28, Potassium 4.3, Sodium 133, BNP 77.5, Magnesium 2.3, Digoxin 0.3 (5/22), Digoxin <0.2 (5/27)  Vital Signs: . Weight: 381 lbs (admission weight: 400 lbs) . Blood pressure: 110-130/70s . Heart rate: 110s    Medication Assistance / Insurance Benefits Check: Does the patient have prescription insurance?  Yes Type of insurance plan: [employer]  Outpatient Pharmacy:  Prior to admission [facility] Is the patient willing to use [place] at discharge? Pending Is the patient willing to transition their outpatient pharmacy to utilize a [facility] outpatient pharmacy?   Pending    Assessment: 1. Acute on chronic systolic CHF (EF 44-01%). NYHA class II/III symptoms. - Continue diamox 250 mg BID. CO2 down from 40s>36. - Agree with increasing furosemide to 120 mg IV TID. Output only 1.2L yesterday. Weight up to 381 lbs. 2+ edema on MD exam.  - Continue metoprolol XL 25 mg daily - Continue losartan 25 mg daily - consider optimizing to [facility] prior to discharge. Please note that he was taking  lisinopril-hctz PTA - Continue spironolactone 25 mg daily - Holding off on SGLT2i for now - UA positive for E coli, may exacerbate. Consider starting in future if found appropriate. - Continue digoxin 0.25 daily - level <0.2 on 5/27. Can repeat level on Friday with increased dose.   Plan: 1) Medication changes recommended at this time: - Agree with changes above  2) Patient assistance: - Jardiance Copay: $45/month - Wilder Glade Copay: $95/month - Unable to complete copay test for Entresto due to recent fill of lisinopril  - Dispo: possible CIR vs SNF  3)  Education  - To be completed prior to discharge  Kerby Nora, PharmD, BCPS Heart Failure Stewardship Pharmacist Phone [phone]

## 2021-04-15 NOTE — Progress Notes (Signed)
PROGRESS NOTE    Jose Fitzpatrick  ONG:[id number] DOB: [birth year] DOA: 03/19/2021 PCP: Tamsen Roers, MD    No chief complaint on file.   Brief Narrative: 75 year old gentleman prior history of hypertension, morbid obesity, alcohol abuse, chronic venous insufficiency admitted for acute decompensated heart failure was also found to be in atrial flutter/atrial fibrillation with RVR patient underwent TEE guided cardioversion on Mar 30, 2021, had repeat cardioversion May 23 but his atrial fibrillation reoccurred.   Assessment & Plan:   Principal Problem:   Atrial fibrillation with RVR (HCC) Active Problems:   HTN (hypertension)   Acute CHF (congestive heart failure) (HCC)   Acute respiratory failure with hypoxia (HCC)   Elevated TSH   BMI 50.0-59.9, adult (HCC)   Hypercapnic respiratory failure (HCC)   Pressure injury of skin    Atrial fibrillation with RVR Underwent DCCV cardioversion on May 16 and 23 with recurrence Continue with Eliquis for anticoagulation and gtt., dig and metoprolol 25 mg daily. Pt remains in afib with rates in 110/min.plan for cardioversion later this week as per cardiology.    Acute systolic congestive heart failure Diuresis with IV Lasix, Diamox. Diuresed about 19 lit since admission.  Continue strict intake and output and daily weights Nasal cannula oxygen to keep sats greater than 90%.currntly on 2l it of [place] oxygen.     AKI probably secondary to volume overload Creatinine at 1 and stable.    . Essential hypertension Blood pressure parameters appear to be optimal    Subclinical hypothyroidism Recommend check ing thyroid panel in 4 weeks.     EtOH use disorder Counseled on cessation TOC for resources   Body mass index is 47.64 kg/m. Morbid obesity. Recommend outpatient follow-up with PCP and may benefit from bariatric evaluation as outpatient.   Pressure injury present on admission. Pressure Injury 04/05/21 Ear Left Stage 2 -   Partial thickness loss of dermis presenting as a shallow open injury with a red, pink wound bed without slough. from oxygen tubing (Active)  04/05/21 1312  Location: Ear  Location Orientation: Left  Staging: Stage 2 -  Partial thickness loss of dermis presenting as a shallow open injury with a red, pink wound bed without slough.  Wound Description (Comments): from oxygen tubing  Present on Admission: Yes     Pressure Injury 04/05/21 Ear Right Stage 2 -  Partial thickness loss of dermis presenting as a shallow open injury with a red, pink wound bed without slough. from oxygen tubing (Active)  04/05/21 1317  Location: Ear  Location Orientation: Right  Staging: Stage 2 -  Partial thickness loss of dermis presenting as a shallow open injury with a red, pink wound bed without slough.  Wound Description (Comments): from oxygen tubing  Present on Admission: Yes     Pressure Injury 04/13/21 Sacrum Right;Left;Mid Deep Tissue Pressure Injury - Purple or maroon localized area of discolored intact skin or blood-filled blister due to damage of underlying soft tissue from pressure and/or shear. (Active)  04/13/21 0400  Location: Sacrum  Location Orientation: Right;Left;Mid  Staging: Deep Tissue Pressure Injury - Purple or maroon localized area of discolored intact skin or blood-filled blister due to damage of underlying soft tissue from pressure and/or shear.  Wound Description (Comments):   Present on Admission:    Wound care consult ordered, continue with local wound care.    Deconditioning and debility Therapy evaluations recommending SNF   Mild hyponatremia probably from diuresis Continue to monitor    DVT prophylaxis: Eliquis  code Status: DNR  family Communication: None at bedside Disposition:   Status is: Inpatient  Remains inpatient appropriate because:Ongoing diagnostic testing needed not appropriate for outpatient work up, Unsafe d/c plan and IV treatments appropriate due to  intensity of illness or inability to take PO   Dispo: The patient is from: Home              Anticipated d/c is to: SNF              Patient currently is not medically stable to d/c.   Difficult to place patient No       Consultants:   Cardiology  Procedures:   Antimicrobials: None   Subjective: Breathing better,   Objective: Vitals:   04/15/21 0028 04/15/21 0341 04/15/21 1028 04/15/21 1223  BP: (!) 141/89 128/77 (!) 112/93 101/84  Pulse: (!) 109   (!) 130  Resp: 15 14 15    Temp: 97.7 F (36.5 C) (!) 97.5 F (36.4 C) (!) 97.2 F (36.2 C)   TempSrc: Axillary Oral Oral   SpO2: 94% 95% 100% 99%  Weight:  (!) 172.9 kg    Height:        Intake/Output Summary (Last 24 hours) at 04/15/2021 1416 Last data filed at 04/15/2021 0800 Gross per 24 hour  Intake 961.5 ml  Output 400 ml  Net 561.5 ml   Filed Weights   04/13/21 0347 04/14/21 0454 04/15/21 0341  Weight: (!) 168.4 kg (!) 157.4 kg (!) 172.9 kg    Examination:  General exam: Alert and comfortable not in any kind of distress Respiratory system: Diminished air entry at bases on 2 L of nasal cannula oxygen, tachypnea present Cardiovascular system: S1-S2 heard, irregularly irregular, tachycardic, 3+ leg edema. Gastrointestinal system: Abdomen is nontender bowel sounds normal Central nervous system: Alert and answering all questions appropriately Extremities: No cyanosis Skin: Pressure injury on the back present Psychiatry: No dose appropriate   Data Reviewed: I have personally reviewed following labs and imaging studies  CBC: Recent Labs  Lab 04/14/21 0516  WBC 8.1  HGB 16.0  HCT 49.6  MCV 101.4*  PLT 408    Basic Metabolic Panel: Recent Labs  Lab 04/09/21 0410 04/10/21 0434 04/11/21 0542 04/12/21 0459 04/13/21 0403 04/14/21 0516  NA 133* 132* 135 135 136 133*  K 3.8 3.4* 3.5 3.6 4.0 4.3  CL 84* 84* 88* 90* 90* 90*  CO2 43* 42* 39* 37* 38* 36*  GLUCOSE 108* 114* 101* 124* 119* 111*  BUN  26* 24* 25* 25* 25* 28*  CREATININE 1.24 1.16 1.10 1.24 1.08 1.03  CALCIUM 8.0* 8.1* 8.0* 8.2* 8.5* 8.1*  MG 2.1 2.1 2.1  --  2.5* 2.3    GFR: Estimated Creatinine Clearance: 105.1 mL/min (by C-G formula based on SCr of 1.03 mg/dL).  Liver Function Tests: Recent Labs  Lab 04/09/21 0410  AST 27  ALT 25  ALKPHOS 48  BILITOT 1.3*  PROT 5.7*  ALBUMIN 2.2*    CBG: Recent Labs  Lab 04/10/21 2129 04/12/21 0745  GLUCAP 121* 107*     No results found for this or any previous visit (from the past 240 hour(s)).       Radiology Studies: No results found.      Scheduled Meds: . acetaZOLAMIDE  250 mg Oral BID  . apixaban  5 mg Oral BID  . bacitracin   Topical BID  . Chlorhexidine Gluconate Cloth  6 each Topical Daily  . digoxin  0.25 mg Oral  Daily  . folic acid  1 mg Oral Daily  . gabapentin  600 mg Oral QPM  . losartan  25 mg Oral Daily  . mouth rinse  15 mL Mouth Rinse BID  . metoprolol succinate  25 mg Oral Daily  . multivitamin with minerals  1 tablet Oral Daily  . potassium chloride  40 mEq Oral BID  . sodium chloride flush  10-40 mL Intracatheter Q12H  . spironolactone  25 mg Oral Daily  . testosterone cypionate  200 mg Intramuscular Q14 Days  . thiamine  100 mg Oral Daily   Or  . thiamine  100 mg Intravenous Daily  . vitamin B-12  1,000 mcg Oral Daily  . Zinc Oxide   Topical TID   Continuous Infusions: . sodium chloride 500 mL (04/10/21 2133)  . sodium chloride    . amiodarone 30 mg/hr (04/15/21 1227)  . furosemide       LOS: 22 days        Hosie Poisson, MD [facility]   To contact the attending provider between 7A-7P or the covering provider during after hours 7P-7A, please log into the web site [email or web address] and access using universal [place] password for that web site. If you do not have the password, please call the hospital operator.  04/15/2021, 2:16 PM

## 2021-04-15 NOTE — Progress Notes (Addendum)
Physical Therapy Treatment Patient Details Name: Jose Fitzpatrick MRN: [id number] DOB: 1946/03/23 Today's Date: 04/15/2021    History of Present Illness Pt is a 75 y.o. male admitted to [facility] on 03/29/2021 with BLE swelling, DOE, weaight gain. Workup for CHF exacerbation, aflutter/afib with RVR. S/p TEE guided DCCV 5/16 but quickly returned to afib. Pt with hypotension and transfer to [facility] on 5/21. S/p repeat DCCV 5/23. Converted back to aflutter 5/24. Plan for repeat cardioversion 6/3. 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   PT Comments    Pt now on Kreg tilt bed and tolerated first tilting session well this morning. Pt achieved 31' tilt, accepting 33kg weight onto BLEs through footboard; tolerated BUE/BLE therex during this partial standing as well; total time tilted was ~25-min. Pt continues to have flat affect, but seems appreciative of session. Pleased with how well pt tolerated tilting; RN notified and schedule posted in room. Will continue to follow acutely.  BP down to 87/66 with initial tilt, quick increase to 101/84 with sustained upright (pt asymptomatic) HR 100s-130 SpO2 99% on 2L O2 [place] (titrated down from 3L)    Follow Up Recommendations  SNF;Supervision for mobility/OOB     Equipment Recommendations   (defer)    Recommendations for Other Services       Precautions / Restrictions Precautions Precautions: Fall;Other (comment) Precaution Comments: Watch HR (afib/aflutter in 120s at rest); bowel/bladder incontinence Restrictions Weight Bearing Restrictions: No    Mobility  Bed Mobility                Tilt details: Start Time: 0955 Angle: 31 degrees Total Minutes in Angle: 25 minutes Patient Response: Flat affect;Cooperative  Transfers                    Ambulation/Gait                 Stairs             Wheelchair Mobility    Modified Rankin (Stroke Patients Only)       Balance                                             Cognition Arousal/Alertness: Awake/alert Behavior During Therapy: Flat affect Overall Cognitive Status: Within Functional Limits for tasks assessed                                 General Comments: WFL for tasks; flat affect with minimal conversation during session, but seems appreciative of first time tilting      Exercises Other Exercises Other Exercises: AROM bilateral shoulder flex/abd, cervical flex/ext/rotation/lateral flex Other Exercises: While tilted - hip internal rotation AAROM, TKE hold with assist to hold lower legs in IR, cervical flexion hold    General Comments General comments (skin integrity, edema, etc.): Performed pt's first tilt session in Kreg bed, pt tolerated very well. BP down to 87/66 with HR up to 130 (pt asymptomatic), BP increased to 101/84 with sustained tilt ankle; left in chair position with BP 112/93, HR 100s. Titrated down to 2L O2 [place] with SpO2 99%. Tilt schedule placed in room, RN instructed on frequency recommendations      Pertinent Vitals/Pain Pain Assessment: Faces Faces Pain Scale: Hurts a little bit Pain Location: Feet with prolonged WB during tilt  Pain Descriptors / Indicators: Sore;Tiring Pain Intervention(s): Monitored during session;Repositioned    Home Living                      Prior Function            PT Goals (current goals can now be found in the care plan section) Progress towards PT goals: Progressing toward goals    Frequency    Min 2X/week      PT Plan Current plan remains appropriate    Co-evaluation              AM-PAC PT "6 Clicks" Mobility   Outcome Measure  Help needed turning from your back to your side while in a flat bed without using bedrails?: A Lot Help needed moving from lying on your back to sitting on the side of a flat bed without using bedrails?: Total Help needed moving to and from a bed to a chair (including a wheelchair)?: Total Help  needed standing up from a chair using your arms (e.g., wheelchair or bedside chair)?: Total Help needed to walk in hospital room?: Total Help needed climbing 3-5 steps with a railing? : Total 6 Click Score: 7    End of Session Equipment Utilized During Treatment: Oxygen Activity Tolerance: Patient tolerated treatment well Patient left: in bed;with call bell/phone within reach Nurse Communication: Mobility status;Other (comment) (tilt schedule) PT Visit Diagnosis: Difficulty in walking, not elsewhere classified (R26.2);Muscle weakness (generalized) (M62.81);Other abnormalities of gait and mobility (R26.89)     Time: 7282-0601 PT Time Calculation (min) (ACUTE ONLY): 46 min  Charges:  $Therapeutic Exercise: 8-22 mins $Therapeutic Activity: 23-37 mins                     Mabeline Caras, PT, DPT Acute Rehabilitation Services  Pager [phone] Office [place] 04/15/2021, 12:33 PM

## 2021-04-15 NOTE — Plan of Care (Signed)

## 2021-04-15 NOTE — Progress Notes (Signed)
Progress Note  Patient Name: Jose Fitzpatrick Date of Encounter: 04/15/2021  [facility] Cardiologist: Kirk Ruths, MD   Subjective   Pt continues to deny CP or dyspnea  Inpatient Medications    Scheduled Meds: . acetaZOLAMIDE  250 mg Oral BID  . apixaban  5 mg Oral BID  . bacitracin   Topical BID  . Chlorhexidine Gluconate Cloth  6 each Topical Daily  . digoxin  0.25 mg Oral Daily  . folic acid  1 mg Oral Daily  . gabapentin  600 mg Oral QPM  . losartan  25 mg Oral Daily  . mouth rinse  15 mL Mouth Rinse BID  . metoprolol succinate  25 mg Oral Daily  . multivitamin with minerals  1 tablet Oral Daily  . potassium chloride  40 mEq Oral BID  . sodium chloride flush  10-40 mL Intracatheter Q12H  . spironolactone  25 mg Oral Daily  . testosterone cypionate  200 mg Intramuscular Q14 Days  . thiamine  100 mg Oral Daily   Or  . thiamine  100 mg Intravenous Daily  . vitamin B-12  1,000 mcg Oral Daily  . Zinc Oxide   Topical TID   Continuous Infusions: . sodium chloride 500 mL (04/10/21 2133)  . sodium chloride    . amiodarone 30 mg/hr (04/15/21 0612)  . furosemide 120 mg (04/15/21 0745)   PRN Meds: sodium chloride, Place/Maintain arterial line **AND** sodium chloride, acetaminophen, docusate sodium, polyethylene glycol, sodium chloride flush   Vital Signs    Vitals:   04/14/21 1800 04/14/21 2022 04/15/21 0028 04/15/21 0341  BP: (!) 136/118 133/82 (!) 141/89 128/77  Pulse:  98 (!) 109   Resp:  16 15 14   Temp: (!) 97.3 F (36.3 C) (!) 97.5 F (36.4 C) 97.7 F (36.5 C) (!) 97.5 F (36.4 C)  TempSrc: Oral Axillary Axillary Oral  SpO2:  92% 94% 95%  Weight:    (!) 172.9 kg  Height:        Intake/Output Summary (Last 24 hours) at 04/15/2021 0756 Last data filed at 04/15/2021 0612 Gross per 24 hour  Intake 1484.73 ml  Output 1200 ml  Net 284.73 ml   Last 3 Weights 04/15/2021 04/14/2021 04/13/2021  Weight (lbs) 381 lb 2.8 oz 347 lb 371 lb 4.1 oz  Weight (kg)  172.9 kg 157.398 kg 168.4 kg      Telemetry    Atrial fibrillation; rate elevated- Personally Reviewed   Physical Exam   GEN: NAD.  WD Obese Neck: supple, jugular venous distention difficult to assess. Cardiac: irregular and tachycardic Respiratory: CTA anteriorly; no rhonchi GI: Soft, obese, no masses; presacral edema MS: chronic skin changes; 2+ edema Neuro:   Grossly intact Psych: Normal affect   Labs    High Sensitivity Troponin:   Recent Labs  Lab 04/05/2021 2023 03/20/2021 2147  TROPONINIHS 12 12      Chemistry Recent Labs  Lab 04/09/21 0410 04/10/21 0434 04/12/21 0459 04/13/21 0403 04/14/21 0516  NA 133*   < > 135 136 133*  K 3.8   < > 3.6 4.0 4.3  CL 84*   < > 90* 90* 90*  CO2 43*   < > 37* 38* 36*  GLUCOSE 108*   < > 124* 119* 111*  BUN 26*   < > 25* 25* 28*  CREATININE 1.24   < > 1.24 1.08 1.03  CALCIUM 8.0*   < > 8.2* 8.5* 8.1*  PROT 5.7*  --   --   --   --  ALBUMIN 2.2*  --   --   --   --   AST 27  --   --   --   --   ALT 25  --   --   --   --   ALKPHOS 48  --   --   --   --   BILITOT 1.3*  --   --   --   --   GFRNONAA >60   < > >60 >60 >60  ANIONGAP 6   < > 8 8 7    < > = values in this interval not displayed.     Hematology Recent Labs  Lab 04/14/21 0516  WBC 8.1  RBC 4.89  HGB 16.0  HCT 49.6  MCV 101.4*  MCH 32.7  MCHC 32.3  RDW 13.2  PLT 289     Patient Profile     Jose Tebbetts Carpenteris a 75 y.o.malewith hypertension, morbid obesity, chronic venous insufficiency, possible alcohol abuse who was admitted on 04/13/2021 with acute decompensated systolic heart failure. Found to be in atrial flutter as well as afib with RVR.Patient had TEE guided cardioversion on May 16 (ejection fraction 25 to 30%, moderate RV dysfunction, moderate left atrial enlargement, moderate right atrial enlargement, mild mitral regurgitation).  He did not hold sinus rhythm.  Had repeat cardioversion May 23 but atrial fibrillation again recurred.  Assessment  & Plan    1 acute systolic congestive heart failure-patient remains volume overloaded.  Increase Lasix to 120 mg IV 3 times daily.  Continue spironolactone.  Follow renal function closely.  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  There may also be a contribution from alcohol.  We will continue low-dose ARB as blood pressure is borderline.  Continue Toprol both for cardiomyopathy and to help with rate control.  Will need follow-up echocardiogram 3 months after heart rate controlled to see if LV function improved.  If not would need ischemia evaluation at that time.    3 atrial fibrillation/flutter-patient remains in atrial fibrillation with elevated heart rate.  We will continue amiodarone, beta-blocker and digoxin.  He has been cardioverted successfully twice but atrial fibrillation quickly returned.  I am planning on repeating cardioversion on Friday of this week.  I am hopeful now that he has been diuresed and more amiodarone is in his system he will hold sinus rhythm.  We will continue apixaban.    4 hypertension-blood pressure controlled.  5 obesity-he will need an outpatient sleep study and also needs weight loss.  For questions or updates, please contact [place] Please consult [email or web address] for contact info under        Signed, Kirk Ruths, MD  04/15/2021, 7:56 AM

## 2021-04-15 NOTE — TOC Progression Note (Signed)
Transition of Care [facility]) - Progression Note    Patient Details  Name: Jose Fitzpatrick MRN: [id number] Date of Birth: June 17, 1946  [facility] CM/SW [place], [place] Phone Number: 04/15/2021, 1:07 PM  Clinical Narrative:     Patient has SNF bed at [facility]. CSW will start insurance authorization close to patient being medically ready. CSW will continue to follow and assist with discharge planning needs.    Expected Discharge Plan: Home/Self Care Barriers to Discharge: Continued Medical Work up  Expected Discharge Plan and Services Expected Discharge Plan: Home/Self Care   Discharge Planning Services: CM Consult   Living arrangements for the past 2 months: Single Family Home                                       Social Determinants of Health (SDOH) Interventions    Readmission Risk Interventions No flowsheet data found.

## 2021-04-15 DEATH — deceased

## 2021-04-16 ENCOUNTER — Encounter (HOSPITAL_COMMUNITY): Payer: Self-pay | Admitting: Anesthesiology

## 2021-04-16 DIAGNOSIS — I4891 Unspecified atrial fibrillation: Secondary | ICD-10-CM | POA: Diagnosis not present

## 2021-04-16 DIAGNOSIS — I5021 Acute systolic (congestive) heart failure: Secondary | ICD-10-CM | POA: Diagnosis not present

## 2021-04-16 LAB — BASIC METABOLIC PANEL
Anion gap: 7 (ref 5–15)
BUN: 37 mg/dL — ABNORMAL HIGH (ref 8–23)
CO2: 37 mmol/L — ABNORMAL HIGH (ref 22–32)
Calcium: 8.7 mg/dL — ABNORMAL LOW (ref 8.9–10.3)
Chloride: 88 mmol/L — ABNORMAL LOW (ref 98–111)
Creatinine, Ser: 1.34 mg/dL — ABNORMAL HIGH (ref 0.61–1.24)
GFR, Estimated: 55 mL/min — ABNORMAL LOW (ref >60)
Glucose, Bld: 122 mg/dL — ABNORMAL HIGH (ref 70–99)
Potassium: 4.6 mmol/L (ref 3.5–5.1)
Sodium: 132 mmol/L — ABNORMAL LOW (ref 135–145)

## 2021-04-16 LAB — PROTIME-INR
INR: 1.9 — ABNORMAL HIGH (ref 0.8–1.2)
Prothrombin Time: 21.3 seconds — ABNORMAL HIGH (ref 11.4–15.2)

## 2021-04-16 MED ORDER — AMIODARONE HCL 200 MG PO TABS
400.0000 mg | ORAL_TABLET | Freq: Every day | ORAL | Status: DC
  Administered 2021-04-16: 400 mg via ORAL
  Filled 2021-04-16: qty 2

## 2021-04-16 MED ORDER — FUROSEMIDE 10 MG/ML IJ SOLN
80.0000 mg | Freq: Two times a day (BID) | INTRAMUSCULAR | Status: DC
  Administered 2021-04-16: 80 mg via INTRAVENOUS
  Filled 2021-04-16: qty 8

## 2021-04-16 NOTE — Progress Notes (Signed)
OT Note  04/16/21 1100 04/16/21 1110 04/16/21 1125  Tilt Bed  Angle 22 degrees 32 degrees 26 degrees  Total Minutes in Angle 5 minutes 5 minutes 5 minutes  BP 121/78 94/63 113/73  SpO2 100 % 100 % 100 %  O2 Device Nasal Cannula Nasal Cannula Nasal Cannula  O2 Flow Rate (L/min) 5 L/min 5 L/min 5 L/min  Patient Response Cooperative Flat affect Cooperative;Flat affect  Maurie Boettcher, OT/L   Acute OT Clinical Specialist [place] Pager [phone] Office [phone]

## 2021-04-16 NOTE — Anesthesia Preprocedure Evaluation (Deleted)
Anesthesia Evaluation    Airway        Dental   Pulmonary neg pulmonary ROS, former smoker,           Cardiovascular hypertension, Pt. on medications and Pt. on home beta blockers +CHF  + dysrhythmias Atrial Fibrillation      Neuro/Psych negative neurological ROS  negative psych ROS   GI/Hepatic negative GI ROS, Neg liver ROS,   Endo/Other  Morbid obesity  Renal/GU negative Renal ROS  negative genitourinary   Musculoskeletal negative musculoskeletal ROS (+)   Abdominal   Peds  Hematology negative hematology ROS (+)   Anesthesia Other Findings   Reproductive/Obstetrics                             Anesthesia Physical Anesthesia Plan  ASA: III  Anesthesia Plan: General   Post-op Pain Management:    Induction: Intravenous  PONV Risk Score and Plan: 2 and Propofol infusion  Airway Management Planned: Mask  Additional Equipment: None  Intra-op Plan:   Post-operative Plan:   Informed Consent:   Plan Discussed with:   Anesthesia Plan Comments: (Lab Results      Component                Value               Date                      WBC                      8.1                 04/14/2021                HGB                      16.0                04/14/2021                HCT                      49.6                04/14/2021                MCV                      101.4 (H)           04/14/2021                PLT                      289                 04/14/2021           Lab Results      Component                Value               Date                      NA  132 (L)             04/16/2021                K                        4.6                 04/16/2021                CO2                      37 (H)              04/16/2021                GLUCOSE                  122 (H)             04/16/2021                BUN                      37 (H)               04/16/2021                CREATININE               1.34 (H)            04/16/2021                CALCIUM                  8.7 (L)             04/16/2021                GFRNONAA                 55 (L)              04/16/2021                GFRAA                    >60                 07/20/2015          )        Anesthesia Quick Evaluation

## 2021-04-16 NOTE — Progress Notes (Signed)
Orthopedic Tech Progress Note Patient Details:  NORFLEET CAPERS 1945-12-05 [id number]  Ortho Devices Type of Ortho Device: Louretta Parma boot Ortho Device/Splint Location: BLE Ortho Device/Splint Interventions: Ordered,Application,Adjustment   Post Interventions Patient Tolerated: Well Instructions Provided: Care of device,Poper ambulation with device   Ferry Matthis 04/16/2021, 4:38 PM

## 2021-04-16 NOTE — Progress Notes (Signed)
PROGRESS NOTE    Jose Fitzpatrick  ZTI:[id number] DOB: Oct 05, 1946 DOA: 03/23/2021 PCP: Tamsen Roers, MD    No chief complaint on file.   Brief Narrative: 75 year old gentleman prior history of hypertension, morbid obesity, alcohol abuse, chronic venous insufficiency admitted for acute decompensated heart failure was also found to be in atrial flutter/atrial fibrillation with RVR patient underwent TEE guided cardioversion on Mar 30, 2021, had repeat cardioversion May 23 but his atrial fibrillation reoccurred. He is scheduled for cardioversion in am.    Assessment & Plan:   Principal Problem:   Atrial fibrillation with RVR ([place]) Active Problems:   HTN (hypertension)   Acute CHF (congestive heart failure) (HCC)   Acute respiratory failure with hypoxia (HCC)   Elevated TSH   BMI 50.0-59.9, adult (HCC)   Hypercapnic respiratory failure (HCC)   Pressure injury of skin    Atrial fibrillation with RVR Underwent DCCV cardioversion on May 16 and 23 with recurrence of atrial fibrillation.  Continue with Eliquis for anticoagulation , amiodarone 400 mg daily for 2 weeks followed by 200 mg daily, and metoprolol 25 mg daily. Pt remains in afib with rates in 110/min.plan for cardioversion tomorrow as per cardiology.    Acute systolic congestive heart failure Improving, but not backto basline.  Continue with spironolactone, decrease the dose of Lasix to 80 mg twice daily, low-dose Toprol and losartan.  Patient will need repeat echocardiogram in 3 months to see if LV function has improved. Urine output only 650 Ml? Continue strict intake and output and daily weights Nasal cannula oxygen to keep sats greater than 90%.currntly on 2l it of Stem oxygen.     AKI probably secondary to diuresis.  Decreased the dose of lasix to 80 BID.  Continue to monitor renal parameters.    . Essential hypertension Well-controlled blood pressure parameters    Subclinical hypothyroidism Recommend check  ing thyroid panel in 4 weeks.     EtOH use disorder Counseled on cessation TOC for resources   Body mass index is 47.78 kg/m. Morbid obesity. Recommend outpatient follow-up with PCP and may benefit from bariatric evaluation as outpatient. He will need sleep study as an outpatient   Pressure injury present on admission. Pressure Injury 04/05/21 Ear Left Stage 2 -  Partial thickness loss of dermis presenting as a shallow open injury with a red, pink wound bed without slough. from oxygen tubing (Active)  04/05/21 1312  Location: Ear  Location Orientation: Left  Staging: Stage 2 -  Partial thickness loss of dermis presenting as a shallow open injury with a red, pink wound bed without slough.  Wound Description (Comments): from oxygen tubing  Present on Admission: Yes     Pressure Injury 04/05/21 Ear Right Stage 2 -  Partial thickness loss of dermis presenting as a shallow open injury with a red, pink wound bed without slough. from oxygen tubing (Active)  04/05/21 1317  Location: Ear  Location Orientation: Right  Staging: Stage 2 -  Partial thickness loss of dermis presenting as a shallow open injury with a red, pink wound bed without slough.  Wound Description (Comments): from oxygen tubing  Present on Admission: Yes     Pressure Injury 04/13/21 Sacrum Right;Left;Mid Deep Tissue Pressure Injury - Purple or maroon localized area of discolored intact skin or blood-filled blister due to damage of underlying soft tissue from pressure and/or shear. (Active)  04/13/21 0400  Location: Sacrum  Location Orientation: Right;Left;Mid  Staging: Deep Tissue Pressure Injury - Purple or maroon localized  area of discolored intact skin or blood-filled blister due to damage of underlying soft tissue from pressure and/or shear.  Wound Description (Comments):   Present on Admission:    Wound care consult ordered, continue with local wound care.    Deconditioning and debility Therapy evaluations  recommending SNF   Mild hyponatremia probably from diuresis Continue to monitor.  Recheck BMP in the morning    DVT prophylaxis: Eliquis  code Status: DNR  family Communication: None at bedside Disposition:   Status is: Inpatient  Remains inpatient appropriate because:Ongoing diagnostic testing needed not appropriate for outpatient work up, Unsafe d/c plan and IV treatments appropriate due to intensity of illness or inability to take PO   Dispo: The patient is from: Home              Anticipated d/c is to: SNF              Patient currently is not medically stable to d/c.   Difficult to place patient No       Consultants:   Cardiology  Procedures:   Antimicrobials: None   Subjective: Patient reports breathing is the same as yesterday No chest pain  Objective: Vitals:   04/16/21 1100 04/16/21 1110 04/16/21 1125 04/16/21 1544  BP: 121/78 94/63 113/73   Pulse:      Resp:    19  Temp:    (!) 97.3 F (36.3 C)  TempSrc:    Axillary  SpO2: 100% 100% 100% 100%  Weight:      Height:        Intake/Output Summary (Last 24 hours) at 04/16/2021 1731 Last data filed at 04/16/2021 1519 Gross per 24 hour  Intake 1458.13 ml  Output 650 ml  Net 808.13 ml   Filed Weights   04/14/21 0454 04/15/21 0341 04/16/21 0500  Weight: (!) 157.4 kg (!) 172.9 kg (!) 173.4 kg    Examination:  General exam: Alert, not in any kind of distress Respiratory system: Diminished air entry at bases Cardiovascular system: S1-S2 heard, irregularly irregular, tachycardic Gastrointestinal system: Abdomen is soft, nontender nondistended bowel sounds normal Central nervous system: Alert and able to answer questions Extremities: Pedal edema present Skin: Sacral deep tissue injury present Psychiatry: Mood appropriate   Data Reviewed: I have personally reviewed following labs and imaging studies  CBC: Recent Labs  Lab 04/14/21 0516  WBC 8.1  HGB 16.0  HCT 49.6  MCV 101.4*  PLT 289     Basic Metabolic Panel: Recent Labs  Lab 04/10/21 0434 04/11/21 0542 04/12/21 0459 04/13/21 0403 04/14/21 0516 04/16/21 0504  NA 132* 135 135 136 133* 132*  K 3.4* 3.5 3.6 4.0 4.3 4.6  CL 84* 88* 90* 90* 90* 88*  CO2 42* 39* 37* 38* 36* 37*  GLUCOSE 114* 101* 124* 119* 111* 122*  BUN 24* 25* 25* 25* 28* 37*  CREATININE 1.16 1.10 1.24 1.08 1.03 1.34*  CALCIUM 8.1* 8.0* 8.2* 8.5* 8.1* 8.7*  MG 2.1 2.1  --  2.5* 2.3  --     GFR: Estimated Creatinine Clearance: 80.9 mL/min (A) (by C-G formula based on SCr of 1.34 mg/dL (H)).  Liver Function Tests: No results for input(s): AST, ALT, ALKPHOS, BILITOT, PROT, ALBUMIN in the last 168 hours.  CBG: Recent Labs  Lab 04/10/21 2129 04/12/21 0745  GLUCAP 121* 107*     No results found for this or any previous visit (from the past 240 hour(s)).       Radiology Studies: No  results found.      Scheduled Meds: . acetaZOLAMIDE  250 mg Oral BID  . amiodarone  400 mg Oral Daily  . apixaban  5 mg Oral BID  . Chlorhexidine Gluconate Cloth  6 each Topical Daily  . folic acid  1 mg Oral Daily  . furosemide  80 mg Intravenous BID  . gabapentin  600 mg Oral QPM  . losartan  25 mg Oral Daily  . mouth rinse  15 mL Mouth Rinse BID  . metoprolol succinate  25 mg Oral Daily  . multivitamin with minerals  1 tablet Oral Daily  . potassium chloride  40 mEq Oral BID  . sodium chloride flush  10-40 mL Intracatheter Q12H  . spironolactone  25 mg Oral Daily  . testosterone cypionate  200 mg Intramuscular Q14 Days  . thiamine  100 mg Oral Daily   Or  . thiamine  100 mg Intravenous Daily  . vitamin B-12  1,000 mcg Oral Daily  . Zinc Oxide   Topical TID   Continuous Infusions: . sodium chloride 500 mL (04/10/21 2133)  . sodium chloride       LOS: 23 days        Hosie Poisson, MD [facility]   To contact the attending provider between 7A-7P or the covering provider during after hours 7P-7A, please log into the web  site [email or web address] and access using universal [place] password for that web site. If you do not have the password, please call the hospital operator.  04/16/2021, 5:31 PM

## 2021-04-16 NOTE — Plan of Care (Signed)

## 2021-04-16 NOTE — Progress Notes (Signed)
Heart Failure Stewardship Pharmacist Progress Note   PCP: Tamsen Roers, MD PCP-Cardiologist: Kirk Ruths, MD    HPI:  75 YO male with PMH significant for HTN, morbid obesity, possible alcohol abuse. Admitted 03/20/2021 with shortness of breath and acute decompensated systolic HF; also in atrial flutter and afib with RVR. Underwent TEE guided [facility] on 5/16, however afib returned. Repeated cardioversion on 5/23 but he remains in afib with elevated heart rate. Planning repeat cardioversion on Friday this week. Newly diagnosed CHF with EF of 25-30%.   Current HF Medications: Diamox 250 mg BID Furosemide 80 mg IV BID Metoprolol XL 25 mg daily Losartan 25 mg daily Spironolactone 25 mg daily  Prior to admission HF Medications: Metoprolol succinate 50 mg daily  Lisinopril-HCTZ 20-25 mg   Pertinent Lab Values: . Serum creatinine 1.34, BUN 37, Potassium 4.6, Sodium 132, BNP 77.5, Magnesium 2.3, Digoxin 0.3 (5/22), Digoxin <0.2 (5/27), Digoxin 0.8 (6/1)  Vital Signs: . Weight: 382 lbs (admission weight: 400 lbs) . Blood pressure: 110/70s . Heart rate: 110s    Medication Assistance / Insurance Benefits Check: Does the patient have prescription insurance?  Yes Type of insurance plan: [employer]  Outpatient Pharmacy:  Prior to admission outpatient [facility] Is the patient willing to use Center at discharge? Pending Is the patient willing to transition their outpatient pharmacy to utilize a [facility] outpatient pharmacy?   Pending    Assessment: 1. Acute on chronic systolic CHF (EF 85-46%). NYHA class II/III symptoms. - Continue diamox 250 mg BID. CO2 down from 40s>37 - Agree with decreasing furosemide back to 80 mg BID. Minimal documented output yesterday. Weight up to 382 lbs. 2+ edema on MD exam. Worsening renal function.  - Continue metoprolol XL 25 mg daily - Continue losartan 25 mg daily - consider optimizing to [facility] prior to discharge. Please note that he  was taking lisinopril-hctz PTA - Continue spironolactone 25 mg daily - Holding off on SGLT2i for now - UA positive for E coli, may exacerbate. Consider starting in future if found appropriate. - Holding digoxin with level up to 0.8 today   Plan: 1) Medication changes recommended at this time: - Agree with changes above  2) Patient assistance: - Jardiance Copay: $45/month - Farxiga Copay: $95/month - Unable to complete copay test for Entresto due to recent fill of lisinopril  - Dispo: possible CIR vs SNF  3)  Education  - To be completed prior to discharge  Kerby Nora, PharmD, BCPS Heart Failure Stewardship Pharmacist Phone [phone]

## 2021-04-16 NOTE — Progress Notes (Signed)
Progress Note  Patient Name: Jose Fitzpatrick Date of Encounter: 04/16/2021  [facility] Cardiologist: Kirk Ruths, MD   Subjective   No CP or dyspnea  Inpatient Medications    Scheduled Meds: . acetaZOLAMIDE  250 mg Oral BID  . apixaban  5 mg Oral BID  . Chlorhexidine Gluconate Cloth  6 each Topical Daily  . digoxin  0.25 mg Oral Daily  . folic acid  1 mg Oral Daily  . gabapentin  600 mg Oral QPM  . losartan  25 mg Oral Daily  . mouth rinse  15 mL Mouth Rinse BID  . metoprolol succinate  25 mg Oral Daily  . multivitamin with minerals  1 tablet Oral Daily  . potassium chloride  40 mEq Oral BID  . sodium chloride flush  10-40 mL Intracatheter Q12H  . spironolactone  25 mg Oral Daily  . testosterone cypionate  200 mg Intramuscular Q14 Days  . thiamine  100 mg Oral Daily   Or  . thiamine  100 mg Intravenous Daily  . vitamin B-12  1,000 mcg Oral Daily  . Zinc Oxide   Topical TID   Continuous Infusions: . sodium chloride 500 mL (04/10/21 2133)  . sodium chloride    . amiodarone 30 mg/hr (04/16/21 0834)  . furosemide 62 mL/hr at 04/16/21 0654   PRN Meds: sodium chloride, Place/Maintain arterial line **AND** sodium chloride, acetaminophen, docusate sodium, polyethylene glycol, sodium chloride flush   Vital Signs    Vitals:   04/15/21 2043 04/16/21 0045 04/16/21 0500 04/16/21 0700  BP: 99/74 (!) 118/95 120/86 (!) 121/98  Pulse:  98 (!) 105 79  Resp: 17 20 17  (!) 24  Temp: 97.8 F (36.6 C) 97.7 F (36.5 C) (!) 97.4 F (36.3 C) (!) 97.4 F (36.3 C)  TempSrc: Axillary Axillary Axillary Oral  SpO2:  100% 92% 98%  Weight:   (!) 173.4 kg   Height:        Intake/Output Summary (Last 24 hours) at 04/16/2021 0911 Last data filed at 04/16/2021 0654 Gross per 24 hour  Intake 1470.13 ml  Output 650 ml  Net 820.13 ml   Last 3 Weights 04/16/2021 04/15/2021 04/14/2021  Weight (lbs) 382 lb 4.4 oz 381 lb 2.8 oz 347 lb  Weight (kg) 173.4 kg 172.9 kg 157.398 kg       Telemetry    Atrial fibrillation; rate elevated- Personally Reviewed   Physical Exam   GEN: Obese, NAD Neck: supple Cardiac: irregular and tachycardic, no gallop Respiratory: CTA anteriorly; no wheeze GI: Soft, NT/ND MS: chronic skin changes; 2+ edema Neuro:   No focal findings Psych: Normal affect   Labs    High Sensitivity Troponin:   Recent Labs  Lab 04/13/2021 2023 03/27/2021 2147  TROPONINIHS 12 12      Chemistry Recent Labs  Lab 04/13/21 0403 04/14/21 0516 04/16/21 0504  NA 136 133* 132*  K 4.0 4.3 4.6  CL 90* 90* 88*  CO2 38* 36* 37*  GLUCOSE 119* 111* 122*  BUN 25* 28* 37*  CREATININE 1.08 1.03 1.34*  CALCIUM 8.5* 8.1* 8.7*  GFRNONAA >60 >60 55*  ANIONGAP 8 7 7      Hematology Recent Labs  Lab 04/14/21 0516  WBC 8.1  RBC 4.89  HGB 16.0  HCT 49.6  MCV 101.4*  MCH 32.7  MCHC 32.3  RDW 13.2  PLT 289     Patient Profile     Jose A Carpenteris a 75 y.o.malewith hypertension, morbid obesity,  chronic venous insufficiency, possible alcohol abuse who was admitted on 04/10/2021 with acute decompensated systolic heart failure. Found to be in atrial flutter as well as afib with RVR.Patient had TEE guided cardioversion on May 16 (ejection fraction 25 to 30%, moderate RV dysfunction, moderate left atrial enlargement, moderate right atrial enlargement, mild mitral regurgitation).  He did not hold sinus rhythm.  Had repeat cardioversion May 23 but atrial fibrillation again recurred.  Assessment & Plan    1 acute systolic congestive heart failure-patient remains volume overloaded on exam; however renal function worse today.  We will decrease Lasix to 80 mg twice daily.  Continue spironolactone.  Follow renal function.  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  We will continue low-dose Toprol and losartan.  Will need follow-up echocardiogram 3 months after heart rate controlled to see if LV function improved.  If not would need  ischemia evaluation at that time.    3 atrial fibrillation/flutter-heart rate is improved.  I will discontinue digoxin and change amiodarone to 400 mg daily for 2 weeks then 200 mg daily thereafter.  Continue low-dose beta-blocker.  He has been cardioverted to previous times with atrial fibrillation recurring each time.  Plan is to reattempt tomorrow and hopefully now that more amiodarone is in his system he will hold sinus rhythm.  Continue apixaban.    4 hypertension-blood pressure controlled.  5 obesity-he will need an outpatient sleep study and also needs weight loss.  For questions or updates, please contact [place] Please consult [email or web address] for contact info under        Signed, Kirk Ruths, MD  04/16/2021, 9:11 AM

## 2021-04-16 NOTE — Progress Notes (Signed)
Occupational Therapy Treatment Patient Details Name: Jose Fitzpatrick MRN: [id number] DOB: 1945-11-19 Today's Date: 04/16/2021    History of present illness Pt is a 75 y.o. male admitted to [facility] on 03/19/2021 with BLE swelling, DOE, weaight gain. Workup for CHF exacerbation, aflutter/afib with RVR. S/p TEE guided DCCV 5/16 but quickly returned to afib. Pt with hypotension and transfer to [facility] on 5/21. S/p repeat DCCV 5/23. Converted back to aflutter 5/24. Plan for repeat cardioversion 6/3. 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   OT comments  Pt making incremental progress on OT goals this session. OT focused session on Weight Bearing through BLE and completing simple exercises. The tilt bed was utilized this session and at this time, pt can tolerate the tilt of the bed being at  32 degrees before he begins to complain of woozyness and his BP drops. In prior OT note from today, tilt ranges along with BP and O2 levels have been documented. While in the 32 degrees tilt, pt worked on UE and LE exercises as well as moving himself from side to side in the bed and leaning forward to engage his core. All activities are geared towards assisting pt in tolerating more weight through his legs, as well as working on increasing his BP to tolerate higher tilts in next sessions. Acute OT will continue to follow up with pt to continue progressing towards higher levels of mobility and ADL performance.    Follow Up Recommendations  SNF    Equipment Recommendations  None recommended by OT    Recommendations for Other Services      Precautions / Restrictions Precautions Precautions: Fall;Other (comment) Precaution Comments: Watch HR (afib/aflutter in 120s at rest); bowel/bladder incontinence Restrictions Weight Bearing Restrictions: No       Mobility Bed Mobility Overal bed mobility: Needs Assistance             General bed mobility comments: Pt worked on pushing himself to both sides of the  bed to work on UE and core engagement as well as preparing for rolling. Angle: 26 degrees Total Minutes in Angle: 5 minutes Patient Response: Cooperative;Flat affect  Transfers                 General transfer comment: Deferred transfer this session, used tilt bed to work on weightbearing through [place].    Balance                                           ADL either performed or assessed with clinical judgement   ADL Overall ADL's : Needs assistance/impaired                                       General ADL Comments: Using Tilt bed this session, tilt ranges, with BP and O2 and time tolerated in other OT note.     Vision       Perception     Praxis      Cognition Arousal/Alertness: Lethargic Behavior During Therapy: Flat affect Overall Cognitive Status: Within Functional Limits for tasks assessed                                 General Comments: WFL for tasks; flat affect with  minimal conversation during session        Exercises Exercises: Other exercises Other Exercises Other Exercises: engaging quads, tilted to 32 degrees, 10 reps, BLE Other Exercises: Sqeezing glutes, 32 degrees tilted in bed, 10 reps Other Exercises: Punching up to the ceiling, BUE, 10 reps, 32 degrees tilted Other Exercises: Pushing and pulling, BUE 32 degrees tilted, resistance added, 10 reps Other Exercises: Leaning head forward to engage core x5   Shoulder Instructions       General Comments VSS, Highest HR seen 120. on 5L 100% during tilting    Pertinent Vitals/ Pain       Pain Assessment: No/denies pain  Home Living                                          Prior Functioning/Environment              Frequency  Min 2X/week        Progress Toward Goals  OT Goals(current goals can now be found in the care plan section)  Progress towards OT goals: Progressing toward goals  Acute Rehab OT  Goals Patient Stated Goal: None stated OT Goal Formulation: With patient Time For Goal Achievement: 04/28/21 Potential to Achieve Goals: Good ADL Goals Pt Will Perform Lower Body Dressing: with mod assist;with adaptive equipment;sit to/from stand;sitting/lateral leans Pt Will Transfer to Toilet: with max assist;stand pivot transfer;bedside commode Pt Will Perform Toileting - Clothing Manipulation and hygiene: with mod assist;sitting/lateral leans;sit to/from stand Pt/caregiver will Perform Home Exercise Program: Both right and left upper extremity;Increased strength Additional ADL Goal #1: Patient will stand at sink to perform grooming task as evidence of improving activity tolerance  Plan Discharge plan remains appropriate;Frequency remains appropriate    Co-evaluation                 AM-PAC OT "6 Clicks" Daily Activity     Outcome Measure   Help from another person eating meals?: None Help from another person taking care of personal grooming?: A Little Help from another person toileting, which includes using toliet, bedpan, or urinal?: Total Help from another person bathing (including washing, rinsing, drying)?: A Lot Help from another person to put on and taking off regular upper body clothing?: A Little Help from another person to put on and taking off regular lower body clothing?: Total 6 Click Score: 14    End of Session Equipment Utilized During Treatment: Oxygen  OT Visit Diagnosis: History of falling (Z91.81);Muscle weakness (generalized) (M62.81)   Activity Tolerance Patient tolerated treatment well;Patient limited by lethargy   Patient Left in bed;with call bell/phone within reach   Nurse Communication Mobility status        Time: 2130-8657 OT Time Calculation (min): 54 min  Charges: OT General Charges $OT Visit: 1 Visit OT Treatments $Therapeutic Activity: 23-37 mins $Therapeutic Exercise: 23-37 mins  Avenly Roberge H., OTR/L Acute Rehabilitation  Ernestene Coover  Elane Eldred Sooy 04/16/2021, 12:27 PM

## 2021-04-17 ENCOUNTER — Inpatient Hospital Stay (HOSPITAL_COMMUNITY): Payer: Medicare HMO

## 2021-04-17 ENCOUNTER — Encounter (HOSPITAL_COMMUNITY): Payer: Self-pay | Admitting: Pulmonary Disease

## 2021-04-17 ENCOUNTER — Encounter (HOSPITAL_COMMUNITY): Admission: EM | Disposition: E | Payer: Self-pay | Source: Home / Self Care | Attending: Internal Medicine

## 2021-04-17 DIAGNOSIS — J9601 Acute respiratory failure with hypoxia: Secondary | ICD-10-CM | POA: Diagnosis not present

## 2021-04-17 DIAGNOSIS — I4891 Unspecified atrial fibrillation: Secondary | ICD-10-CM | POA: Diagnosis not present

## 2021-04-17 LAB — CBC WITH DIFFERENTIAL/PLATELET
Abs Immature Granulocytes: 0.54 10*3/uL — ABNORMAL HIGH (ref 0.00–0.07)
Basophils Absolute: 0.1 10*3/uL (ref 0.0–0.1)
Basophils Relative: 1 %
Eosinophils Absolute: 0 10*3/uL (ref 0.0–0.5)
Eosinophils Relative: 0 %
HCT: 51.5 % (ref 39.0–52.0)
Hemoglobin: 16.4 g/dL (ref 13.0–17.0)
Immature Granulocytes: 5 %
Lymphocytes Relative: 8 %
Lymphs Abs: 0.8 10*3/uL (ref 0.7–4.0)
MCH: 33.1 pg (ref 26.0–34.0)
MCHC: 31.8 g/dL (ref 30.0–36.0)
MCV: 103.8 fL — ABNORMAL HIGH (ref 80.0–100.0)
Monocytes Absolute: 0.6 10*3/uL (ref 0.1–1.0)
Monocytes Relative: 5 %
Neutro Abs: 8.6 10*3/uL — ABNORMAL HIGH (ref 1.7–7.7)
Neutrophils Relative %: 81 %
Platelets: 254 10*3/uL (ref 150–400)
RBC: 4.96 MIL/uL (ref 4.22–5.81)
RDW: 13.7 % (ref 11.5–15.5)
WBC: 10.6 10*3/uL — ABNORMAL HIGH (ref 4.0–10.5)
nRBC: 0 % (ref 0.0–0.2)

## 2021-04-17 LAB — BASIC METABOLIC PANEL
Anion gap: 8 (ref 5–15)
BUN: 50 mg/dL — ABNORMAL HIGH (ref 8–23)
CO2: 34 mmol/L — ABNORMAL HIGH (ref 22–32)
Calcium: 8.6 mg/dL — ABNORMAL LOW (ref 8.9–10.3)
Chloride: 90 mmol/L — ABNORMAL LOW (ref 98–111)
Creatinine, Ser: 2.28 mg/dL — ABNORMAL HIGH (ref 0.61–1.24)
GFR, Estimated: 29 mL/min — ABNORMAL LOW (ref >60)
Glucose, Bld: 141 mg/dL — ABNORMAL HIGH (ref 70–99)
Potassium: 5.4 mmol/L — ABNORMAL HIGH (ref 3.5–5.1)
Sodium: 132 mmol/L — ABNORMAL LOW (ref 135–145)

## 2021-04-17 LAB — LACTIC ACID, PLASMA: Lactic Acid, Venous: 1.8 mmol/L (ref 0.5–1.9)

## 2021-04-17 SURGERY — CANCELLED PROCEDURE

## 2021-04-17 MED ORDER — VANCOMYCIN VARIABLE DOSE PER UNSTABLE RENAL FUNCTION (PHARMACIST DOSING): Status: DC

## 2021-04-17 MED ORDER — MORPHINE SULFATE (PF) 2 MG/ML IV SOLN: 1.0000 mg | INTRAVENOUS | Status: DC | PRN

## 2021-04-17 MED ORDER — SODIUM CHLORIDE 0.9 % IV SOLN
2.0000 g | Freq: Two times a day (BID) | INTRAVENOUS | Status: DC
  Administered 2021-04-17: 2 g via INTRAVENOUS
  Filled 2021-04-17: qty 2

## 2021-04-17 MED ORDER — VANCOMYCIN HCL 10 G IV SOLR
2500.0000 mg | Freq: Once | INTRAVENOUS | Status: AC
  Administered 2021-04-17: 2500 mg via INTRAVENOUS
  Filled 2021-04-17: qty 2500

## 2021-04-21 ENCOUNTER — Ambulatory Visit: Payer: Medicare HMO

## 2021-05-15 NOTE — Care Management Important Message (Signed)
Important Message  Patient Details  Name: SKIPPY MARHEFKA MRN: [id number] Date of Birth: [birth year]   Medicare Important Message Given:  Yes     Shelda Altes 04/28/2021, 10:16 AM

## 2021-05-15 NOTE — Discharge Summary (Signed)
Death Summary  Jose Fitzpatrick QIW:[id number] DOB: [birth year] DOA: 04-13-21  PCP: Tamsen Roers, MD  Admit date: 04/13/2021 Date of Death: 05/07/2021 Time of Death: 02:05 PM   History of present illness:  75 year old gentleman prior history of hypertension, morbid obesity, alcohol abuse, chronic venous insufficiency admitted for acute decompensated heart failure was also found to be in atrial flutter/atrial fibrillation with RVR patient underwent TEE guided cardioversion on Mar 30, 2021, had repeat cardioversion May 23 but his atrial fibrillation reoccurred. He was scheduled for cardioversion the morning, but pt became lethargic and hypoxic, requiring BIPAP, without much improvement. Family at bedside and requested transition to comfort measures.    Final Diagnoses:   Atrial fibrillation with RVR ([place]) Active Problems:   HTN (hypertension)   Acute CHF (congestive heart failure) (HCC)   Acute respiratory failure with hypoxia (HCC)   Elevated TSH   BMI 50.0-59.9, adult (HCC)   Hypercapnic respiratory failure (HCC)   Pressure injury of skin    The results of significant diagnostics from this hospitalization (including imaging, microbiology, ancillary and laboratory) are listed below for reference.    Significant Diagnostic Studies: DG Chest 2 View  Result Date: Apr 13, 2021 CLINICAL DATA:  Chest pain history colon cancer EXAM: CHEST - 2 VIEW COMPARISON:  None. FINDINGS: Small bilateral effusions with basilar airspace disease. Mild cardiomegaly with vascular congestion and interstitial pulmonary edema. No pneumothorax IMPRESSION: Mild cardiomegaly with vascular congestion, interstitial pulmonary edema and small bilateral effusions. Electronically Signed   By: Donavan Foil M.D.   On: 04/13/21 20:18   DG Chest Port 1 View  Result Date: 2021/05/07 CLINICAL DATA:  75 year old male with history of mental confusion. EXAM: PORTABLE CHEST 1 VIEW COMPARISON:  Chest x-ray 04/07/2021.  FINDINGS: Patchy multifocal airspace consolidation noted in the lungs bilaterally, throughout the entire left lung and evident at the right lung base. Moderate bilateral (left greater than right) pleural effusions. No pneumothorax. No evidence of pulmonary edema. Moderate cardiomegaly. Upper mediastinal contours are within normal limits allowing for patient positioning. Small bore right upper extremity PICC noted, but the tip cannot be reliably localized on today's image. IMPRESSION: 1. Multilobar bilateral pneumonia with moderate bilateral pleural effusions. 2. Cardiomegaly. Electronically Signed   By: Vinnie Langton M.D.   On: May 07, 2021 07:51   DG CHEST PORT 1 VIEW  Result Date: 04/07/2021 CLINICAL DATA:  Dyspnea. EXAM: PORTABLE CHEST 1 VIEW COMPARISON:  Apr 04, 2021. FINDINGS: Stable cardiomediastinal silhouette. No pneumothorax is noted. Bibasilar interstitial densities are noted consistent with subsegmental atelectasis or edema with associated small pleural effusions. Bony thorax is unremarkable. IMPRESSION: Bibasilar subsegmental atelectasis or edema is noted with associated small pleural effusions. Electronically Signed   By: Marijo Conception M.D.   On: 04/07/2021 12:09   DG CHEST PORT 1 VIEW  Result Date: 04/04/2021 CLINICAL DATA:  Hypertensive.  Shortness of breath. EXAM: PORTABLE CHEST 1 VIEW COMPARISON:  04-13-2021 FINDINGS: Stable cardiomegaly. The hila and mediastinum are unchanged. Bilateral pleural effusions with underlying atelectasis. A new right PICC line terminates in the central SVC. No other acute abnormalities. IMPRESSION: 1. A new right PICC line terminates in the central SVC. No pneumothorax. 2. Small bilateral pleural effusions with underlying atelectasis. Electronically Signed   By: Dorise Bullion III M.D   On: 04/04/2021 15:04   ECHO TEE  Result Date: 03/17/2021    TRANSESOPHOGEAL ECHO REPORT   Patient Name:   Jose Fitzpatrick Date of Exam: 03/23/2021 Medical Rec #:   [id number]  Height:       75.0 in Accession #:    [id number]        Weight:       407.2 lb Date of Birth:  1946-01-16        BSA:          2.969 m Patient Age:    60 years          BP:           98/72 mmHg Patient Gender: M                 HR:           129 bpm. Exam Location:  Inpatient Procedure: Transesophageal Echo, Color Doppler and Cardiac Doppler Indications:     atrial fibrillation  History:         Patient has prior history of Echocardiogram examinations, most                  recent 03/27/2021. CHF, Arrythmias:Atrial Fibrillation; Risk                  Factors:Hypertension.  Sonographer:     Johny Chess Referring Phys:  [id number] Diagnosing Phys: Gwyndolyn Kaufman MD PROCEDURE: After discussion of the risks and benefits of a TEE, an informed consent was obtained from the patient. The transesophogeal probe was passed without difficulty through the esophogus of the patient. Imaged were obtained with the patient in a left lateral decubitus position. Local oropharyngeal anesthetic was provided with Cetacaine. Sedation performed by different physician. Patients was under conscious sedation during this procedure. The patient developed no complications during the procedure. A direct current cardioversion was performed. IMPRESSIONS  1. Left ventricular ejection fraction, by estimation, is 25 to 30%. The left ventricle has severely decreased function.  2. Right ventricular systolic function is moderately reduced. The right ventricular size is normal.  3. Left atrial size was moderately dilated. No left atrial/left atrial appendage thrombus was detected.  4. Right atrial size was moderately dilated.  5. The mitral valve is normal in structure. Mild mitral valve regurgitation. No evidence of mitral stenosis.  6. The aortic valve is tricuspid. There is mild calcification of the aortic valve. There is mild thickening of the aortic valve. Aortic valve regurgitation is not visualized. Mild aortic  valve sclerosis is present, with no evidence of aortic valve stenosis. FINDINGS  Left Ventricle: Left ventricular ejection fraction, by estimation, is 25 to 30%. The left ventricle has severely decreased function. The left ventricular internal cavity size was normal in size. Right Ventricle: The right ventricular size is normal. No increase in right ventricular wall thickness. Right ventricular systolic function is moderately reduced. Left Atrium: Left atrial size was moderately dilated. Spontaneous echo contrast was present. No left atrial/left atrial appendage thrombus was detected. Right Atrium: Right atrial size was moderately dilated. Pericardium: There is no evidence of pericardial effusion. Mitral Valve: The mitral valve is normal in structure. There is mild thickening of the mitral valve leaflet(s). There is mild calcification of the mitral valve leaflet(s). Mild mitral annular calcification. Mild mitral valve regurgitation. No evidence of  mitral valve stenosis. Tricuspid Valve: The tricuspid valve is normal in structure. Tricuspid valve regurgitation is mild. Aortic Valve: The aortic valve is tricuspid. There is mild calcification of the aortic valve. There is mild thickening of the aortic valve. Aortic valve regurgitation is not visualized. Mild aortic valve sclerosis is present, with no evidence of aortic valve stenosis.  Pulmonic Valve: The pulmonic valve was normal in structure. Pulmonic valve regurgitation is not visualized. Aorta: The aortic root and ascending aorta are structurally normal, with no evidence of dilitation. IAS/Shunts: No atrial level shunt detected by color flow Doppler. Gwyndolyn Kaufman MD Electronically signed by Gwyndolyn Kaufman MD Signature Date/Time: 03/27/2021/5:45:30 PM    Final    [place] EKG SITE RITE  Result Date: 04/08/2021 If Site Rite image not attached, placement could not be confirmed due to current cardiac rhythm.  [place] EKG SITE RITE  Result Date: 03/31/2021 If Site  Rite image not attached, placement could not be confirmed due to current cardiac rhythm.   Microbiology: No results found for this or any previous visit (from the past 240 hour(s)).   Labs: Basic Metabolic Panel: Recent Labs  Lab 04/16/21 0504 05/08/21 0447  NA 132* 132*  K 4.6 5.4*  CL 88* 90*  CO2 37* 34*  GLUCOSE 122* 141*  BUN 37* 50*  CREATININE 1.34* 2.28*  CALCIUM 8.7* 8.6*   Liver Function Tests: No results for input(s): AST, ALT, ALKPHOS, BILITOT, PROT, ALBUMIN in the last 168 hours. No results for input(s): LIPASE, AMYLASE in the last 168 hours. No results for input(s): AMMONIA in the last 168 hours. CBC: Recent Labs  Lab 08-May-2021 0800  WBC 10.6*  NEUTROABS 8.6*  HGB 16.4  HCT 51.5  MCV 103.8*  PLT 254   Cardiac Enzymes: No results for input(s): CKTOTAL, CKMB, CKMBINDEX, TROPONINI in the last 168 hours. D-Dimer No results for input(s): DDIMER in the last 72 hours. BNP: Invalid input(s): POCBNP CBG: No results for input(s): GLUCAP in the last 168 hours. Anemia work up No results for input(s): VITAMINB12, FOLATE, FERRITIN, TIBC, IRON, RETICCTPCT in the last 72 hours. Urinalysis    Component Value Date/Time   COLORURINE YELLOW 04/04/2021 1250   APPEARANCEUR CLOUDY (A) 04/04/2021 1250   LABSPEC 1.008 04/04/2021 1250   PHURINE 5.0 04/04/2021 1250   GLUCOSEU NEGATIVE 04/04/2021 1250   HGBUR MODERATE (A) 04/04/2021 1250   BILIRUBINUR NEGATIVE 04/04/2021 1250   KETONESUR NEGATIVE 04/04/2021 1250   PROTEINUR NEGATIVE 04/04/2021 1250   UROBILINOGEN 0.2 07/20/2015 2041   NITRITE NEGATIVE 04/04/2021 1250   LEUKOCYTESUR LARGE (A) 04/04/2021 1250   Sepsis Labs Invalid input(s): PROCALCITONIN,  WBC,  LACTICIDVEN     SIGNED:  Hosie Poisson, MD  [facility] 04/22/2021, 10:06 PM

## 2021-05-15 NOTE — Progress Notes (Signed)
   Palliative Medicine Inpatient Follow Up Note   Chart reviewed. I spoke to Dr. Karleen Hampshire this afternoon, unfortunately, Jose Fitzpatrick passed away prior to PMT being able to see him.   We will be available to patients family if any additional resources are needed.  No Charge ______________________________________________________________________________________ [place] Team Team Cell Phone: [phone] Please utilize secure chat with additional questions, if there is no response within 30 minutes please call the above phone number  Palliative Medicine Team providers are available by phone from 7am to 7pm daily and can be reached through the team cell phone.  Should this patient require assistance outside of these hours, please call the patient's attending physician.

## 2021-05-15 NOTE — Progress Notes (Signed)
Pharmacy Antibiotic Note  Jose Fitzpatrick is a 75 y.o. male admitted on 03/27/2021 with shortness of breath.  Pharmacy has been consulted for vancomycin and cefepime dosing.  Patient has had an extended length of stay with complication noted. Patient noted to be lethargic and much less responsive this morning. No fevers or elevated white blood count noted. New orders received to start broad antibiotics for possible pneumonia.   Patient noted to now be suffering from AKI with scr increased from 1.3>2.3.   Plan: Cefepime 2g q12 - low threshold to decrease frequency due to renal impairment  Will give one time vancomycin 2.5g IV then follow up renal function for redosing vs random level Follow bmet daily  Height: 6\' 3"  (190.5 cm) Weight: (!) 174.7 kg (385 lb 2.3 oz) IBW/kg (Calculated) : 84.5  Temp (24hrs), Avg:97.4 F (36.3 C), Min:97.3 F (36.3 C), Max:97.4 F (36.3 C)  Recent Labs  Lab 04/12/21 0459 04/13/21 0403 04/14/21 0516 04/16/21 0504 12-May-2021 0447 05-12-2021 0800  WBC  --   --  8.1  --   --  10.6*  CREATININE 1.24 1.08 1.03 1.34* 2.28*  --     Estimated Creatinine Clearance: 47.8 mL/min (A) (by C-G formula based on SCr of 2.28 mg/dL (H)).    Allergies  Allergen Reactions  . Codeine     Upset stomach   . Sulfa Antibiotics Hives and Rash    Antimicrobials this admission: Vanc 6/3>> Cefepime 6/3>>   Microbiology results: 5/21 bld ng 5/21 urine e.coli/e.faecalis  Thank you for allowing pharmacy to be a part of this patient's care.  Erin Hearing PharmD., BCPS Clinical Pharmacist May 12, 2021 9:43 AM

## 2021-05-15 NOTE — Progress Notes (Signed)
Time of death noted 1405. Myself and Livia Snellen RN listened for 1 minute, no heart sounds and breath sounds, pupil non-reaative to light. Daughter and grand daughter at bedside. Dr Karleen Hampshire notified. [facility] called.

## 2021-05-15 NOTE — Progress Notes (Addendum)
Heart Failure Stewardship Pharmacist Progress Note   PCP: Tamsen Roers, MD PCP-Cardiologist: Kirk Ruths, MD    HPI:  75 YO male with PMH significant for HTN, morbid obesity, possible alcohol abuse. Admitted 04/08/2021 with shortness of breath and acute decompensated systolic HF; also in atrial flutter and afib with RVR. Underwent TEE guided [facility] on 5/16, however afib returned. Repeated cardioversion on 5/23 but he remains in afib with elevated heart rate. Planning repeat cardioversion on Friday this week. Newly diagnosed CHF with EF of 25-30%.   Current HF Medications: Diamox 250 mg BID Metoprolol XL 25 mg daily  Prior to admission HF Medications: Metoprolol succinate 50 mg daily  Lisinopril-HCTZ 20-25 mg   Pertinent Lab Values: . Serum creatinine 1.34>2.28, BUN 50, Potassium 5.4, Sodium 132, BNP 77.5, Magnesium 2.3, Digoxin 0.3 (5/22), Digoxin <0.2 (5/27), Digoxin 0.8 (6/1)  Vital Signs: . Weight: 385 lbs (admission weight: 400 lbs) . Blood pressure: 80/50s . Heart rate: 80s    Medication Assistance / Insurance Benefits Check: Does the patient have prescription insurance?  Yes Type of insurance plan: [employer]  Outpatient Pharmacy:  Prior to admission outpatient [facility] Is the patient willing to use [facility] at discharge? Pending Is the patient willing to transition their outpatient pharmacy to utilize a [facility] outpatient pharmacy?   Pending    Assessment: 1. Acute on chronic systolic CHF (EF 07-22%). NYHA class II/III symptoms. - On diamox 250 mg BID. CO2 down from 40s>34 - consider stopping - Holding furosemide, spironolactone, and losartan with increased SCr and hypotension - On metoprolol XL 25 mg daily - may need to hold this as well if hypotension persists or concern for low output - Holding off on SGLT2i for now - UA positive for E coli, may exacerbate. Consider starting in future if found appropriate. - Holding digoxin with level up to 0.8  on 6/2   Plan: 1) Medication changes recommended at this time: - May need to also hold metoprolol   2) Patient assistance: - [place] Copay: $45/month - Farxiga Copay: $95/month - Unable to complete copay test for Entresto due to recent fill of lisinopril  - Dispo: possible CIR vs SNF  3)  Education  - To be completed prior to discharge  Kerby Nora, PharmD, BCPS Heart Failure Stewardship Pharmacist Phone [phone]  ADDENDUM: noted patient has passed as of 1405 per RN.

## 2021-05-15 NOTE — Progress Notes (Signed)
2021-04-24 called by [facility] to to evaluate for possible pulmonary critical care reconsult due to decrease in life forces.  Remain at the bedside with Dr. Karleen Hampshire as she broached the subject of palliative care.  My opinion was called for and was given and there is little we had to offer at this time as she is a full DNR/DNI and is getting maximal interventions.  He most likely will not survive for much longer with very little to offer him.  Dr. Karleen Hampshire agreed brought up palliative care consult with 2 daughters at the bedside.  General: Morbidly obese somnolent male is only noninvasive mechanical ventilatory support HEENT fullface mask is in place positive JVD Neuro: Somnolent but will arouse and answer yes and no CV: Heart sounds are irregular with atrial fib at a rate of 100 with adequate blood pressure PULM: Decreased breath sounds throughout currently on noninvasive mechanical dilatory support with FiO2 100% decreased to 70 which most likely can be decreased further as long as sats are greater than 90% Vent noninvasive mechanical ventilatory support FIO2 70 PEEP RATE 814 VT 8 spontaneous tidal volumes approximately 400 450 cc  GI: soft, bsx4 active morbidly obese Extremities: warm/dry, anasarca edema with lower extremity wraps Skin: no rashes or lesions   Currently is a DNR palliative care is being consulted nothing for critical care to offer at this time.  Feel free to call pulmonary critical care as needed.  Near end-of-life secondary to multiple health issues.  Richardson Landry Charvez Voorhies ACNP Acute Care Nurse Practitioner [place] Please consult Amion 04-24-2021, 9:07 AM

## 2021-05-15 NOTE — Progress Notes (Addendum)
Progress Note  Patient Name: Jose Fitzpatrick Date of Encounter: Apr 25, 2021  [facility] Cardiologist: Kirk Ruths, MD   Subjective   Patient lethargic this morning and much less responsive.  He is unable to answer whether he is having dyspnea or chest pain.  Inpatient Medications    Scheduled Meds: . acetaZOLAMIDE  250 mg Oral BID  . amiodarone  400 mg Oral Daily  . apixaban  5 mg Oral BID  . Chlorhexidine Gluconate Cloth  6 each Topical Daily  . folic acid  1 mg Oral Daily  . furosemide  80 mg Intravenous BID  . gabapentin  600 mg Oral QPM  . losartan  25 mg Oral Daily  . mouth rinse  15 mL Mouth Rinse BID  . metoprolol succinate  25 mg Oral Daily  . multivitamin with minerals  1 tablet Oral Daily  . potassium chloride  40 mEq Oral BID  . sodium chloride flush  10-40 mL Intracatheter Q12H  . spironolactone  25 mg Oral Daily  . testosterone cypionate  200 mg Intramuscular Q14 Days  . thiamine  100 mg Oral Daily   Or  . thiamine  100 mg Intravenous Daily  . vitamin B-12  1,000 mcg Oral Daily  . Zinc Oxide   Topical TID   Continuous Infusions: . sodium chloride 500 mL (04/10/21 2133)  . sodium chloride     PRN Meds: sodium chloride, Place/Maintain arterial line **AND** sodium chloride, acetaminophen, docusate sodium, polyethylene glycol, sodium chloride flush   Vital Signs    Vitals:   04/16/21 1544 04/16/21 2004 04/16/21 2306 04-25-21 0438  BP:  109/62 108/77 114/73  Pulse:  88 100 99  Resp: 19 19 20 18   Temp: (!) 97.3 F (36.3 C) (!) 97.3 F (36.3 C) (!) 97.4 F (36.3 C) (!) 97.4 F (36.3 C)  TempSrc: Axillary Axillary Axillary Axillary  SpO2: 100% 97% 91% 95%  Weight:    (!) 174.7 kg  Height:        Intake/Output Summary (Last 24 hours) at 04/25/21 0716 Last data filed at 04/16/2021 2313 Gross per 24 hour  Intake 93.6 ml  Output 400 ml  Net -306.4 ml   Last 3 Weights 04/25/21 04/16/2021 04/15/2021  Weight (lbs) 385 lb 2.3 oz 382 lb 4.4 oz 381  lb 2.8 oz  Weight (kg) 174.7 kg 173.4 kg 172.9 kg      Telemetry    Atrial fibrillation; rate elevated- Personally Reviewed   Physical Exam   GEN: Obese, lethargic, less responsive Neck:  Difficult to assess JVD Cardiac: irregular Respiratory: CTA anteriorly GI: Soft, NT/ND, no masses MS: chronic skin changes; 2+ edema Neuro:   Lethargic, question right eye deviation, squeezes with both hands.   Labs    High Sensitivity Troponin:   Recent Labs  Lab 03/29/2021 2023 03/29/2021 2147  TROPONINIHS 12 12      Chemistry Recent Labs  Lab 04/14/21 0516 04/16/21 0504 2021/04/25 0447  NA 133* 132* 132*  K 4.3 4.6 5.4*  CL 90* 88* 90*  CO2 36* 37* 34*  GLUCOSE 111* 122* 141*  BUN 28* 37* 50*  CREATININE 1.03 1.34* 2.28*  CALCIUM 8.1* 8.7* 8.6*  GFRNONAA >60 55* 29*  ANIONGAP 7 7 8      Hematology Recent Labs  Lab 04/14/21 0516  WBC 8.1  RBC 4.89  HGB 16.0  HCT 49.6  MCV 101.4*  MCH 32.7  MCHC 32.3  RDW 13.2  PLT 289  Patient Profile     Jose Maciolek Carpenteris a 75 y.o.malewith hypertension, morbid obesity, chronic venous insufficiency, possible alcohol abuse who was admitted on 03/17/2021 with acute decompensated systolic heart failure. Found to be in atrial flutter as well as afib with RVR.Patient had TEE guided cardioversion on May 16 (ejection fraction 25 to 30%, moderate RV dysfunction, moderate left atrial enlargement, moderate right atrial enlargement, mild mitral regurgitation).  He did not hold sinus rhythm.  Had repeat cardioversion May 23 but atrial fibrillation again recurred.  Assessment & Plan    1 acute altered mental status-patient lethargic this morning markedly different compared to previous.  Question right eye deviation.  Etiology of events unclear though this apparently occurred in the past 1 hour.  His saturations are in the high 80s to 90 range.  We will check stat ABG (?CO2 narcosis) and chest x-ray.  May also need head CT to rule out  bleed or CVA.  I called his granddaughter Lenna Sciara and updated her.  I confirmed that the patient is a no CODE BLUE including no intubation, no CPR and no defibrillation.  2 acute systolic congestive heart failure-patient remains volume overloaded on exam; however renal function is much worse.  We will hold losartan, Lasix and spironolactone.  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  We will continue Toprol.  Discontinue ARB given acute renal insufficiency.  4 atrial fibrillation/flutter-heart rate is improved.  Continue amiodarone and Toprol.  Continue apixaban.  Plan had been to proceed with cardioversion this morning but patient is markedly different with altered mental status.  Procedure will be canceled.  5 acute kidney disease-renal function much worse today.  We will hold Lasix, spironolactone and losartan.  6 hyperkalemia-we will need to follow closely.  May need kayexalate.  Prognosis poor.  Addendum-saturations continued to decline.  I have asked respiratory therapy to initiate BiPAP.  CRITICAL CARE Performed by: Kirk Ruths   Total critical care time: 45 minutes  Critical care time was exclusive of separately billable procedures and treating other patients.  Critical care was necessary to treat or prevent imminent or life-threatening deterioration.  Critical care was time spent personally by me on the following activities: development of treatment plan with patient and/or surrogate as well as nursing, discussions with consultants, evaluation of patient's response to treatment, examination of patient, obtaining history from patient or surrogate, ordering and performing treatments and interventions, ordering and review of laboratory studies, ordering and review of radiographic studies, pulse oximetry and re-evaluation of patient's condition.   For questions or updates, please contact [place] Please consult [email or web address] for contact info  under        Signed, Kirk Ruths, MD  April 30, 2021, 7:16 AM

## 2021-05-15 NOTE — Progress Notes (Signed)
Unable to draw ABG on patient.  Attempted x 3.  Small amount drawn sent to lab however, unable to analyze due to small sample.

## 2021-05-15 DEATH — deceased

## 2021-07-13 ENCOUNTER — Other Ambulatory Visit (HOSPITAL_COMMUNITY): Payer: Self-pay

## 2022-05-16 IMAGING — CR DG CHEST 2V
2 series · 2 of 2 positions shown · non-contrast
Comparison: None.

CLINICAL DATA: Chest pain history colon cancer

EXAM:
CHEST - 2 VIEW

[w chest lat]
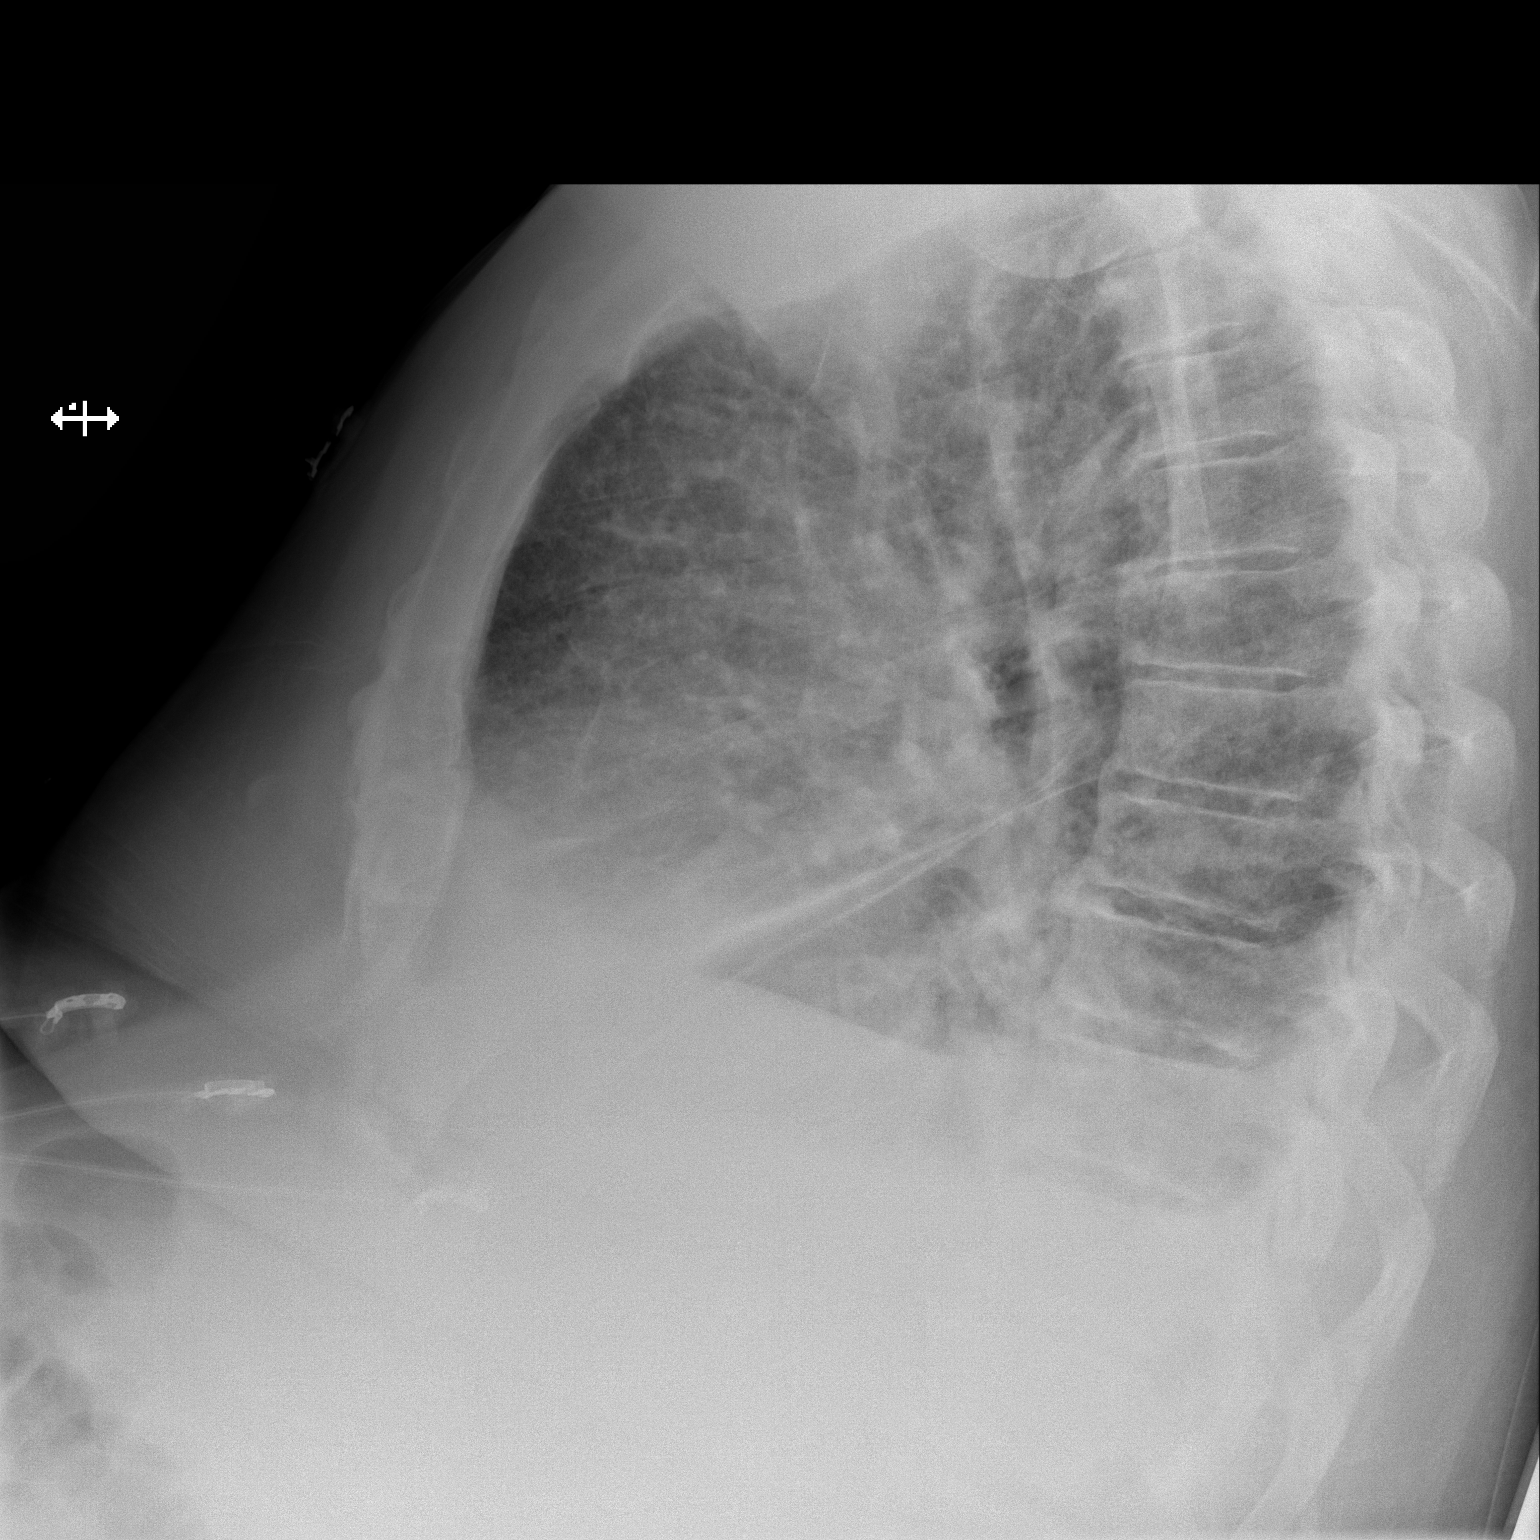

[x chest ap]
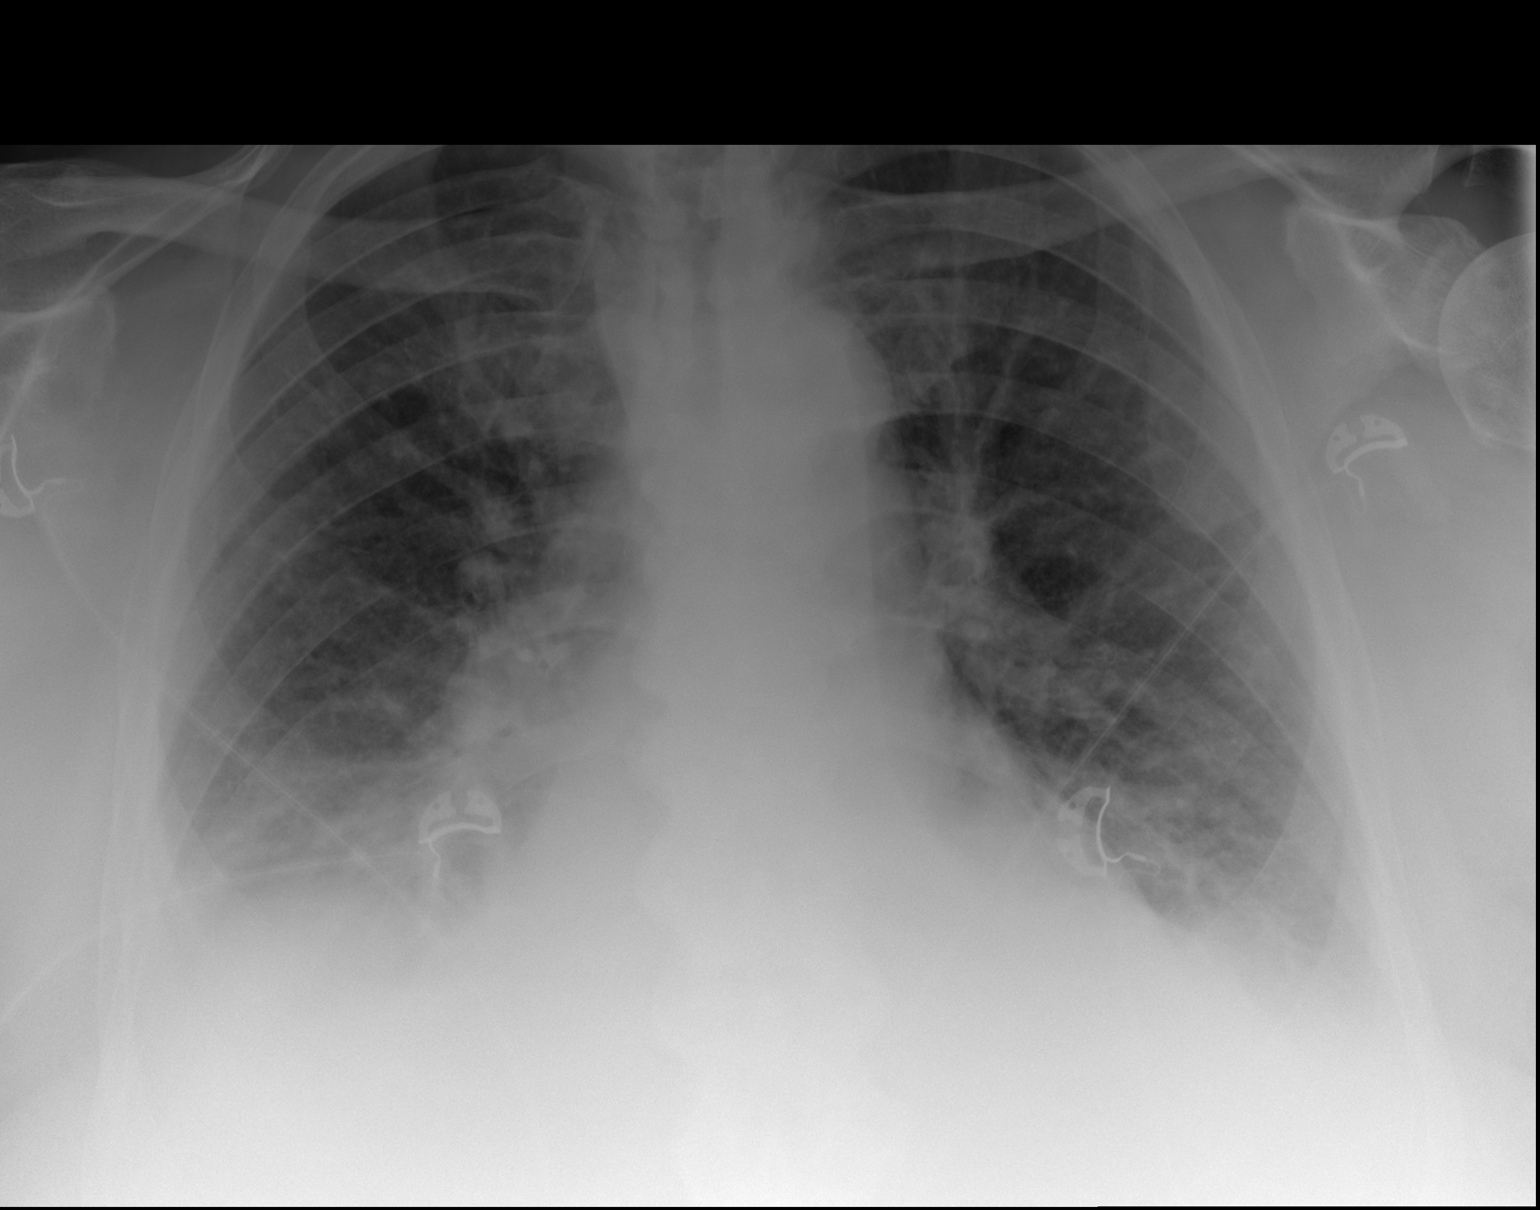

[2 of 2 positions shown; findings below may reference images not displayed]

FINDINGS: Small bilateral effusions with basilar airspace disease. Mild
cardiomegaly with vascular congestion and interstitial pulmonary
edema. No pneumothorax
IMPRESSION: Mild cardiomegaly with vascular congestion, interstitial pulmonary
edema and small bilateral effusions.

## 2022-05-27 IMAGING — DX DG CHEST 1V PORT
2 series · 2 of 2 positions shown · non-contrast
Comparison: March 24, 2021

CLINICAL DATA: Hypertensive.  Shortness of breath.

EXAM:
PORTABLE CHEST 1 VIEW

[chest ap (1 of 2)]
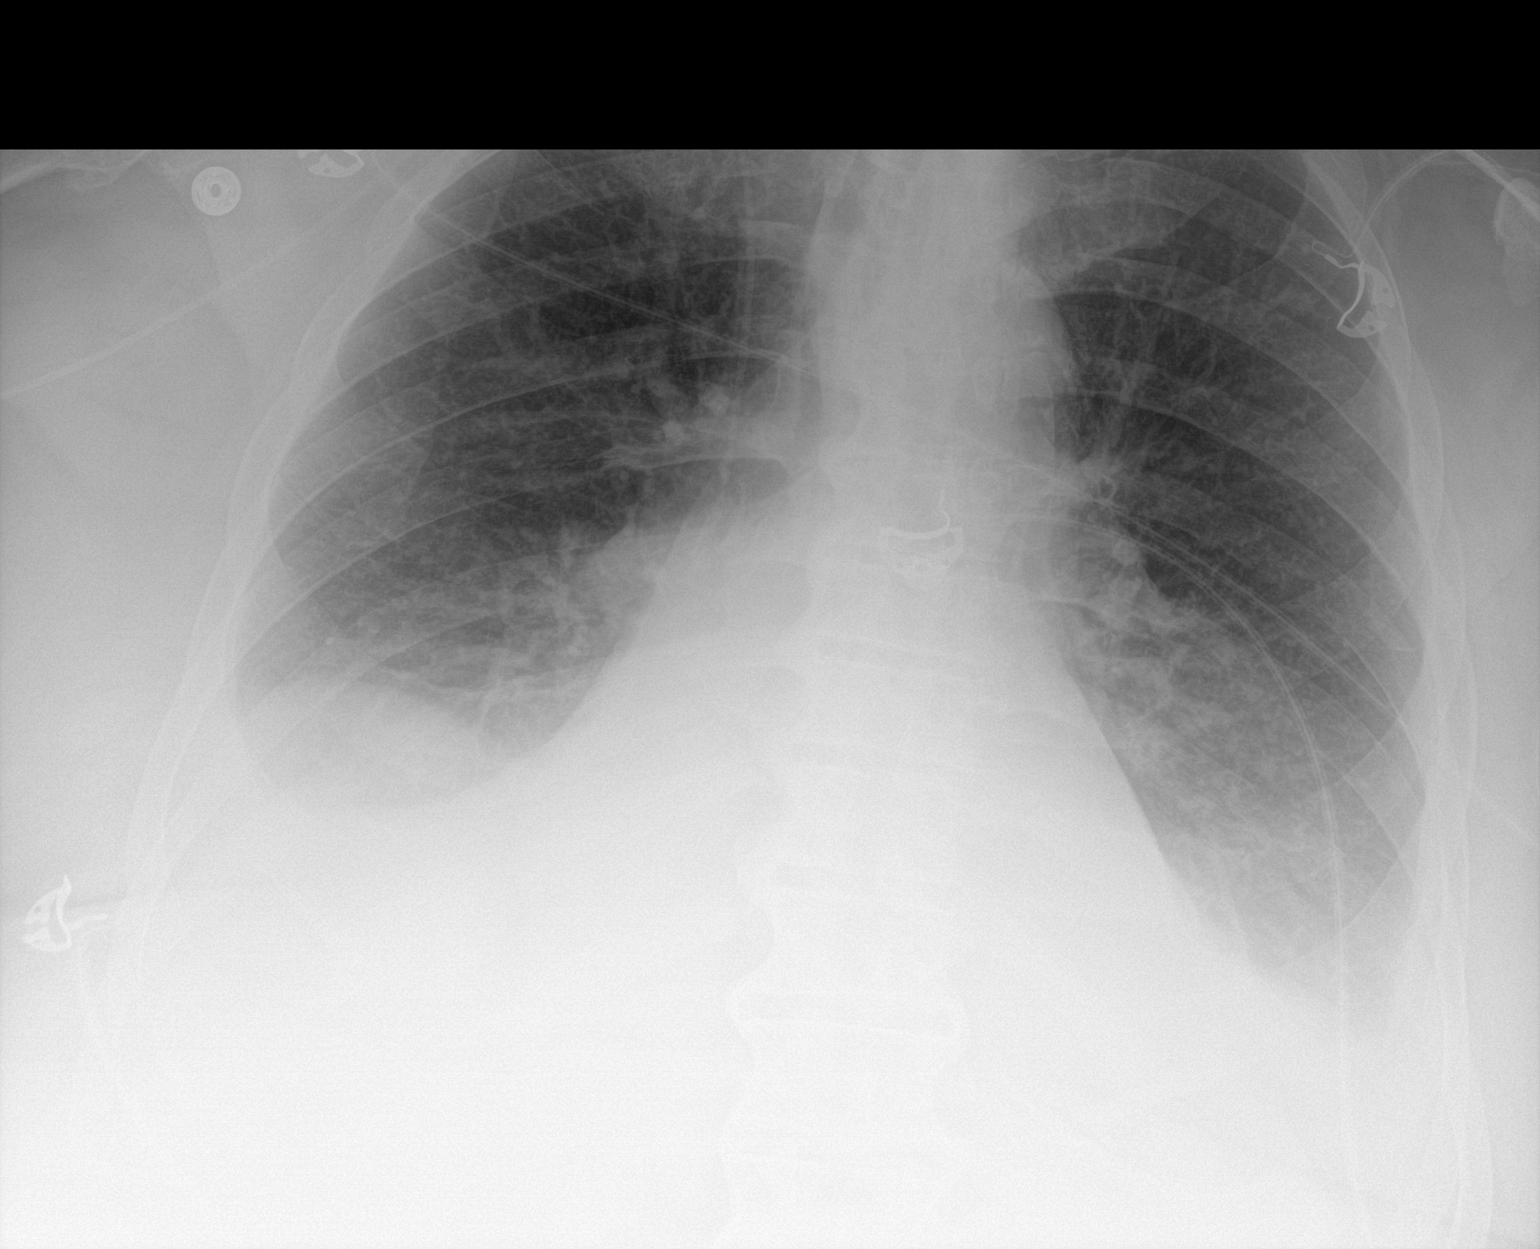

[chest ap (2 of 2)]
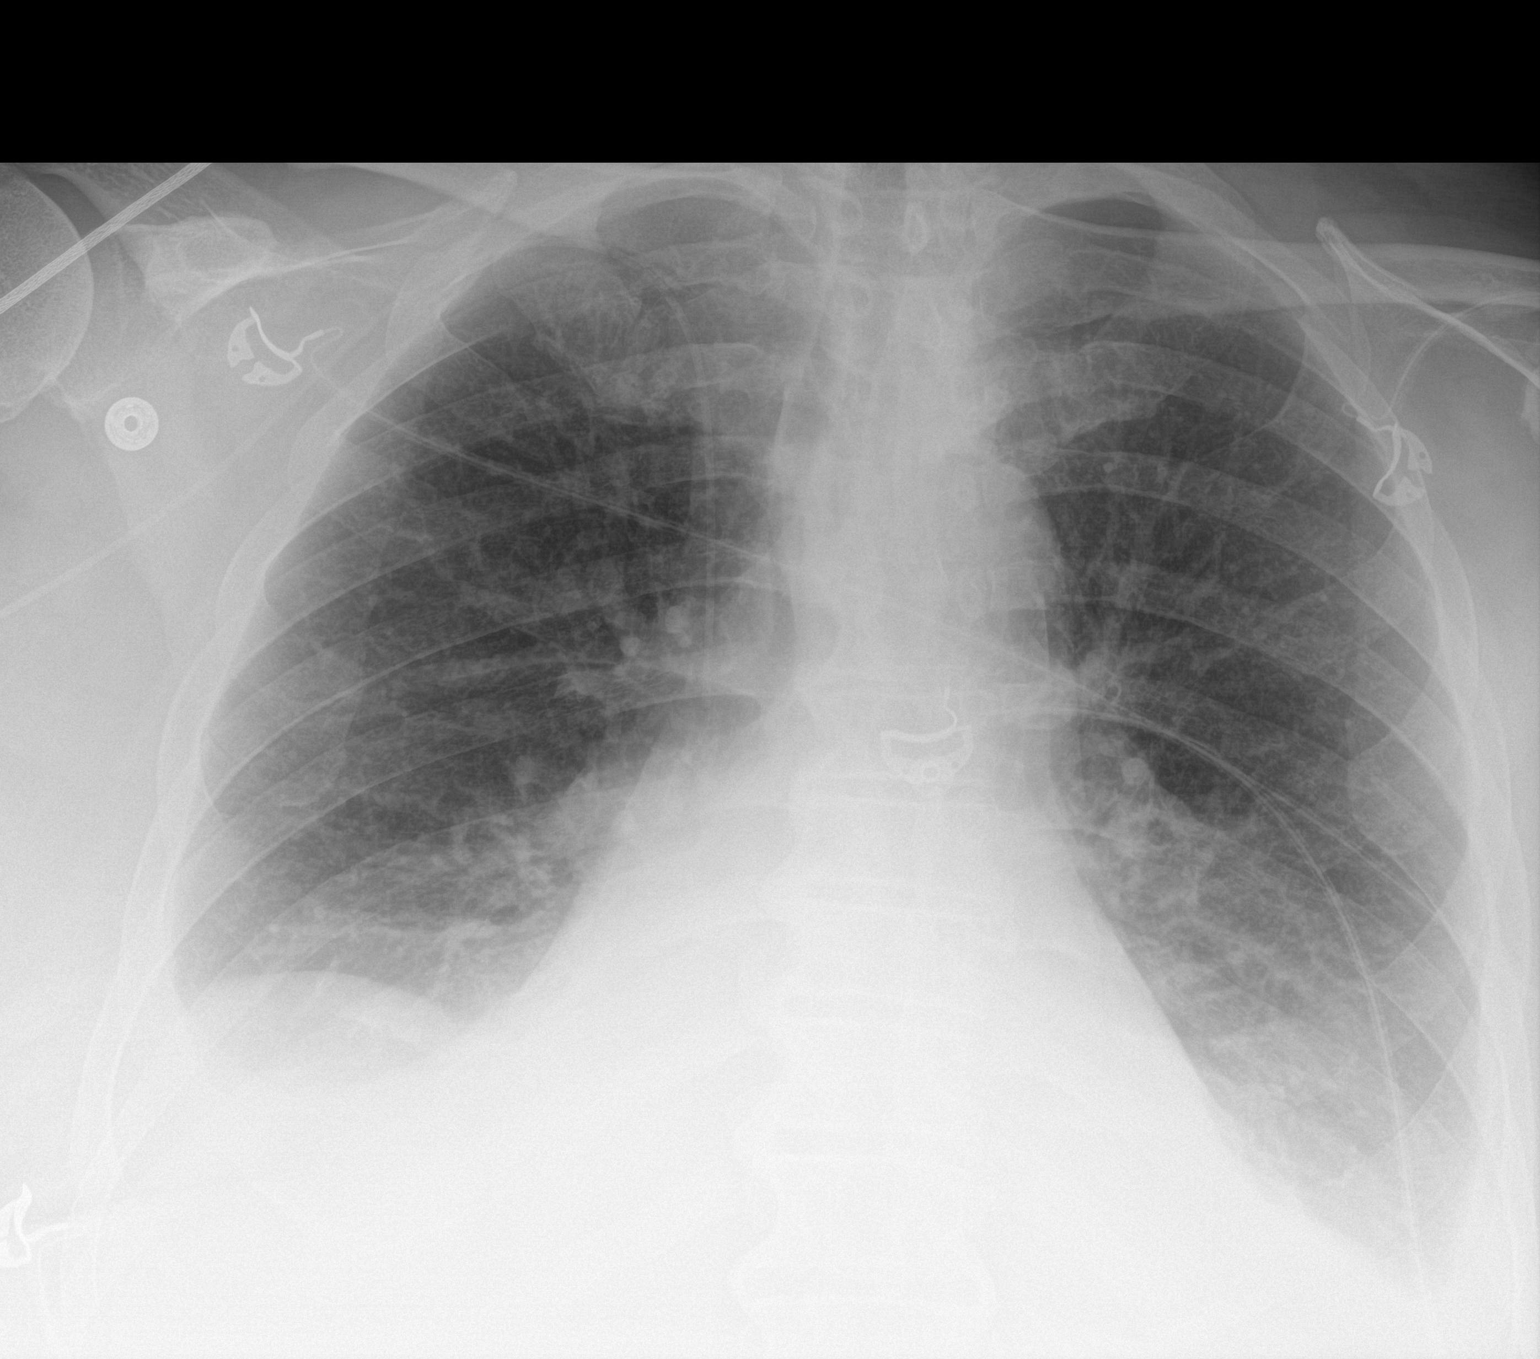

[2 of 2 positions shown; findings below may reference images not displayed]

FINDINGS: Stable cardiomegaly. The hila and mediastinum are unchanged.
Bilateral pleural effusions with underlying atelectasis. A new right
PICC line terminates in the central SVC. No other acute
abnormalities.
IMPRESSION: 1. A new right PICC line terminates in the central SVC. No
pneumothorax.
2. Small bilateral pleural effusions with underlying atelectasis.

## 2022-05-30 IMAGING — DX DG CHEST 1V PORT
1 series · 1 of 1 positions shown · non-contrast
Comparison: April 04, 2021.

CLINICAL DATA: Dyspnea.

EXAM:
PORTABLE CHEST 1 VIEW

[chest ap]
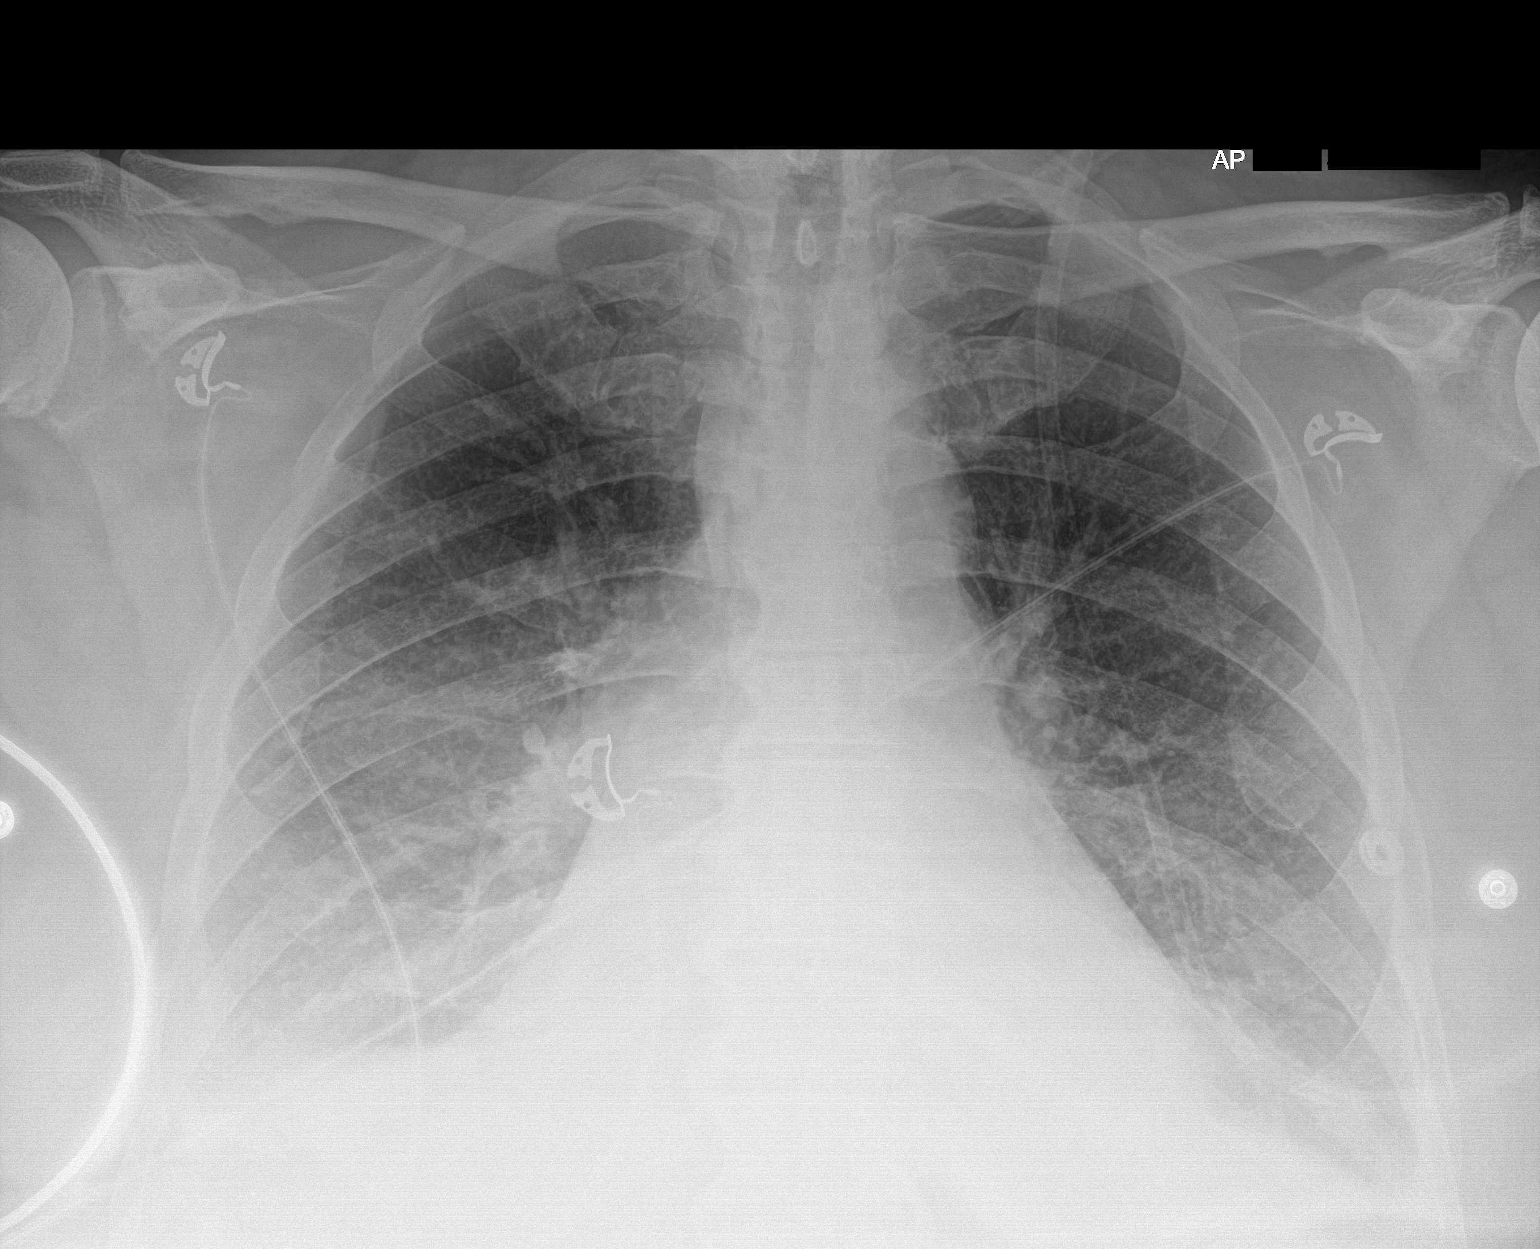

[1 of 1 positions shown; findings below may reference images not displayed]

FINDINGS: Stable cardiomediastinal silhouette. No pneumothorax is noted.
Bibasilar interstitial densities are noted consistent with
subsegmental atelectasis or edema with associated small pleural
effusions. Bony thorax is unremarkable.
IMPRESSION: Bibasilar subsegmental atelectasis or edema is noted with associated
small pleural effusions.
